# Patient Record
Sex: Female | Born: 1945 | Race: Black or African American | Hispanic: No | Smoking: Never smoker
Health system: Southern US, Community
[De-identification: ages and names within clinical notes are randomized; demographics above are authoritative.]

## PROBLEM LIST (undated history)

## (undated) DIAGNOSIS — S86811D Strain of other muscle(s) and tendon(s) at lower leg level, right leg, subsequent encounter: Secondary | ICD-10-CM

## (undated) DIAGNOSIS — F329 Major depressive disorder, single episode, unspecified: Secondary | ICD-10-CM

## (undated) DIAGNOSIS — I10 Essential (primary) hypertension: Secondary | ICD-10-CM

## (undated) DIAGNOSIS — M1990 Unspecified osteoarthritis, unspecified site: Secondary | ICD-10-CM

## (undated) DIAGNOSIS — G629 Polyneuropathy, unspecified: Secondary | ICD-10-CM

## (undated) DIAGNOSIS — K649 Unspecified hemorrhoids: Secondary | ICD-10-CM

## (undated) DIAGNOSIS — K219 Gastro-esophageal reflux disease without esophagitis: Secondary | ICD-10-CM

## (undated) DIAGNOSIS — E7800 Pure hypercholesterolemia, unspecified: Secondary | ICD-10-CM

## (undated) DIAGNOSIS — D638 Anemia in other chronic diseases classified elsewhere: Secondary | ICD-10-CM

## (undated) DIAGNOSIS — Z87442 Personal history of urinary calculi: Secondary | ICD-10-CM

## (undated) DIAGNOSIS — F32A Depression, unspecified: Secondary | ICD-10-CM

## (undated) DIAGNOSIS — N289 Disorder of kidney and ureter, unspecified: Secondary | ICD-10-CM

## (undated) HISTORY — PX: CATARACT EXTRACTION: SUR2

## (undated) HISTORY — PX: ABDOMINAL HYSTERECTOMY: SHX81

## (undated) HISTORY — PX: BREAST SURGERY: SHX581

## (undated) HISTORY — DX: Strain of other muscle(s) and tendon(s) at lower leg level, right leg, subsequent encounter: S86.811D

## (undated) HISTORY — PX: OTHER SURGICAL HISTORY: SHX169

## (undated) HISTORY — PX: EYE SURGERY: SHX253

## (undated) HISTORY — PX: COLONOSCOPY: SHX174

## (undated) HISTORY — DX: Polyneuropathy, unspecified: G62.9

---
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
## 1993-09-14 HISTORY — PX: KNEE ARTHROSCOPY: SUR90

## 1998-02-26 ENCOUNTER — Emergency Department (HOSPITAL_COMMUNITY): Admission: EM | Admit: 1998-02-26 | Discharge: 1998-02-26 | Payer: Self-pay | Admitting: Emergency Medicine

## 1998-02-27 ENCOUNTER — Encounter: Admission: RE | Admit: 1998-02-27 | Discharge: 1998-05-28 | Payer: Self-pay | Admitting: Internal Medicine

## 1998-02-27 ENCOUNTER — Encounter: Admission: RE | Admit: 1998-02-27 | Discharge: 1998-02-27 | Payer: Self-pay | Admitting: *Deleted

## 1998-08-27 ENCOUNTER — Other Ambulatory Visit: Admission: RE | Admit: 1998-08-27 | Discharge: 1998-08-27 | Payer: Self-pay | Admitting: Obstetrics & Gynecology

## 1998-10-14 ENCOUNTER — Emergency Department (HOSPITAL_COMMUNITY): Admission: EM | Admit: 1998-10-14 | Discharge: 1998-10-14 | Payer: Self-pay | Admitting: Emergency Medicine

## 1998-10-17 ENCOUNTER — Emergency Department (HOSPITAL_COMMUNITY): Admission: EM | Admit: 1998-10-17 | Discharge: 1998-10-17 | Payer: Self-pay | Admitting: Emergency Medicine

## 1999-10-02 ENCOUNTER — Ambulatory Visit (HOSPITAL_COMMUNITY): Admission: RE | Admit: 1999-10-02 | Discharge: 1999-10-02 | Payer: Self-pay | Admitting: *Deleted

## 1999-10-02 ENCOUNTER — Encounter (INDEPENDENT_AMBULATORY_CARE_PROVIDER_SITE_OTHER): Payer: Self-pay | Admitting: Specialist

## 2003-01-15 ENCOUNTER — Encounter: Payer: Self-pay | Admitting: Emergency Medicine

## 2003-01-15 ENCOUNTER — Emergency Department (HOSPITAL_COMMUNITY): Admission: EM | Admit: 2003-01-15 | Discharge: 2003-01-16 | Payer: Self-pay | Admitting: Emergency Medicine

## 2003-11-05 ENCOUNTER — Other Ambulatory Visit: Admission: RE | Admit: 2003-11-05 | Discharge: 2003-11-05 | Payer: Self-pay | Admitting: Obstetrics & Gynecology

## 2004-02-17 ENCOUNTER — Emergency Department (HOSPITAL_COMMUNITY): Admission: EM | Admit: 2004-02-17 | Discharge: 2004-02-17 | Payer: Self-pay | Admitting: Emergency Medicine

## 2004-12-23 ENCOUNTER — Encounter: Admission: RE | Admit: 2004-12-23 | Discharge: 2004-12-23 | Payer: Self-pay | Admitting: Family Medicine

## 2006-05-19 ENCOUNTER — Encounter: Admission: RE | Admit: 2006-05-19 | Discharge: 2006-05-19 | Payer: Self-pay | Admitting: Rheumatology

## 2006-05-21 ENCOUNTER — Encounter: Admission: RE | Admit: 2006-05-21 | Discharge: 2006-05-21 | Payer: Self-pay | Admitting: Rheumatology

## 2006-12-30 ENCOUNTER — Ambulatory Visit (HOSPITAL_COMMUNITY): Admission: RE | Admit: 2006-12-30 | Discharge: 2006-12-30 | Payer: Self-pay | Admitting: Family Medicine

## 2009-04-11 ENCOUNTER — Ambulatory Visit (HOSPITAL_COMMUNITY): Admission: RE | Admit: 2009-04-11 | Discharge: 2009-04-11 | Payer: Self-pay | Admitting: Family Medicine

## 2010-04-01 ENCOUNTER — Other Ambulatory Visit: Admission: RE | Admit: 2010-04-01 | Discharge: 2010-04-01 | Payer: Self-pay | Admitting: Family Medicine

## 2010-04-30 ENCOUNTER — Ambulatory Visit (HOSPITAL_COMMUNITY): Admission: RE | Admit: 2010-04-30 | Discharge: 2010-04-30 | Payer: Self-pay | Admitting: Family Medicine

## 2010-05-12 ENCOUNTER — Emergency Department (HOSPITAL_COMMUNITY): Admission: EM | Admit: 2010-05-12 | Discharge: 2010-05-12 | Payer: Self-pay | Admitting: Emergency Medicine

## 2010-11-28 LAB — RPR
RPR Ser Ql: NONREACTIVE
RPR Ser Ql: NONREACTIVE

## 2010-11-28 LAB — WET PREP, GENITAL: Trich, Wet Prep: NONE SEEN

## 2010-11-28 LAB — URINE MICROSCOPIC-ADD ON

## 2010-11-28 LAB — URINALYSIS, ROUTINE W REFLEX MICROSCOPIC
Hgb urine dipstick: NEGATIVE
Nitrite: NEGATIVE
Protein, ur: NEGATIVE mg/dL
Specific Gravity, Urine: 1.031 — ABNORMAL HIGH (ref 1.005–1.030)
Urobilinogen, UA: 0.2 mg/dL (ref 0.0–1.0)

## 2010-11-28 LAB — GC/CHLAMYDIA PROBE AMP, GENITAL

## 2011-01-13 ENCOUNTER — Emergency Department (HOSPITAL_COMMUNITY): Payer: Medicare Other

## 2011-01-13 ENCOUNTER — Emergency Department (HOSPITAL_COMMUNITY)
Admission: EM | Admit: 2011-01-13 | Discharge: 2011-01-14 | Disposition: A | Discharge status: Home / Self Care | Payer: Medicare Other | Attending: Emergency Medicine | Admitting: Emergency Medicine

## 2011-01-13 DIAGNOSIS — R109 Unspecified abdominal pain: Secondary | ICD-10-CM | POA: Insufficient documentation

## 2011-01-13 DIAGNOSIS — R112 Nausea with vomiting, unspecified: Secondary | ICD-10-CM | POA: Insufficient documentation

## 2011-01-13 DIAGNOSIS — M129 Arthropathy, unspecified: Secondary | ICD-10-CM | POA: Insufficient documentation

## 2011-01-13 LAB — OCCULT BLOOD, POC DEVICE: Fecal Occult Bld: NEGATIVE

## 2011-01-14 LAB — URINALYSIS, ROUTINE W REFLEX MICROSCOPIC
Bilirubin Urine: NEGATIVE
Ketones, ur: 15 mg/dL — AB
Nitrite: NEGATIVE
Protein, ur: NEGATIVE mg/dL
Urobilinogen, UA: 0.2 mg/dL (ref 0.0–1.0)

## 2011-01-14 LAB — CBC
HCT: 40.8 % (ref 36.0–46.0)
MCHC: 33.8 g/dL (ref 30.0–36.0)
MCV: 94.9 fL (ref 78.0–100.0)
Platelets: 175 10*3/uL (ref 150–400)
RDW: 12.7 % (ref 11.5–15.5)

## 2011-01-14 LAB — COMPREHENSIVE METABOLIC PANEL
ALT: 12 U/L (ref 0–35)
Alkaline Phosphatase: 97 U/L (ref 39–117)
CO2: 24 mEq/L (ref 19–32)
Chloride: 100 mEq/L (ref 96–112)
GFR calc non Af Amer: 59 mL/min — ABNORMAL LOW (ref >60)
Glucose, Bld: 97 mg/dL (ref 70–99)
Potassium: 3.7 mEq/L (ref 3.5–5.1)
Sodium: 138 mEq/L (ref 135–145)
Total Bilirubin: 0.5 mg/dL (ref 0.3–1.2)
Total Protein: 8 g/dL (ref 6.0–8.3)

## 2011-01-14 LAB — DIFFERENTIAL
Eosinophils Absolute: 0.1 10*3/uL (ref 0.0–0.7)
Eosinophils Relative: 1 % (ref 0–5)
Lymphocytes Relative: 25 % (ref 12–46)
Lymphs Abs: 1.8 10*3/uL (ref 0.7–4.0)
Monocytes Absolute: 0.7 10*3/uL (ref 0.1–1.0)

## 2011-01-15 LAB — URINE CULTURE: Culture  Setup Time: 201205020855

## 2011-04-15 ENCOUNTER — Other Ambulatory Visit (HOSPITAL_COMMUNITY): Payer: Self-pay | Admitting: Family Medicine

## 2011-04-15 DIAGNOSIS — Z1231 Encounter for screening mammogram for malignant neoplasm of breast: Secondary | ICD-10-CM

## 2011-05-04 ENCOUNTER — Ambulatory Visit (HOSPITAL_COMMUNITY)
Admission: RE | Admit: 2011-05-04 | Discharge: 2011-05-04 | Disposition: A | Discharge status: Home / Self Care | Payer: Medicare Other | Source: Ambulatory Visit | Attending: Family Medicine | Admitting: Family Medicine

## 2011-05-04 DIAGNOSIS — Z1231 Encounter for screening mammogram for malignant neoplasm of breast: Secondary | ICD-10-CM | POA: Insufficient documentation

## 2014-04-10 ENCOUNTER — Other Ambulatory Visit: Payer: Self-pay | Admitting: Internal Medicine

## 2014-04-10 DIAGNOSIS — R1084 Generalized abdominal pain: Secondary | ICD-10-CM

## 2014-04-13 ENCOUNTER — Ambulatory Visit
Admission: RE | Admit: 2014-04-13 | Discharge: 2014-04-13 | Disposition: A | Discharge status: Home / Self Care | Payer: Commercial Managed Care - HMO | Source: Ambulatory Visit | Attending: Internal Medicine | Admitting: Internal Medicine

## 2014-04-13 DIAGNOSIS — R1084 Generalized abdominal pain: Secondary | ICD-10-CM

## 2014-04-13 MED ORDER — IOHEXOL 300 MG/ML  SOLN
100.0000 mL | Freq: Once | INTRAMUSCULAR | Status: AC | PRN
  Administered 2014-04-13: 100 mL via INTRAVENOUS

## 2014-04-25 ENCOUNTER — Other Ambulatory Visit: Payer: Self-pay | Admitting: Urology

## 2014-04-27 ENCOUNTER — Encounter (HOSPITAL_COMMUNITY): Payer: Self-pay | Admitting: Pharmacy Technician

## 2014-04-27 NOTE — Patient Instructions (Addendum)
TEAGHAN MELROSE  04/27/2014   Your procedure is scheduled on:  05/01/2014    Report to [facility].  Follow the Signs to [place] at   1100     am  Call this number if you have problems the morning of surgery: [phone]   Remember:   Do not eat food after midnite.  May have clear liquids until 0630am then npo.     CLEAR LIQUID DIET   Foods Allowed                                                                     Foods Excluded  Coffee and tea, regular and decaf                             liquids that you cannot  Plain Jell-O in any flavor                                             see through such as: Fruit ices (not with fruit pulp)                                     milk, soups, orange juice  Iced Popsicles                                    All solid food Carbonated beverages, regular and diet                                    Cranberry, grape and apple juices Sports drinks like Gatorade Lightly seasoned clear broth or consume(fat free) Sugar, honey syrup  Sample Menu Breakfast                                Lunch                                     Supper Cranberry juice                    Beef broth                            Chicken broth Jell-O                                     Grape juice                           Apple juice Coffee or tea  Jell-O                                      Popsicle                                                Coffee or tea                        Coffee or tea  _____________________________________________________________________    Take these medicines the morning of surgery with A SIP OF WATER:    Do not wear jewelry, make-up or nail polish.  Do not wear lotions, powders, or perfumes. , deodorant   Do not shave 48 hours prior to surgery.   Do not bring valuables to the hospital.  Contacts, dentures or bridgework may not be worn into surgery.       Patients discharged the day of  surgery will not be allowed to drive  home.  Name and phone number of your driver:      Please read over the following fact sheets that you were given: [facility] - Preparing for Surgery Before surgery, you can play an important role.  Because skin is not sterile, your skin needs to be as free of germs as possible.  You can reduce the number of germs on your skin by washing with CHG (chlorahexidine gluconate) soap before surgery.  CHG is an antiseptic cleaner which kills germs and bonds with the skin to continue killing germs even after washing. Please DO NOT use if you have an allergy to CHG or antibacterial soaps.  If your skin becomes reddened/irritated stop using the CHG and inform your nurse when you arrive at Short Stay. Do not shave (including legs and underarms) for at least 48 hours prior to the first CHG shower.  You may shave your face/neck. Please follow these instructions carefully:  1.  Shower with CHG Soap the night before surgery and the  morning of Surgery.  2.  If you choose to wash your hair, wash your hair first as usual with your  normal  shampoo.  3.  After you shampoo, rinse your hair and body thoroughly to remove the  shampoo.                           4.  Use CHG as you would any other liquid soap.  You can apply chg directly  to the skin and wash                       Gently with a scrungie or clean washcloth.  5.  Apply the CHG Soap to your body ONLY FROM THE NECK DOWN.   Do not use on face/ open                           Wound or open sores. Avoid contact with eyes, ears mouth and genitals (private parts).                       Wash face,  Genitals (private parts) with your normal soap.  6.  Wash thoroughly, paying special attention to the area where your surgery  will be performed.  7.  Thoroughly rinse your body with warm water from the neck down.  8.  DO NOT shower/wash with your normal soap after using and rinsing off  the CHG Soap.                9.   Pat yourself dry with a clean towel.            10.  Wear clean pajamas.            11.  Place clean sheets on your bed the night of your first shower and do not  sleep with pets. Day of Surgery : Do not apply any lotions/deodorants the morning of surgery.  Please wear clean clothes to the hospital/surgery center.  FAILURE TO FOLLOW THESE INSTRUCTIONS MAY RESULT IN THE CANCELLATION OF YOUR SURGERY PATIENT SIGNATURE_________________________________  NURSE SIGNATURE__________________________________  ________________________________________________________________________  coughing and deep breathing exercises, leg exercises

## 2014-04-30 ENCOUNTER — Encounter (HOSPITAL_COMMUNITY): Payer: Self-pay

## 2014-04-30 ENCOUNTER — Encounter (HOSPITAL_COMMUNITY)
Admission: RE | Admit: 2014-04-30 | Discharge: 2014-04-30 | Disposition: A | Discharge status: Home / Self Care | Payer: Medicare HMO | Source: Ambulatory Visit | Attending: Urology | Admitting: Urology

## 2014-04-30 DIAGNOSIS — Z01818 Encounter for other preprocedural examination: Secondary | ICD-10-CM | POA: Diagnosis present

## 2014-04-30 DIAGNOSIS — N329 Bladder disorder, unspecified: Secondary | ICD-10-CM | POA: Insufficient documentation

## 2014-04-30 DIAGNOSIS — N1330 Unspecified hydronephrosis: Secondary | ICD-10-CM | POA: Diagnosis present

## 2014-04-30 HISTORY — DX: Gastro-esophageal reflux disease without esophagitis: K21.9

## 2014-04-30 HISTORY — DX: Unspecified hemorrhoids: K64.9

## 2014-04-30 HISTORY — DX: Major depressive disorder, single episode, unspecified: F32.9

## 2014-04-30 HISTORY — DX: Unspecified osteoarthritis, unspecified site: M19.90

## 2014-04-30 HISTORY — DX: Depression, unspecified: F32.A

## 2014-04-30 LAB — BASIC METABOLIC PANEL
Anion gap: 8 (ref 5–15)
BUN: 16 mg/dL (ref 6–23)
CHLORIDE: 103 meq/L (ref 96–112)
CO2: 29 meq/L (ref 19–32)
CREATININE: 0.76 mg/dL (ref 0.50–1.10)
Calcium: 9.2 mg/dL (ref 8.4–10.5)
GFR calc Af Amer: >90 mL/min (ref >90)
GFR calc non Af Amer: 85 mL/min — ABNORMAL LOW (ref >90)
Glucose, Bld: 87 mg/dL (ref 70–99)
Potassium: 4.3 mEq/L (ref 3.7–5.3)
Sodium: 140 mEq/L (ref 137–147)

## 2014-04-30 LAB — CBC
HCT: 39.4 % (ref 36.0–46.0)
Hemoglobin: 12.7 g/dL (ref 12.0–15.0)
MCH: 31.7 pg (ref 26.0–34.0)
MCHC: 32.2 g/dL (ref 30.0–36.0)
MCV: 98.3 fL (ref 78.0–100.0)
PLATELETS: 229 10*3/uL (ref 150–400)
RBC: 4.01 MIL/uL (ref 3.87–5.11)
RDW: 13.6 % (ref 11.5–15.5)
WBC: 6 10*3/uL (ref 4.0–10.5)

## 2014-04-30 NOTE — Progress Notes (Signed)
At time of preop appointment patient was unsure of allergies.  Called and received last office visit note from PCP- Dr Ashby Dawes- by note placed allergies in EPIC.

## 2014-04-30 NOTE — H&P (Signed)
Active Problems Problems  1. Abdominal pain, LLQ (left lower quadrant) (789.04) 2. Bladder mass (596.89) 3. Hydronephrosis, right (591) 4. Pyuria (791.9) 5. Urge incontinence of urine (788.31)  History of Present Illness Brittany Stark returns today after Dr. Ashby Dawes obtained a CT for recurrent abdominal pain that showed mild right hydronephrosis with a thickening of the right bladder wall with perivesical inflammatory changes. When I saw her about 2 weeks ago she had been on antibiotics and her symptoms and microhematuria had resolved.  She now has pain that moves from the left to the right suprapubic area.  She has TNTC WBC's on her UA today but no fecaluria or hematuria.  She reports a recent BM where she fell the stool was coming from the vagina, but none since.  She has had no gross hematuria.  She is on Humira and Prednisone for RA.   Past Medical History Problems  1. History of depression (V11.8) 2. History of esophageal reflux (V12.79) 3. History of hypercholesterolemia (V12.29) 4. History of hypertension (V12.59) 5. History of osteoporosis (V13.59) 6. History of rheumatoid arthritis (V13.4)  Surgical History Problems  1. History of Breast Surgery Reduction Procedure Bilateral 2. History of Cataract Surgery 3. History of Complete Colonoscopy 4. History of Complete Colonoscopy For Polyp Removal 5. History of Hysterectomy 6. History of Knee Arthroscopy 7. History of Tonsillectomy  Current Meds 1. Aspirin 81 MG Oral Tablet;  Therapy: (Recorded:12Aug2015) to Recorded 2. Ciprofloxacin HCl - 500 MG Oral Tablet; Take 1 tablet twice daily;  Therapy: 12Aug2015 to (Complete:19Aug2015)  Requested for: 12Aug2015; Last  Rx:12Aug2015 Ordered 3. Fluconazole 100 MG Oral Tablet;  Therapy: [id number] to Recorded 4. Folic Acid 544 MCG Oral Tablet;  Therapy: (Recorded:12Aug2015) to Recorded 5. Humira 40 MG/0.8ML KIT;  Therapy: (Recorded:15Jul2015) to Recorded 6. Humira Pen 40  MG/0.8ML Subcutaneous Pen-injector Kit;  Therapy: [id number] to Recorded 7. Methotrexate 2.5 MG Oral Tablet;  Therapy: (Recorded:15Jul2015) to Recorded 8. MetroNIDAZOLE 500 MG Oral Tablet; TAKE 1 TABLET TWICE DAILY UNTIL FINISHED;  Therapy: 12Aug2015 to (Evaluate:22Aug2015)  Requested for: 12Aug2015; Last  Rx:12Aug2015 Ordered 9. Multi-Vitamin TABS;  Therapy: (Recorded:15Jul2015) to Recorded 10. Muscle Rub CREA;   Therapy: (Recorded:12Aug2015) to Recorded 11. Omeprazole 20 MG Oral Tablet Delayed Release;   Therapy: (Recorded:15Jul2015) to Recorded 12. Oyster Calcium + D TABS;   Therapy: (Recorded:12Aug2015) to Recorded 13. PredniSONE (Pak) 5 MG Oral Tablet;   Therapy: [id number] to Recorded 14. PredniSONE 5 MG Oral Tablet;   Therapy: (Recorded:15Jul2015) to Recorded  Allergies Medication  1. Amoxicillin TABS 2. Arava 3. Doxycycline Hyclate CAPS  Family History Problems  1. Family history of hypertension (V17.49) : Father 2. Family history of malignant neoplasm (V16.9) : Mother 3. Family history of stroke (V17.1) : Father 4. Family history of systemic lupus erythematosus (V19.4) : Sister   2 sisters  Social History Problems  1. Denied: History of Alcohol use 2. Caffeine use (V49.89) 3. Divorced 4. Never a smoker 5. Number of children   1 son   1 daughter 1. Retired   retired [employer] manager  Review of Systems  Genitourinary: no hematuria.  Gastrointestinal: abdominal pain, but no constipation.  Constitutional: no fever.    Vitals Vital Signs [Data Includes: Last 1 Day]  Recorded: 12Aug2015 09:45AM  Height: 5 ft 9 in Weight: 170 lb  BMI Calculated: 25.1 BSA Calculated: 1.93 Blood Pressure: 147 / 89 Temperature: 97.4 F Heart Rate: 82  Physical Exam Constitutional: Well nourished and well developed . No acute distress.  Pulmonary: No respiratory distress  and normal respiratory rhythm and effort.  Cardiovascular: Heart rate and rhythm are normal . No peripheral edema.   Abdomen: The abdomen is soft and nontender (with the exception of at most mild tenderness in the right SP area. ). No masses are palpated. No CVA tenderness. No hernias are palpable. No hepatosplenomegaly noted.  Genitourinary:. Surprisingly no pelvic mass or induration is noted.  Chaperone Present: .  Examination of the external genitalia shows normal female external genitalia and no lesions. The urethra is normal in appearance and not tender. There is no urethral mass. Vaginal exam demonstrates atrophy and the vaginal epithelium to be poorly estrogenized, but no tenderness. No cystocele is identified. No rectocele is identified. The cervix is is absent. The uterus is absent. The adnexa are palpably normal. The bladder is normal on palpation, non tender, not distended and without masses. The anus is normal on inspection. The perineum is normal on inspection.    Results/Data Urine [Data Includes: Last 1 Day]   12Aug2015  COLOR YELLOW   APPEARANCE CLOUDY   SPECIFIC GRAVITY 1.010   pH 6.5   GLUCOSE NEG mg/dL  BILIRUBIN NEG   KETONE NEG mg/dL  BLOOD TRACE   PROTEIN NEG mg/dL  UROBILINOGEN 0.2 mg/dL  NITRITE POS   LEUKOCYTE ESTERASE SMALL   SQUAMOUS EPITHELIAL/HPF FEW   WBC TNTC WBC/hpf  RBC 0-2 RBC/hpf  BACTERIA MANY   CRYSTALS NONE SEEN   CASTS NONE SEEN    The following images/tracing/specimen were independently visualized:  I have reviewed her CT films and report.  The following clinical lab reports were reviewed:  UA reviewed.    Assessment Assessed  1. Bladder mass (596.89) 2. Hydronephrosis, right (591) 3. Pyuria (791.9) 4. Abdominal pain, LLQ (left lower quadrant) (789.04)  She has right hydro with a mass effect/thickening of the right bladder wall with perivesical inflammatory changes that abutt the small bowel.  Her urine appears infected today.  She has no pneumaturia or fecaluria to suggest a fistula but I am concerned about a perivesical inflammatory process that  maybe somewhat muted by the Humira and Prednisone.   Plan Urine culture today.  I am going to get her set up for an outpatient cystoscopy with right retrograde and possible stenting.   A GI or general surgery consult may be required depending on my findings.   Discussion/Summary CC :Dr. Merrilee Seashore.

## 2014-04-30 NOTE — Progress Notes (Signed)
Requested list of allergies  From Dr Ashby Dawes office.

## 2014-05-01 ENCOUNTER — Encounter (HOSPITAL_COMMUNITY)
Admission: RE | Disposition: A | Discharge status: Home / Self Care | Payer: Self-pay | Source: Ambulatory Visit | Attending: Urology

## 2014-05-01 ENCOUNTER — Encounter (HOSPITAL_COMMUNITY): Payer: Medicare HMO | Admitting: Anesthesiology

## 2014-05-01 ENCOUNTER — Ambulatory Visit (HOSPITAL_COMMUNITY)
Admission: RE | Admit: 2014-05-01 | Discharge: 2014-05-01 | Disposition: A | Discharge status: Home / Self Care | Payer: Medicare HMO | Source: Ambulatory Visit | Attending: Urology | Admitting: Urology

## 2014-05-01 ENCOUNTER — Encounter (HOSPITAL_COMMUNITY): Payer: Self-pay | Admitting: *Deleted

## 2014-05-01 ENCOUNTER — Ambulatory Visit (HOSPITAL_COMMUNITY): Payer: Medicare HMO | Admitting: Anesthesiology

## 2014-05-01 DIAGNOSIS — N3090 Cystitis, unspecified without hematuria: Secondary | ICD-10-CM | POA: Diagnosis present

## 2014-05-01 DIAGNOSIS — Z7982 Long term (current) use of aspirin: Secondary | ICD-10-CM | POA: Insufficient documentation

## 2014-05-01 DIAGNOSIS — IMO0002 Reserved for concepts with insufficient information to code with codable children: Secondary | ICD-10-CM | POA: Insufficient documentation

## 2014-05-01 DIAGNOSIS — Z79899 Other long term (current) drug therapy: Secondary | ICD-10-CM | POA: Insufficient documentation

## 2014-05-01 DIAGNOSIS — N329 Bladder disorder, unspecified: Secondary | ICD-10-CM | POA: Insufficient documentation

## 2014-05-01 DIAGNOSIS — M069 Rheumatoid arthritis, unspecified: Secondary | ICD-10-CM | POA: Insufficient documentation

## 2014-05-01 DIAGNOSIS — F329 Major depressive disorder, single episode, unspecified: Secondary | ICD-10-CM | POA: Diagnosis not present

## 2014-05-01 DIAGNOSIS — F3289 Other specified depressive episodes: Secondary | ICD-10-CM | POA: Diagnosis not present

## 2014-05-01 DIAGNOSIS — K219 Gastro-esophageal reflux disease without esophagitis: Secondary | ICD-10-CM | POA: Diagnosis not present

## 2014-05-01 DIAGNOSIS — N1330 Unspecified hydronephrosis: Secondary | ICD-10-CM | POA: Diagnosis present

## 2014-05-01 HISTORY — PX: CYSTOSCOPY WITH BIOPSY: SHX5122

## 2014-05-01 HISTORY — PX: CYSTOSCOPY W/ RETROGRADES: SHX1426

## 2014-05-01 SURGERY — CYSTOSCOPY, WITH RETROGRADE PYELOGRAM
Anesthesia: General | Site: Ureter | Laterality: Right

## 2014-05-01 MED ORDER — PROPOFOL 10 MG/ML IV BOLUS
INTRAVENOUS | Status: DC | PRN
  Administered 2014-05-01: 150 mg via INTRAVENOUS

## 2014-05-01 MED ORDER — OXYCODONE HCL 5 MG/5ML PO SOLN: 5.0000 mg | Freq: Once | ORAL | Status: DC | PRN

## 2014-05-01 MED ORDER — HYDROMORPHONE HCL PF 1 MG/ML IJ SOLN
0.2500 mg | INTRAMUSCULAR | Status: DC | PRN
Start: 2014-05-01 — End: 2014-05-01

## 2014-05-01 MED ORDER — FENTANYL CITRATE 0.05 MG/ML IJ SOLN
INTRAMUSCULAR | Status: AC
  Filled 2014-05-01: qty 2

## 2014-05-01 MED ORDER — PROPOFOL 10 MG/ML IV BOLUS
INTRAVENOUS | Status: AC
  Filled 2014-05-01: qty 20

## 2014-05-01 MED ORDER — ACETAMINOPHEN 650 MG RE SUPP
650.0000 mg | RECTAL | Status: DC | PRN
Start: 2014-05-01 — End: 2014-05-01
  Filled 2014-05-01: qty 1

## 2014-05-01 MED ORDER — FENTANYL CITRATE 0.05 MG/ML IJ SOLN
INTRAMUSCULAR | Status: DC | PRN
  Administered 2014-05-01: 50 ug via INTRAVENOUS

## 2014-05-01 MED ORDER — SODIUM CHLORIDE 0.9 % IJ SOLN: 3.0000 mL | Freq: Two times a day (BID) | INTRAMUSCULAR | Status: DC

## 2014-05-01 MED ORDER — PROMETHAZINE HCL 25 MG/ML IJ SOLN
6.2500 mg | INTRAMUSCULAR | Status: DC | PRN
Start: 2014-05-01 — End: 2014-05-01

## 2014-05-01 MED ORDER — SODIUM CHLORIDE 0.9 % IV SOLN: 250.0000 mL | INTRAVENOUS | Status: DC | PRN

## 2014-05-01 MED ORDER — CIPROFLOXACIN HCL 500 MG PO TABS: 500.0000 mg | ORAL_TABLET | Freq: Two times a day (BID) | ORAL | Status: DC

## 2014-05-01 MED ORDER — MIDAZOLAM HCL 2 MG/2ML IJ SOLN
INTRAMUSCULAR | Status: AC
  Filled 2014-05-01: qty 2

## 2014-05-01 MED ORDER — IOHEXOL 300 MG/ML  SOLN
INTRAMUSCULAR | Status: DC | PRN
  Administered 2014-05-01: 10 mL via INTRAVENOUS

## 2014-05-01 MED ORDER — OXYCODONE HCL 5 MG PO TABS: 5.0000 mg | ORAL_TABLET | ORAL | Status: DC | PRN

## 2014-05-01 MED ORDER — MEPERIDINE HCL 50 MG/ML IJ SOLN: 6.2500 mg | INTRAMUSCULAR | Status: DC | PRN

## 2014-05-01 MED ORDER — STERILE WATER FOR IRRIGATION IR SOLN
Status: DC | PRN
  Administered 2014-05-01: 3000 mL

## 2014-05-01 MED ORDER — CIPROFLOXACIN IN D5W 400 MG/200ML IV SOLN
INTRAVENOUS | Status: AC
  Filled 2014-05-01: qty 200

## 2014-05-01 MED ORDER — SODIUM CHLORIDE 0.9 % IJ SOLN: 3.0000 mL | INTRAMUSCULAR | Status: DC | PRN

## 2014-05-01 MED ORDER — LACTATED RINGERS IV SOLN
INTRAVENOUS | Status: DC
  Administered 2014-05-01: 1000 mL via INTRAVENOUS

## 2014-05-01 MED ORDER — FENTANYL CITRATE 0.05 MG/ML IJ SOLN: 25.0000 ug | INTRAMUSCULAR | Status: DC | PRN

## 2014-05-01 MED ORDER — OXYCODONE HCL 5 MG PO TABS: 5.0000 mg | ORAL_TABLET | Freq: Once | ORAL | Status: DC | PRN

## 2014-05-01 MED ORDER — CIPROFLOXACIN IN D5W 400 MG/200ML IV SOLN
400.0000 mg | INTRAVENOUS | Status: AC
  Administered 2014-05-01: 400 mg via INTRAVENOUS

## 2014-05-01 MED ORDER — MIDAZOLAM HCL 5 MG/5ML IJ SOLN
INTRAMUSCULAR | Status: DC | PRN
  Administered 2014-05-01: 2 mg via INTRAVENOUS

## 2014-05-01 MED ORDER — ACETAMINOPHEN 325 MG PO TABS
650.0000 mg | ORAL_TABLET | ORAL | Status: DC | PRN
Start: 2014-05-01 — End: 2014-05-01

## 2014-05-01 SURGICAL SUPPLY — 15 items
BAG URO CATCHER STRL LF (DRAPE) ×3 IMPLANT
CATH URET 5FR 28IN OPEN ENDED (CATHETERS) ×3 IMPLANT
CLOTH BEACON ORANGE TIMEOUT ST (SAFETY) ×3 IMPLANT
DRAPE CAMERA CLOSED 9X96 (DRAPES) ×3 IMPLANT
ELECT REM PT RETURN 9FT ADLT (ELECTROSURGICAL) ×3
ELECTRODE REM PT RTRN 9FT ADLT (ELECTROSURGICAL) ×2 IMPLANT
GLOVE SURG SS PI 6.0 STRL IVOR (GLOVE) ×3 IMPLANT
GLOVE SURG SS PI 8.0 STRL IVOR (GLOVE) ×3 IMPLANT
GOWN STRL REUS W/ TWL LRG LVL3 (GOWN DISPOSABLE) ×4 IMPLANT
GOWN STRL REUS W/TWL LRG LVL3 (GOWN DISPOSABLE) ×6
GOWN STRL REUS W/TWL XL LVL3 (GOWN DISPOSABLE) ×3 IMPLANT
MANIFOLD NEPTUNE II (INSTRUMENTS) ×3 IMPLANT
PACK CYSTO (CUSTOM PROCEDURE TRAY) ×3 IMPLANT
TUBING CONNECTING 10 (TUBING) ×3 IMPLANT
WATER STERILE IRR 3000ML UROMA (IV SOLUTION) ×3 IMPLANT

## 2014-05-01 NOTE — Interval H&P Note (Signed)
History and Physical Interval Note:  Her culture was positive but sensitive to the Cipro she was given.   05/01/2014 12:23 PM  [name]  has presented today for surgery, with the diagnosis of bladder lesions and right hydronephrosis  The various methods of treatment have been discussed with the patient and family. After consideration of risks, benefits and other options for treatment, the patient has consented to  Procedure(s): CYSTOSCOPY WITH RETROGRADE PYELOGRAM/POSSIBLE BIOPSY/POSSIBLE RIGHT STENT (Right) as a surgical intervention .  The patient's history has been reviewed, patient examined, no change in status, stable for surgery.  I have reviewed the patient's chart and labs.  Questions were answered to the patient's satisfaction.     Brittany Stark J

## 2014-05-01 NOTE — Anesthesia Preprocedure Evaluation (Signed)
Anesthesia Evaluation  Patient identified by MRN, date of birth, ID band Patient awake    Reviewed: Allergy & Precautions, H&P , NPO status , Patient's Chart, lab work & pertinent test results  Airway Mallampati: II TM Distance: >3 FB Neck ROM: Full    Dental no notable dental hx.    Pulmonary neg pulmonary ROS,  breath sounds clear to auscultation  Pulmonary exam normal       Cardiovascular negative cardio ROS  Rhythm:Regular Rate:Normal     Neuro/Psych PSYCHIATRIC DISORDERS Depression negative neurological ROS     GI/Hepatic Neg liver ROS, GERD-  Medicated,  Endo/Other  negative endocrine ROS  Renal/GU negative Renal ROS     Musculoskeletal negative musculoskeletal ROS (+)   Abdominal   Peds  Hematology negative hematology ROS (+)   Anesthesia Other Findings   Reproductive/Obstetrics negative OB ROS                           Anesthesia Physical Anesthesia Plan  ASA: II  Anesthesia Plan: General   Post-op Pain Management:    Induction: Intravenous  Airway Management Planned: LMA  Additional Equipment:   Intra-op Plan:   Post-operative Plan: Extubation in OR  Informed Consent: I have reviewed the patients History and Physical, chart, labs and discussed the procedure including the risks, benefits and alternatives for the proposed anesthesia with the patient or authorized representative who has indicated his/her understanding and acceptance.   Dental advisory given  Plan Discussed with: CRNA  Anesthesia Plan Comments:         Anesthesia Quick Evaluation

## 2014-05-01 NOTE — Discharge Instructions (Signed)
Cystoscopy, Care After Refer to this sheet in the next few weeks. These instructions provide you with information on caring for yourself after your procedure. Your caregiver may also give you more specific instructions. Your treatment has been planned according to current medical practices, but problems sometimes occur. Call your caregiver if you have any problems or questions after your procedure. HOME CARE INSTRUCTIONS  Things you can do to ease any discomfort after your procedure include:  Drinking enough water and fluids to keep your urine clear or pale yellow.  Taking a warm bath to relieve any burning feelings. SEEK IMMEDIATE MEDICAL CARE IF:   You have an increase in blood in your urine.  You notice blood clots in your urine.  You have difficulty passing urine.  You have the chills.  You have abdominal pain.  You have a fever or persistent symptoms for more than 2-3 days.  You have a fever and your symptoms suddenly get worse. MAKE SURE YOU:   Understand these instructions.  Will watch your condition.  Will get help right away if you are not doing well or get worse. Document Released: 03/20/2005 Document Revised: 05/03/2013 Document Reviewed: 02/22/2012 [facility] Patient Information 2015 [place], [place]. This information is not intended to replace advice given to you by your health care provider. Make sure you discuss any questions you have with your health care provider.  I have extended your script for Cipro for another 3 weeks.

## 2014-05-01 NOTE — Transfer of Care (Signed)
Immediate Anesthesia Transfer of Care Note  Patient: Brittany Stark  Procedure(s) Performed: Procedure(s): CYSTOSCOPY WITH RIGHT RETROGRADE PYELOGRAM (Right) CYSTOSCOPY WITH bladder BIOPSY  Patient Location: PACU  Anesthesia Type:General  Level of Consciousness: awake, sedated and patient cooperative  Airway & Oxygen Therapy: Patient Spontanous Breathing and Patient connected to face mask oxygen  Post-op Assessment: Report given to PACU RN and Post -op Vital signs reviewed and stable  Post vital signs: Reviewed and stable  Complications: No apparent anesthesia complications

## 2014-05-01 NOTE — Brief Op Note (Signed)
05/01/2014  1:20 PM  PATIENT:  Brittany Stark  68 y.o. female  PRE-OPERATIVE DIAGNOSIS:  bladder lesion and right hydronephrosis  POST-OPERATIVE DIAGNOSIS:  bladder lesion and right hydronephrosis  PROCEDURE:  Procedure(s): CYSTOSCOPY WITH RIGHT RETROGRADE PYELOGRAM (Right) CYSTOSCOPY WITH bladder BIOPSY  SURGEON:  Surgeon(s) and Role:    * Malka So, MD - Primary  PHYSICIAN ASSISTANT:   ASSISTANTS: none   ANESTHESIA:   general  EBL:     BLOOD ADMINISTERED:none  DRAINS: none   LOCAL MEDICATIONS USED:  NONE  SPECIMEN:  Source of Specimen:  right posterior bladder wall.   DISPOSITION OF SPECIMEN:  PATHOLOGY  COUNTS:  YES  TOURNIQUET:  * No tourniquets in log *  DICTATION: .Other Dictation: Dictation Number [id number]  PLAN OF CARE: Discharge to home after PACU  PATIENT DISPOSITION:  PACU - hemodynamically stable.   Delay start of Pharmacological VTE agent (>24hrs) due to surgical blood loss or risk of bleeding: not applicable

## 2014-05-01 NOTE — Anesthesia Postprocedure Evaluation (Signed)
Anesthesia Post Note  Patient: Brittany Stark  Procedure(s) Performed: Procedure(s) (LRB): CYSTOSCOPY WITH RIGHT RETROGRADE PYELOGRAM (Right) CYSTOSCOPY WITH bladder BIOPSY  Anesthesia type: General  Patient location: PACU  Post pain: Pain level controlled  Post assessment: Post-op Vital signs reviewed  Last Vitals: BP 131/88  Pulse 80  Temp(Src) 36.5 C (Oral)  Resp 23  SpO2 100%  Post vital signs: Reviewed  Level of consciousness: sedated  Complications: No apparent anesthesia complications

## 2014-05-02 ENCOUNTER — Encounter (HOSPITAL_COMMUNITY): Payer: Self-pay | Admitting: Urology

## 2014-05-02 NOTE — Op Note (Signed)
Brittany Stark, Brittany Stark                ACCOUNT NO.:  [id number]  MEDICAL RECORD NO.:  [id number]  LOCATION:                                 FACILITY:  PHYSICIAN:  Marshall Cork. Jeffie Pollock, M.D.    DATE OF BIRTH:  [birth year]  DATE OF PROCEDURE:  05/01/2014 DATE OF DISCHARGE:  05/01/2014                              OPERATIVE REPORT   PROCEDURE:  Cystoscopy with right retrograde pyelogram and interpretation and bladder biopsy with fulguration.  PREOPERATIVE DIAGNOSIS:  Right hydronephrosis with bladder wall lesion.  POSTOPERATIVE DIAGNOSIS:  No hydronephrosis identified.  Bladder wall lesion was minimal.  SURGEON:  Marshall Cork. Jeffie Pollock, M.D.  ANESTHESIA:  General.  SPECIMEN:  Bladder biopsy from the right posterior wall.  DRAINS:  None.  BLOOD LOSS:  None.  COMPLICATIONS:  None.  INDICATIONS:  This woman is a 68 year old African American female, who recently presented with left lower quadrant pain and microhematuria. She was initially treated with antibiotics with resolution of the hematuria and her symptoms, however, 2 weeks after completing antibiotics, her symptoms recurred along with some discomfort on the right side as well.  A CT scan revealed mild hydronephrosis with thickening of the right lateral, anterior, and posterior bladder wall with some perivesical inflammatory changes.  She was seen back in the office and had an urinary tract infection that was sensitive to Cipro. She had been placed on Cipro and Flagyl because of concern of about possible bowel fistula she was left on both once the culture returned. She presents today to undergo cystoscopy with retrograde pyelogram to further evaluate the findings.  FINDINGS OF PROCEDURE:  She was given Cipro.  She was taken to the operating room where general anesthetic was induced.  She was placed in lithotomy position and fitted with PAS hose.  Her perineum and genitalia were prepped with Betadine solution.  She was draped in usual  sterile fashion.  Cystoscopy was performed using a 22-French scope and 12-degree lens. Examination revealed a normal urethra.  The bladder wall was generally smooth and pale with only mild erythema on the right posterior wall but no papillary lesions or herald patches were noted.  The ureteral orifices were unremarkable.  The right ureteral orifice was cannulated with a 5-French open-end catheter.  There was at most minor narrowing of the intramural ureter but delicate proximal ureter and intrarenal collecting system were noted.  Upon removal of the ureteral catheter, there was brisk prompt efflux of the contrast and the kidney continued to decompress through the procedure.  Once retrograde pyelogram had been performed, a cup biopsy was used to perform a biopsy of the slight erythematous mucosa on the right posterior wall.  The biopsy site was then fulgurated with a Bugbee electrode.  It was not felt that catheter drainage was indicated after inspection of the biopsy bed.  At this point, her bladder was drained. She was taken down from lithotomy position.  Her anesthetic was reversed.  She was moved to recovery room in stable condition.  She will be kept on Cipro and will follow up as scheduled.     Marshall Cork. Jeffie Pollock, M.D.     JJW/MEDQ  D:  05/01/2014  T:  05/01/2014  Job:  [id number]

## 2014-10-03 DIAGNOSIS — E782 Mixed hyperlipidemia: Secondary | ICD-10-CM | POA: Diagnosis not present

## 2014-10-03 DIAGNOSIS — R350 Frequency of micturition: Secondary | ICD-10-CM | POA: Diagnosis not present

## 2014-10-03 DIAGNOSIS — I10 Essential (primary) hypertension: Secondary | ICD-10-CM | POA: Diagnosis not present

## 2014-10-03 DIAGNOSIS — R5381 Other malaise: Secondary | ICD-10-CM | POA: Diagnosis not present

## 2014-10-11 DIAGNOSIS — G5602 Carpal tunnel syndrome, left upper limb: Secondary | ICD-10-CM | POA: Diagnosis not present

## 2014-10-11 DIAGNOSIS — I10 Essential (primary) hypertension: Secondary | ICD-10-CM | POA: Diagnosis not present

## 2014-10-11 DIAGNOSIS — E782 Mixed hyperlipidemia: Secondary | ICD-10-CM | POA: Diagnosis not present

## 2014-10-11 DIAGNOSIS — M810 Age-related osteoporosis without current pathological fracture: Secondary | ICD-10-CM | POA: Diagnosis not present

## 2014-10-11 DIAGNOSIS — Z0000 Encounter for general adult medical examination without abnormal findings: Secondary | ICD-10-CM | POA: Diagnosis not present

## 2014-10-11 DIAGNOSIS — F5101 Primary insomnia: Secondary | ICD-10-CM | POA: Diagnosis not present

## 2014-12-05 DIAGNOSIS — M059 Rheumatoid arthritis with rheumatoid factor, unspecified: Secondary | ICD-10-CM | POA: Diagnosis not present

## 2014-12-05 DIAGNOSIS — Z79899 Other long term (current) drug therapy: Secondary | ICD-10-CM | POA: Diagnosis not present

## 2014-12-05 DIAGNOSIS — M2550 Pain in unspecified joint: Secondary | ICD-10-CM | POA: Diagnosis not present

## 2014-12-05 DIAGNOSIS — M810 Age-related osteoporosis without current pathological fracture: Secondary | ICD-10-CM | POA: Diagnosis not present

## 2014-12-05 DIAGNOSIS — R202 Paresthesia of skin: Secondary | ICD-10-CM | POA: Diagnosis not present

## 2014-12-28 DIAGNOSIS — E782 Mixed hyperlipidemia: Secondary | ICD-10-CM | POA: Diagnosis not present

## 2014-12-28 DIAGNOSIS — I10 Essential (primary) hypertension: Secondary | ICD-10-CM | POA: Diagnosis not present

## 2014-12-28 DIAGNOSIS — G5602 Carpal tunnel syndrome, left upper limb: Secondary | ICD-10-CM | POA: Diagnosis not present

## 2015-02-04 DIAGNOSIS — G5602 Carpal tunnel syndrome, left upper limb: Secondary | ICD-10-CM | POA: Diagnosis not present

## 2015-02-04 DIAGNOSIS — Z79899 Other long term (current) drug therapy: Secondary | ICD-10-CM | POA: Diagnosis not present

## 2015-02-04 DIAGNOSIS — M059 Rheumatoid arthritis with rheumatoid factor, unspecified: Secondary | ICD-10-CM | POA: Diagnosis not present

## 2015-02-04 DIAGNOSIS — M2550 Pain in unspecified joint: Secondary | ICD-10-CM | POA: Diagnosis not present

## 2015-04-30 DIAGNOSIS — M059 Rheumatoid arthritis with rheumatoid factor, unspecified: Secondary | ICD-10-CM | POA: Diagnosis not present

## 2015-04-30 DIAGNOSIS — R490 Dysphonia: Secondary | ICD-10-CM | POA: Diagnosis not present

## 2015-04-30 DIAGNOSIS — R1312 Dysphagia, oropharyngeal phase: Secondary | ICD-10-CM | POA: Diagnosis not present

## 2015-04-30 DIAGNOSIS — N3289 Other specified disorders of bladder: Secondary | ICD-10-CM | POA: Diagnosis not present

## 2015-05-06 DIAGNOSIS — M1990 Unspecified osteoarthritis, unspecified site: Secondary | ICD-10-CM | POA: Diagnosis not present

## 2015-05-06 DIAGNOSIS — M059 Rheumatoid arthritis with rheumatoid factor, unspecified: Secondary | ICD-10-CM | POA: Diagnosis not present

## 2015-05-06 DIAGNOSIS — R490 Dysphonia: Secondary | ICD-10-CM | POA: Diagnosis not present

## 2015-05-21 DIAGNOSIS — R490 Dysphonia: Secondary | ICD-10-CM | POA: Diagnosis not present

## 2015-05-21 DIAGNOSIS — K219 Gastro-esophageal reflux disease without esophagitis: Secondary | ICD-10-CM | POA: Diagnosis not present

## 2015-06-04 DIAGNOSIS — R490 Dysphonia: Secondary | ICD-10-CM | POA: Diagnosis not present

## 2015-06-04 DIAGNOSIS — M059 Rheumatoid arthritis with rheumatoid factor, unspecified: Secondary | ICD-10-CM | POA: Diagnosis not present

## 2015-06-04 DIAGNOSIS — Z23 Encounter for immunization: Secondary | ICD-10-CM | POA: Diagnosis not present

## 2015-06-04 DIAGNOSIS — M1990 Unspecified osteoarthritis, unspecified site: Secondary | ICD-10-CM | POA: Diagnosis not present

## 2015-06-10 DIAGNOSIS — N3941 Urge incontinence: Secondary | ICD-10-CM | POA: Diagnosis not present

## 2015-06-10 DIAGNOSIS — R351 Nocturia: Secondary | ICD-10-CM | POA: Diagnosis not present

## 2015-06-18 DIAGNOSIS — K219 Gastro-esophageal reflux disease without esophagitis: Secondary | ICD-10-CM | POA: Diagnosis not present

## 2015-06-18 DIAGNOSIS — R490 Dysphonia: Secondary | ICD-10-CM | POA: Diagnosis not present

## 2015-06-21 DIAGNOSIS — E782 Mixed hyperlipidemia: Secondary | ICD-10-CM | POA: Diagnosis not present

## 2015-06-21 DIAGNOSIS — I10 Essential (primary) hypertension: Secondary | ICD-10-CM | POA: Diagnosis not present

## 2015-06-28 DIAGNOSIS — I10 Essential (primary) hypertension: Secondary | ICD-10-CM | POA: Diagnosis not present

## 2015-06-28 DIAGNOSIS — M1990 Unspecified osteoarthritis, unspecified site: Secondary | ICD-10-CM | POA: Diagnosis not present

## 2015-06-28 DIAGNOSIS — R600 Localized edema: Secondary | ICD-10-CM | POA: Diagnosis not present

## 2015-06-28 DIAGNOSIS — E782 Mixed hyperlipidemia: Secondary | ICD-10-CM | POA: Diagnosis not present

## 2015-06-28 DIAGNOSIS — M810 Age-related osteoporosis without current pathological fracture: Secondary | ICD-10-CM | POA: Diagnosis not present

## 2015-07-05 ENCOUNTER — Other Ambulatory Visit: Payer: Self-pay

## 2015-07-05 DIAGNOSIS — Z1231 Encounter for screening mammogram for malignant neoplasm of breast: Secondary | ICD-10-CM

## 2015-07-16 DIAGNOSIS — R490 Dysphonia: Secondary | ICD-10-CM | POA: Diagnosis not present

## 2015-07-16 DIAGNOSIS — K219 Gastro-esophageal reflux disease without esophagitis: Secondary | ICD-10-CM | POA: Diagnosis not present

## 2015-07-23 ENCOUNTER — Ambulatory Visit
Admission: RE | Admit: 2015-07-23 | Discharge: 2015-07-23 | Disposition: A | Discharge status: Home / Self Care | Payer: Commercial Managed Care - HMO | Source: Ambulatory Visit

## 2015-07-23 DIAGNOSIS — Z1231 Encounter for screening mammogram for malignant neoplasm of breast: Secondary | ICD-10-CM

## 2015-08-27 DIAGNOSIS — K219 Gastro-esophageal reflux disease without esophagitis: Secondary | ICD-10-CM | POA: Diagnosis not present

## 2015-08-27 DIAGNOSIS — R490 Dysphonia: Secondary | ICD-10-CM | POA: Diagnosis not present

## 2015-09-04 ENCOUNTER — Encounter: Payer: Self-pay | Admitting: Internal Medicine

## 2015-10-11 ENCOUNTER — Ambulatory Visit (INDEPENDENT_AMBULATORY_CARE_PROVIDER_SITE_OTHER): Payer: Commercial Managed Care - HMO | Admitting: Internal Medicine

## 2015-10-11 ENCOUNTER — Encounter: Payer: Self-pay | Admitting: Internal Medicine

## 2015-10-11 VITALS — BP 144/86 | HR 66 | Ht 69.0 in | Wt 190.0 lb

## 2015-10-11 DIAGNOSIS — R1310 Dysphagia, unspecified: Secondary | ICD-10-CM | POA: Diagnosis not present

## 2015-10-11 DIAGNOSIS — Z1211 Encounter for screening for malignant neoplasm of colon: Secondary | ICD-10-CM | POA: Diagnosis not present

## 2015-10-11 DIAGNOSIS — K219 Gastro-esophageal reflux disease without esophagitis: Secondary | ICD-10-CM

## 2015-10-11 DIAGNOSIS — R112 Nausea with vomiting, unspecified: Secondary | ICD-10-CM

## 2015-10-11 MED ORDER — NA SULFATE-K SULFATE-MG SULF 17.5-3.13-1.6 GM/177ML PO SOLN: 1.0000 | Freq: Once | ORAL | Status: DC

## 2015-10-11 MED ORDER — OMEPRAZOLE MAGNESIUM 20 MG PO TBEC: 20.0000 mg | DELAYED_RELEASE_TABLET | Freq: Every day | ORAL | Status: DC

## 2015-10-11 NOTE — Patient Instructions (Signed)
You have been scheduled for an endoscopy and colonoscopy. Please follow the written instructions given to you at your visit today. Please pick up your prep supplies at the pharmacy within the next 1-3 days. If you use inhalers (even only as needed), please bring them with you on the day of your procedure. Your physician has requested that you go to www.startemmi.com and enter the access code given to you at your visit today. This web site gives a general overview about your procedure. However, you should still follow specific instructions given to you by our office regarding your preparation for the procedure.   We have sent the following medications to your pharmacy for you to pick up at your convenience:  Omeprazole

## 2015-10-11 NOTE — Progress Notes (Signed)
HISTORY OF PRESENT ILLNESS:  Brittany Stark is a 70 y.o. female with a history of rheumatoid arthritis who is sent by her ENT physician Dr. Benjamine Mola regarding chronic reflux disease. Patient denies prior GI evaluations. She does have a long history of indigestion and heartburn. She was having problems with nocturnal vomiting and pharyngeal discomfort. 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 She was placed on omeprazole. On omeprazole classic reflux symptoms resolved. Nocturnal vomiting has been helped by avoiding meals after 4 PM. She has not had upper endoscopy. She does report occasional intermittent solid food dysphagia. GI review of systems is otherwise remarkable for occasional constipation. She has not had screening colonoscopy. No family history of colon cancer.  REVIEW OF SYSTEMS:  All non-GI ROS negative except for arthritis  Past Medical History  Diagnosis Date  . Depression   . GERD (gastroesophageal reflux disease)     laryngopharyngeal reflux  . Arthritis     rheumatoid and osteoarthritis   . Hemorrhoids     Past Surgical History  Procedure Laterality Date  . Abdominal hysterectomy    . Breast surgery      breast reduction   . Right ankle surgery       has pins   . Right arm surgery       pins in right arm   . Cystoscopy w/ retrogrades Right 05/01/2014    Procedure: CYSTOSCOPY WITH RIGHT RETROGRADE PYELOGRAM;  Surgeon: Malka So, MD;  Location: [facility];  Service: Urology;  Laterality: Right;  . Cystoscopy with biopsy  05/01/2014    Procedure: CYSTOSCOPY WITH bladder BIOPSY;  Surgeon: Malka So, MD;  Location: [facility];  Service: Urology;;    Social History [name]  reports that she has never smoked. She has never used smokeless tobacco. She reports that she does not drink alcohol or use illicit drugs.  family history includes Breast cancer in her maternal aunt; Heart disease in her sister.  Allergies  Allergen Reactions  . Amoxicillin Other (See  Comments)    Yeast infection   . Arava [Leflunomide] Other (See Comments)    hairloss   . Doxycycline Other (See Comments)    Eyes read and tearing   . Methotrexate Derivatives Other (See Comments)    The injectable causes skin lesions        PHYSICAL EXAMINATION: Vital signs: BP 144/86 mmHg  Pulse 66  Ht 5\' 9"  (1.753 m)  Wt 190 lb (86.183 kg)  BMI 28.05 kg/m2  Constitutional: Doesn't, generally well-appearing, no acute distress Psychiatric: alert and oriented x3, cooperative Eyes: extraocular movements intact, anicteric, conjunctiva pink Mouth: oral pharynx moist, no lesions Neck: supple without thyromegaly Lymph: no lymphadenopathy Cardiovascular: heart regular rate and rhythm, no murmur Lungs: clear to auscultation bilaterally Abdomen: soft, mildly obese, nontender, nondistended, no obvious ascites, no peritoneal signs, normal bowel sounds, no organomegaly Rectal: Deferred until colonoscopy Extremities: no clubbing cyanosis or lower extremity edema bilaterally Skin: no lesions on visible extremities Neuro: No focal deficits. DTRs intact. No asterixis.   ASSESSMENT:  #1. Chronic GERD. Classic symptoms improved with PPI #2. Nocturnal vomiting. Related to GERD. Improved with dietary alteration. Question hiatal hernia #3. Intermittent solid food dysphagia. Rule out peptic stricture #4. Colon cancer screening. Baseline risk. Interested #5. Gen. medical problems including arthritis.   PLAN:  #1. Reflux precautions. Reviewed #2. Omeprazole 20 mg daily. Prescribed for the patient. Proper way to administer reviewed #3. Schedule upper endoscopy with possible esophageal dilation.The nature of the procedure,  as well as the risks, benefits, and alternatives were carefully and thoroughly reviewed with the patient. Ample time for discussion and questions allowed. The patient understood, was satisfied, and agreed to proceed. #4. Schedule screening colonoscopy.The nature of the  procedure, as well as the risks, benefits, and alternatives were carefully and thoroughly reviewed with the patient. Ample time for discussion and questions allowed. The patient understood, was satisfied, and agreed to proceed.  A copy of this consultation note has been sent to Dr. Benjamine Mola

## 2015-10-23 ENCOUNTER — Telehealth: Payer: Self-pay

## 2015-10-23 NOTE — Telephone Encounter (Signed)
Reprinted instructions reflecting new colonoscopy time.  Mailed to patient

## 2015-10-25 DIAGNOSIS — I10 Essential (primary) hypertension: Secondary | ICD-10-CM | POA: Diagnosis not present

## 2015-10-25 DIAGNOSIS — E782 Mixed hyperlipidemia: Secondary | ICD-10-CM | POA: Diagnosis not present

## 2015-10-25 DIAGNOSIS — Z0000 Encounter for general adult medical examination without abnormal findings: Secondary | ICD-10-CM | POA: Diagnosis not present

## 2015-10-25 DIAGNOSIS — R600 Localized edema: Secondary | ICD-10-CM | POA: Diagnosis not present

## 2015-10-31 ENCOUNTER — Encounter: Payer: Self-pay | Admitting: Internal Medicine

## 2015-10-31 ENCOUNTER — Ambulatory Visit (AMBULATORY_SURGERY_CENTER): Payer: Commercial Managed Care - HMO | Admitting: Internal Medicine

## 2015-10-31 VITALS — BP 129/84 | HR 68 | Temp 98.9°F | Resp 20 | Ht 69.0 in | Wt 190.0 lb

## 2015-10-31 DIAGNOSIS — D120 Benign neoplasm of cecum: Secondary | ICD-10-CM

## 2015-10-31 DIAGNOSIS — E669 Obesity, unspecified: Secondary | ICD-10-CM | POA: Diagnosis not present

## 2015-10-31 DIAGNOSIS — Z1211 Encounter for screening for malignant neoplasm of colon: Secondary | ICD-10-CM

## 2015-10-31 DIAGNOSIS — R112 Nausea with vomiting, unspecified: Secondary | ICD-10-CM | POA: Diagnosis not present

## 2015-10-31 DIAGNOSIS — R1310 Dysphagia, unspecified: Secondary | ICD-10-CM

## 2015-10-31 DIAGNOSIS — K2100 Gastro-esophageal reflux disease with esophagitis, without bleeding: Secondary | ICD-10-CM

## 2015-10-31 DIAGNOSIS — K219 Gastro-esophageal reflux disease without esophagitis: Secondary | ICD-10-CM | POA: Diagnosis not present

## 2015-10-31 DIAGNOSIS — I10 Essential (primary) hypertension: Secondary | ICD-10-CM | POA: Diagnosis not present

## 2015-10-31 MED ORDER — OMEPRAZOLE MAGNESIUM 20 MG PO TBEC: 40.0000 mg | DELAYED_RELEASE_TABLET | Freq: Every day | ORAL | Status: DC

## 2015-10-31 MED ORDER — OMEPRAZOLE 40 MG PO CPDR: 40.0000 mg | DELAYED_RELEASE_CAPSULE | Freq: Every day | ORAL | Status: DC

## 2015-10-31 MED ORDER — SODIUM CHLORIDE 0.9 % IV SOLN: 500.0000 mL | INTRAVENOUS | Status: DC

## 2015-10-31 NOTE — Op Note (Signed)
[place]  [address]. [place], [id number]   COLONOSCOPY PROCEDURE REPORT  PATIENT: Brittany Stark, Brittany Stark  MR#: [id number] BIRTHDATE: 23-Dec-1945 , 69  yrs. old GENDER: female ENDOSCOPIST: Eustace Quail, MD REFERRED [id number] Ashby Dawes, M.D. PROCEDURE DATE:  10/31/2015 PROCEDURE:   Colonoscopy, screening and Colonoscopy with cold biopsy polypectomy x 1 First Screening Colonoscopy - Avg.  risk and is 50 yrs.  old or older Yes.  Prior Negative Screening - Now for repeat screening. N/A  History of Adenoma - Now for follow-up colonoscopy & has been > or = to 3 yrs.  N/A  Polyps removed today? Yes ASA CLASS:   Class II INDICATIONS:Screening for colonic neoplasia and Colorectal Neoplasm Risk Assessment for this procedure is average risk. MEDICATIONS: Monitored anesthesia care and Propofol 150 mg IV  DESCRIPTION OF PROCEDURE:   After the risks benefits and alternatives of the procedure were thoroughly explained, informed consent was obtained.  The digital rectal exam revealed no abnormalities of the rectum.   The LB [id number]  endoscope was introduced through the anus and advanced to the cecum, which was identified by both the appendix and ileocecal valve. No adverse events experienced.   The quality of the prep was excellent. (Suprep was used)  The instrument was then slowly withdrawn as the colon was fully examined. Estimated blood loss is zero unless otherwise noted in this procedure report.  COLON FINDINGS: A single polyp measuring 1 mm in size was found at the cecum.  A polypectomy was performed with jumbo cold forceps. The resection was complete, the polyp tissue was completely retrieved and sent to histology.   There was moderate diverticulosis noted in the left colon.   The examination was otherwise normal.  Retroflexed views revealed internal hemorrhoids. The time to cecum = 2.7 Withdrawal time = 8.9   The scope was withdrawn and the procedure  completed. COMPLICATIONS: There were no immediate complications.  ENDOSCOPIC IMPRESSION: 1.   Single polyp was found at the cecum; polypectomy was performed with jumbo cold forceps 2.   Moderate diverticulosis was noted in the left colon 3.   The examination was otherwise normal  RECOMMENDATIONS: 1.  Repeat colonoscopy in 5 years if polyp adenomatous; otherwise 10 years 2.  Upper endoscopy today (please see report)  eSigned:  Eustace Quail, MD 10/31/2015 1:31 PM   cc: The Patient and Merrilee Seashore, MD

## 2015-10-31 NOTE — Progress Notes (Signed)
Eggs alone cause upset stomach

## 2015-10-31 NOTE — Progress Notes (Signed)
Called to room to assist during endoscopic procedure.  Patient ID and intended procedure confirmed with present staff. Received instructions for my participation in the procedure from the performing physician.  

## 2015-10-31 NOTE — Progress Notes (Signed)
Report to PACU, RN, vss, BBS= Clear.  

## 2015-10-31 NOTE — Patient Instructions (Signed)

## 2015-10-31 NOTE — Op Note (Signed)
[place]  [address]. [place], [id number]   ENDOSCOPY PROCEDURE REPORT  PATIENT: Brittany, Stark  MR#: [id number] BIRTHDATE: June 14, 1946 , 69  yrs. old GENDER: female ENDOSCOPIST: Eustace Quail, MD REFERRED BY:  Merrilee Seashore, M.D. PROCEDURE DATE:  10/31/2015 PROCEDURE:  EGD, diagnostic ASA CLASS:     Class II INDICATIONS:  vomiting, dysphagia, and history of esophageal reflux.  MEDICATIONS: Monitored anesthesia care and Propofol 50 mg IV TOPICAL ANESTHETIC: none  DESCRIPTION OF PROCEDURE: After the risks benefits and alternatives of the procedure were thoroughly explained, informed consent was obtained.  The LB [id number] endoscope was introduced through the mouth and advanced to the second portion of the duodenum , Without limitations.  The instrument was slowly withdrawn as the mucosa was fully examined.  EXAM:Esophagus revealed mild esophagitis at the gastroesophageal junction manifested by edema and mild friability.  Stomach revealed a large (7 cm) hiatal hernia without complicating features. Several small benign fundic gland type polyps.  Otherwise normal stomach.  The duodenum was normal to the third portion. Retroflexed views revealed a hiatal hernia.     The scope was then withdrawn from the patient and the procedure completed.  COMPLICATIONS: There were no immediate complications.  ENDOSCOPIC IMPRESSION: 1. GERD with esophagitis and large hiatal hernia  RECOMMENDATIONS: 1.  Anti-reflux regimen to be followed, including avoiding meals near bedtime as you're doing 2.  Increase omeprazole to 40 mg daily; #30; 11 refills 3. Please make an office follow-up appointment with Dr. Henrene Pastor in about 3 months 4. Return to the care of your primary provider regarding your general medical needs  REPEAT EXAM:  eSigned:  Eustace Quail, MD 10/31/2015 1:35 PM    CC:The Patient and Merrilee Seashore, MD

## 2015-11-01 ENCOUNTER — Telehealth: Payer: Self-pay

## 2015-11-01 DIAGNOSIS — M059 Rheumatoid arthritis with rheumatoid factor, unspecified: Secondary | ICD-10-CM | POA: Diagnosis not present

## 2015-11-01 DIAGNOSIS — M1990 Unspecified osteoarthritis, unspecified site: Secondary | ICD-10-CM | POA: Diagnosis not present

## 2015-11-01 DIAGNOSIS — E782 Mixed hyperlipidemia: Secondary | ICD-10-CM | POA: Diagnosis not present

## 2015-11-01 DIAGNOSIS — I10 Essential (primary) hypertension: Secondary | ICD-10-CM | POA: Diagnosis not present

## 2015-11-01 NOTE — Telephone Encounter (Signed)
  Follow up Call-  Call back number 10/31/2015  Post procedure Call Back phone  # [phone]  Permission to leave phone message Yes     Patient was called for follow up after her procedure on 10/31/2015. No answer at the number given for follow up phone call. A message was left on the answering machine.

## 2015-11-04 ENCOUNTER — Telehealth: Payer: Self-pay | Admitting: Internal Medicine

## 2015-11-04 MED ORDER — OMEPRAZOLE 20 MG PO CPDR: 20.0000 mg | DELAYED_RELEASE_CAPSULE | Freq: Every day | ORAL | Status: DC

## 2015-11-04 NOTE — Telephone Encounter (Signed)
Resent Omeprazole 20mg  to pharmacy (originally sent in 40mg  but review of recent office note says 20mg )

## 2015-11-05 ENCOUNTER — Encounter: Payer: Self-pay | Admitting: Internal Medicine

## 2015-11-08 DIAGNOSIS — M06841 Other specified rheumatoid arthritis, right hand: Secondary | ICD-10-CM | POA: Diagnosis not present

## 2015-11-08 DIAGNOSIS — M1990 Unspecified osteoarthritis, unspecified site: Secondary | ICD-10-CM | POA: Diagnosis not present

## 2015-11-08 DIAGNOSIS — M06842 Other specified rheumatoid arthritis, left hand: Secondary | ICD-10-CM | POA: Diagnosis not present

## 2015-11-08 DIAGNOSIS — M059 Rheumatoid arthritis with rheumatoid factor, unspecified: Secondary | ICD-10-CM | POA: Diagnosis not present

## 2015-11-08 DIAGNOSIS — M79642 Pain in left hand: Secondary | ICD-10-CM | POA: Diagnosis not present

## 2015-11-08 DIAGNOSIS — Z79899 Other long term (current) drug therapy: Secondary | ICD-10-CM | POA: Diagnosis not present

## 2015-11-19 DIAGNOSIS — M059 Rheumatoid arthritis with rheumatoid factor, unspecified: Secondary | ICD-10-CM | POA: Diagnosis not present

## 2015-11-19 DIAGNOSIS — M79642 Pain in left hand: Secondary | ICD-10-CM | POA: Diagnosis not present

## 2015-11-19 DIAGNOSIS — Z79899 Other long term (current) drug therapy: Secondary | ICD-10-CM | POA: Diagnosis not present

## 2015-11-19 DIAGNOSIS — M1990 Unspecified osteoarthritis, unspecified site: Secondary | ICD-10-CM | POA: Diagnosis not present

## 2015-11-19 DIAGNOSIS — M170 Bilateral primary osteoarthritis of knee: Secondary | ICD-10-CM | POA: Diagnosis not present

## 2015-11-22 ENCOUNTER — Encounter: Payer: Self-pay | Admitting: Internal Medicine

## 2015-11-29 DIAGNOSIS — Z79899 Other long term (current) drug therapy: Secondary | ICD-10-CM | POA: Diagnosis not present

## 2015-11-29 DIAGNOSIS — M059 Rheumatoid arthritis with rheumatoid factor, unspecified: Secondary | ICD-10-CM | POA: Diagnosis not present

## 2015-11-29 DIAGNOSIS — M25569 Pain in unspecified knee: Secondary | ICD-10-CM | POA: Diagnosis not present

## 2015-11-29 DIAGNOSIS — M1990 Unspecified osteoarthritis, unspecified site: Secondary | ICD-10-CM | POA: Diagnosis not present

## 2015-12-24 DIAGNOSIS — M059 Rheumatoid arthritis with rheumatoid factor, unspecified: Secondary | ICD-10-CM | POA: Diagnosis not present

## 2015-12-24 DIAGNOSIS — Z79899 Other long term (current) drug therapy: Secondary | ICD-10-CM | POA: Diagnosis not present

## 2015-12-24 DIAGNOSIS — M1990 Unspecified osteoarthritis, unspecified site: Secondary | ICD-10-CM | POA: Diagnosis not present

## 2015-12-24 DIAGNOSIS — M25569 Pain in unspecified knee: Secondary | ICD-10-CM | POA: Diagnosis not present

## 2016-01-08 DIAGNOSIS — M170 Bilateral primary osteoarthritis of knee: Secondary | ICD-10-CM | POA: Diagnosis not present

## 2016-01-08 DIAGNOSIS — M25561 Pain in right knee: Secondary | ICD-10-CM | POA: Diagnosis not present

## 2016-03-25 DIAGNOSIS — M25569 Pain in unspecified knee: Secondary | ICD-10-CM | POA: Diagnosis not present

## 2016-03-25 DIAGNOSIS — M1990 Unspecified osteoarthritis, unspecified site: Secondary | ICD-10-CM | POA: Diagnosis not present

## 2016-03-25 DIAGNOSIS — M059 Rheumatoid arthritis with rheumatoid factor, unspecified: Secondary | ICD-10-CM | POA: Diagnosis not present

## 2016-03-25 DIAGNOSIS — Z79899 Other long term (current) drug therapy: Secondary | ICD-10-CM | POA: Diagnosis not present

## 2016-03-26 DIAGNOSIS — M170 Bilateral primary osteoarthritis of knee: Secondary | ICD-10-CM | POA: Diagnosis not present

## 2016-03-26 DIAGNOSIS — M25561 Pain in right knee: Secondary | ICD-10-CM | POA: Diagnosis not present

## 2016-03-30 DIAGNOSIS — N3941 Urge incontinence: Secondary | ICD-10-CM | POA: Diagnosis not present

## 2016-03-30 DIAGNOSIS — R351 Nocturia: Secondary | ICD-10-CM | POA: Diagnosis not present

## 2016-04-03 DIAGNOSIS — M170 Bilateral primary osteoarthritis of knee: Secondary | ICD-10-CM | POA: Diagnosis not present

## 2016-04-07 DIAGNOSIS — M170 Bilateral primary osteoarthritis of knee: Secondary | ICD-10-CM | POA: Diagnosis not present

## 2016-04-10 DIAGNOSIS — M170 Bilateral primary osteoarthritis of knee: Secondary | ICD-10-CM | POA: Diagnosis not present

## 2016-04-14 DIAGNOSIS — M170 Bilateral primary osteoarthritis of knee: Secondary | ICD-10-CM | POA: Diagnosis not present

## 2016-04-17 DIAGNOSIS — M170 Bilateral primary osteoarthritis of knee: Secondary | ICD-10-CM | POA: Diagnosis not present

## 2016-04-21 DIAGNOSIS — M170 Bilateral primary osteoarthritis of knee: Secondary | ICD-10-CM | POA: Diagnosis not present

## 2016-04-24 DIAGNOSIS — M170 Bilateral primary osteoarthritis of knee: Secondary | ICD-10-CM | POA: Diagnosis not present

## 2016-04-28 DIAGNOSIS — M170 Bilateral primary osteoarthritis of knee: Secondary | ICD-10-CM | POA: Diagnosis not present

## 2016-05-01 DIAGNOSIS — M170 Bilateral primary osteoarthritis of knee: Secondary | ICD-10-CM | POA: Diagnosis not present

## 2016-05-07 DIAGNOSIS — M1711 Unilateral primary osteoarthritis, right knee: Secondary | ICD-10-CM | POA: Diagnosis not present

## 2016-05-07 DIAGNOSIS — M1712 Unilateral primary osteoarthritis, left knee: Secondary | ICD-10-CM | POA: Diagnosis not present

## 2016-05-22 DIAGNOSIS — M1990 Unspecified osteoarthritis, unspecified site: Secondary | ICD-10-CM | POA: Diagnosis not present

## 2016-05-22 DIAGNOSIS — I10 Essential (primary) hypertension: Secondary | ICD-10-CM | POA: Diagnosis not present

## 2016-05-22 DIAGNOSIS — E782 Mixed hyperlipidemia: Secondary | ICD-10-CM | POA: Diagnosis not present

## 2016-05-25 DIAGNOSIS — B029 Zoster without complications: Secondary | ICD-10-CM | POA: Diagnosis not present

## 2016-05-27 DIAGNOSIS — M25561 Pain in right knee: Secondary | ICD-10-CM | POA: Diagnosis not present

## 2016-05-27 DIAGNOSIS — M170 Bilateral primary osteoarthritis of knee: Secondary | ICD-10-CM | POA: Diagnosis not present

## 2016-05-27 DIAGNOSIS — R262 Difficulty in walking, not elsewhere classified: Secondary | ICD-10-CM | POA: Diagnosis not present

## 2016-05-27 DIAGNOSIS — M25562 Pain in left knee: Secondary | ICD-10-CM | POA: Diagnosis not present

## 2016-05-29 DIAGNOSIS — B029 Zoster without complications: Secondary | ICD-10-CM | POA: Diagnosis not present

## 2016-06-01 DIAGNOSIS — I10 Essential (primary) hypertension: Secondary | ICD-10-CM | POA: Diagnosis not present

## 2016-06-01 DIAGNOSIS — E782 Mixed hyperlipidemia: Secondary | ICD-10-CM | POA: Diagnosis not present

## 2016-06-01 DIAGNOSIS — Z23 Encounter for immunization: Secondary | ICD-10-CM | POA: Diagnosis not present

## 2016-06-01 DIAGNOSIS — R600 Localized edema: Secondary | ICD-10-CM | POA: Diagnosis not present

## 2016-06-03 DIAGNOSIS — M25561 Pain in right knee: Secondary | ICD-10-CM | POA: Diagnosis not present

## 2016-06-03 DIAGNOSIS — M25562 Pain in left knee: Secondary | ICD-10-CM | POA: Diagnosis not present

## 2016-06-03 DIAGNOSIS — M170 Bilateral primary osteoarthritis of knee: Secondary | ICD-10-CM | POA: Diagnosis not present

## 2016-06-03 DIAGNOSIS — R262 Difficulty in walking, not elsewhere classified: Secondary | ICD-10-CM | POA: Diagnosis not present

## 2016-06-08 DIAGNOSIS — M25561 Pain in right knee: Secondary | ICD-10-CM | POA: Diagnosis not present

## 2016-06-08 DIAGNOSIS — M1711 Unilateral primary osteoarthritis, right knee: Secondary | ICD-10-CM | POA: Diagnosis not present

## 2016-06-09 DIAGNOSIS — M1712 Unilateral primary osteoarthritis, left knee: Secondary | ICD-10-CM | POA: Diagnosis not present

## 2016-06-09 DIAGNOSIS — M25562 Pain in left knee: Secondary | ICD-10-CM | POA: Diagnosis not present

## 2016-06-15 DIAGNOSIS — M1711 Unilateral primary osteoarthritis, right knee: Secondary | ICD-10-CM | POA: Diagnosis not present

## 2016-06-15 DIAGNOSIS — M25561 Pain in right knee: Secondary | ICD-10-CM | POA: Diagnosis not present

## 2016-06-16 DIAGNOSIS — M1712 Unilateral primary osteoarthritis, left knee: Secondary | ICD-10-CM | POA: Diagnosis not present

## 2016-06-16 DIAGNOSIS — M25562 Pain in left knee: Secondary | ICD-10-CM | POA: Diagnosis not present

## 2016-06-26 ENCOUNTER — Other Ambulatory Visit: Payer: Self-pay | Admitting: Internal Medicine

## 2016-06-26 DIAGNOSIS — Z1231 Encounter for screening mammogram for malignant neoplasm of breast: Secondary | ICD-10-CM

## 2016-07-09 DIAGNOSIS — N390 Urinary tract infection, site not specified: Secondary | ICD-10-CM | POA: Diagnosis not present

## 2016-07-23 DIAGNOSIS — M1712 Unilateral primary osteoarthritis, left knee: Secondary | ICD-10-CM | POA: Diagnosis not present

## 2016-07-23 DIAGNOSIS — M1711 Unilateral primary osteoarthritis, right knee: Secondary | ICD-10-CM | POA: Diagnosis not present

## 2016-07-23 DIAGNOSIS — M25562 Pain in left knee: Secondary | ICD-10-CM | POA: Diagnosis not present

## 2016-07-23 DIAGNOSIS — M25561 Pain in right knee: Secondary | ICD-10-CM | POA: Diagnosis not present

## 2016-07-24 ENCOUNTER — Ambulatory Visit
Admission: RE | Admit: 2016-07-24 | Discharge: 2016-07-24 | Disposition: A | Discharge status: Home / Self Care | Payer: Commercial Managed Care - HMO | Source: Ambulatory Visit | Attending: Internal Medicine | Admitting: Internal Medicine

## 2016-07-24 DIAGNOSIS — Z1231 Encounter for screening mammogram for malignant neoplasm of breast: Secondary | ICD-10-CM

## 2016-07-27 DIAGNOSIS — M059 Rheumatoid arthritis with rheumatoid factor, unspecified: Secondary | ICD-10-CM | POA: Diagnosis not present

## 2016-07-27 DIAGNOSIS — N390 Urinary tract infection, site not specified: Secondary | ICD-10-CM | POA: Diagnosis not present

## 2016-09-28 DIAGNOSIS — M1990 Unspecified osteoarthritis, unspecified site: Secondary | ICD-10-CM | POA: Diagnosis not present

## 2016-09-28 DIAGNOSIS — E782 Mixed hyperlipidemia: Secondary | ICD-10-CM | POA: Diagnosis not present

## 2016-09-28 DIAGNOSIS — I10 Essential (primary) hypertension: Secondary | ICD-10-CM | POA: Diagnosis not present

## 2016-10-05 DIAGNOSIS — I10 Essential (primary) hypertension: Secondary | ICD-10-CM | POA: Diagnosis not present

## 2016-10-05 DIAGNOSIS — E782 Mixed hyperlipidemia: Secondary | ICD-10-CM | POA: Diagnosis not present

## 2016-10-05 DIAGNOSIS — Z01818 Encounter for other preprocedural examination: Secondary | ICD-10-CM | POA: Diagnosis not present

## 2016-10-05 DIAGNOSIS — R600 Localized edema: Secondary | ICD-10-CM | POA: Diagnosis not present

## 2016-10-08 ENCOUNTER — Ambulatory Visit: Payer: Self-pay | Admitting: Orthopedic Surgery

## 2016-10-08 NOTE — H&P (Signed)
Brittany Stark DOB: 12/15/1945 Divorced / Language: English / Race: Black or African American Female Date of Admission:  11/02/2016 CC:  Right knee pain History of Present Illness  The patient is a 71 year old female who comes in for a preoperative History and Physical. The patient is scheduled for a right total knee arthroplasty to be performed by Dr. Dione Plover. Aluisio, MD at [facility] on 11-02-2016. The patient is a 71 year old female who is being followed for their bilateral (right > left) knee pain and osteoarthritis. Symptoms reported include: pain, giving way, instability and pain with weightbearing. The patient feels that they are doing poorly and report their pain level to moderate. Current treatment includes: activity modification and NSAIDs. The following medication has been used for pain control: antiinflammatory medication (Aleve, OTC cream). Note for "Follow-up Knee": The patient states she had Flexogenic injections which did not help. Unfortunately, her knees are getting worse. Right knee is far worse than the left. They hurts her all the time. She has now had cortisone injections and went to [facility] and had there viscosupplement injections, none of which have benefitted her. She is hurting all the time at a high level. It is limiting what she can and cannot do. She even has pain at night. She wants to be more active, but the knees preventing her from doing so. She is ready to get the knees fixed. She will start out with the right knee. They have been treated conservatively in the past for the above stated problem and despite conservative measures, they continue to have progressive pain and severe functional limitations and dysfunction. They have failed non-operative management including home exercise, medications, and injections. It is felt that they would benefit from undergoing total joint replacement. Risks and benefits of the procedure have been discussed with the patient and  they elect to proceed with surgery. There are no active contraindications to surgery such as ongoing infection or rapidly progressive neurological disease.  Problem List/Past Medical Chronic pain of both knees (M25.561, M25.562)  Chronic Pain  Depression  Gastroesophageal Reflux Disease  Osteoarthritis  Rheumatoid Arthritis  Primary osteoarthritis of both knees (M17.0)   Allergies Amoxicillin *PENICILLINS*  yeast infections Doxycycline Hyclate *TETRACYCLINES*  eyes red, tearing Arava *ANALGESICS - ANTI-INFLAMMATORY*  hairloss Methotrexate *ANTINEOPLASTICS AND ADJUNCTIVE THERAPIES*  skin lesions  Family History Depression  Sister. Drug / Alcohol Addiction  Brother. Hypertension  Sister. Osteoarthritis  Paternal Grandmother. Rheumatoid Arthritis  Sister. Father  Cancer, Smoking Mother  Ovarian Cancer  Social History Children  2 Current work status  retired Furniture conservator/restorer daily Living situation  live alone Marital status  divorced Never consumed alcohol  01/08/2016: Never consumed alcohol No history of drug/alcohol rehab  Number of flights of stairs before winded  1 Tobacco / smoke exposure  01/08/2016: no Tobacco use  Never smoker. 01/08/2016 Advance Directives  Healthcare POA  Medication History  BC Powders Active. PredniSONE (5MG  Tablet, Oral as needed) Active. HydroCHLOROthiazide (25MG  Tablet, Oral) Active. Omeprazole (20MG  Capsule DR, Oral) Active. VESIcare (5MG  Tablet, Oral) Active. Orencia (125MG /ML Soln Pref Syr, Subcutaneous) Active. Aspirin (81MG  Tablet, Oral) Active. Vitamin E (Oral) Specific strength unknown - Active. Multiple Vitamin (1 (one) Oral) Active. Calcium Citrate (1 (one) Oral) Specific strength unknown - Active.   Past Surgical History  Ankle Surgery  right Arthroscopy of Knee  right Breast Reconstruction  bilateral Cataract Surgery  bilateral Colon Polyp Removal - Colonoscopy  Hysterectomy   partial (non-cancerous)  Review  of Systems General Not Present- Chills, Fatigue, Fever, Memory Loss, Night Sweats, Weight Gain and Weight Loss. Skin Not Present- Eczema, Hives, Itching, Lesions and Rash. HEENT Not Present- Dentures, Double Vision, Headache, Hearing Loss, Tinnitus and Visual Loss. Respiratory Not Present- Allergies, Chronic Cough, Coughing up blood, Shortness of breath at rest and Shortness of breath with exertion. Cardiovascular Not Present- Chest Pain, Difficulty Breathing Lying Down, Murmur, Palpitations, Racing/skipping heartbeats and Swelling. Gastrointestinal Present- Constipation. Not Present- Abdominal Pain, Bloody Stool, Diarrhea, Difficulty Swallowing, Heartburn, Jaundice, Loss of appetitie, Nausea and Vomiting. Female Genitourinary Present- Urinating at Night. Not Present- Blood in Urine, Discharge, Flank Pain, Incontinence, Painful Urination, Urgency, Urinary frequency, Urinary Retention and Weak urinary stream. Musculoskeletal Present- Morning Stiffness. Not Present- Back Pain, Joint Pain, Joint Swelling, Muscle Pain, Muscle Weakness and Spasms. Neurological Not Present- Blackout spells, Difficulty with balance, Dizziness, Paralysis, Tremor and Weakness. Psychiatric Not Present- Insomnia.  Vitals  Weight: 180 lb Height: 68in Body Surface Area: 1.95 m Body Mass Index: 27.37 kg/m  Pulse: 64 (Regular)  BP: 118/82 (Sitting, Left Arm, Standard)   Physical Exam General Mental Status -Alert, cooperative and good historian. General Appearance-pleasant, Not in acute distress. Orientation-Oriented X3. Build & Nutrition-Well nourished and Well developed.  Head and Neck Head-normocephalic, atraumatic . Neck Global Assessment - supple, no bruit auscultated on the right, no bruit auscultated on the left.  Eye Vision-Wears corrective lenses(readers only). Pupil - Bilateral-Regular and Round. Motion - Bilateral-EOMI.  Chest and Lung  Exam Auscultation Breath sounds - clear at anterior chest wall and clear at posterior chest wall. Adventitious sounds - No Adventitious sounds.  Cardiovascular Auscultation Rhythm - Regular rate and rhythm. Heart Sounds - S1 WNL and S2 WNL. Murmurs & Other Heart Sounds - Auscultation of the heart reveals - No Murmurs.  Abdomen Palpation/Percussion Tenderness - Abdomen is non-tender to palpation. Rigidity (guarding) - Abdomen is soft. Auscultation Auscultation of the abdomen reveals - Bowel sounds normal.  Female Genitourinary Note: Not done, not pertinent to present illness   Musculoskeletal Note: Well-developed female, in no distress. Both knees show no effusion. There is marked crepitus on range of motion of each knee. On her right knee, she is very tender medially with no lateral tenderness or instability. Range of motion about 5 to 115 or 120. Left knee 0 to 125 with no instability. Pulse, sensation, motor intact. She has significant antalgic gait pattern.  RADIOGRAPHS AP and lateral of the knees show that she has got severe bone-on-bone arthritis, medial and patellofemoral compartments of both worse on the right than the left.  Assessment & Plan  Primary osteoarthritis of left knee (M17.12) Primary osteoarthritis of right knee (M17.11)  Note:Surgical Plans: Right Total Knee Replacement  Disposition: Home with family. Straight to outpatient therapy at [facility] to start on Friday 11/06/2016. Prescription for Physical Therapy given.  PCP: Dr. Ashby Dawes  IV TXA  Anesthesia Issues: None  Patient was instructed on what medications to stop prior to surgery.  Signed electronically by Ok Edwards, III PA-C

## 2016-10-15 ENCOUNTER — Encounter (HOSPITAL_COMMUNITY): Payer: Self-pay | Admitting: *Deleted

## 2016-10-16 ENCOUNTER — Ambulatory Visit: Payer: Self-pay | Admitting: Orthopedic Surgery

## 2016-10-22 ENCOUNTER — Other Ambulatory Visit (HOSPITAL_COMMUNITY): Payer: Self-pay | Admitting: Emergency Medicine

## 2016-10-22 ENCOUNTER — Encounter (HOSPITAL_COMMUNITY): Payer: Self-pay

## 2016-10-22 NOTE — Patient Instructions (Addendum)
Brittany Stark  10/22/2016   Your procedure is scheduled on: 11-02-16  Report to [facility] Main  Entrance take [facility]  elevators to 3rd floor to  [name] at 730AM.  Call this number if you have problems the morning of surgery [phone]   Remember: ONLY 1 PERSON MAY GO WITH YOU TO SHORT STAY TO GET  READY MORNING OF [name].  Do not eat food or drink liquids :After Midnight.     Take these medicines the morning of surgery with A SIP OF WATER: AMLODIPINE(NORVASC), OMEPRAZOLE(PRILOSEC), tylenol as needed                                You may not have any metal on your body including hair pins and              piercings  Do not wear jewelry, make-up, lotions, powders or perfumes, deodorant             Do not wear nail polish.  Do not shave  48 hours prior to surgery.              Men may shave face and neck.   Do not bring valuables to the hospital. [place].  Contacts, dentures or bridgework may not be worn into surgery.  Leave suitcase in the car. After surgery it may be brought to your room.              Please read over the following fact sheets you were given: _____________________________________________________________________             [facility] - Preparing for Surgery Before surgery, you can play an important role.  Because skin is not sterile, your skin needs to be as free of germs as possible.  You can reduce the number of germs on your skin by washing with CHG (chlorahexidine gluconate) soap before surgery.  CHG is an antiseptic cleaner which kills germs and bonds with the skin to continue killing germs even after washing. Please DO NOT use if you have an allergy to CHG or antibacterial soaps.  If your skin becomes reddened/irritated stop using the CHG and inform your nurse when you arrive at Short Stay. Do not shave (including legs and underarms) for at least 48 hours prior to the  first CHG shower.  You may shave your face/neck. Please follow these instructions carefully:  1.  Shower with CHG Soap the night before surgery and the  morning of Surgery.  2.  If you choose to wash your hair, wash your hair first as usual with your  normal  shampoo.  3.  After you shampoo, rinse your hair and body thoroughly to remove the  shampoo.                           4.  Use CHG as you would any other liquid soap.  You can apply chg directly  to the skin and wash                       Gently with a scrungie or clean washcloth.  5.  Apply the CHG Soap to  your body ONLY FROM THE NECK DOWN.   Do not use on face/ open                           Wound or open sores. Avoid contact with eyes, ears mouth and genitals (private parts).                       Wash face,  Genitals (private parts) with your normal soap.             6.  Wash thoroughly, paying special attention to the area where your surgery  will be performed.  7.  Thoroughly rinse your body with warm water from the neck down.  8.  DO NOT shower/wash with your normal soap after using and rinsing off  the CHG Soap.                9.  Pat yourself dry with a clean towel.            10.  Wear clean pajamas.            11.  Place clean sheets on your bed the night of your first shower and do not  sleep with pets. Day of Surgery : Do not apply any lotions/deodorants the morning of surgery.  Please wear clean clothes to the hospital/surgery center.  FAILURE TO FOLLOW THESE INSTRUCTIONS MAY RESULT IN THE CANCELLATION OF YOUR SURGERY PATIENT SIGNATURE_________________________________  NURSE SIGNATURE__________________________________  ________________________________________________________________________   [name]  An incentive spirometer is a tool that can help keep your lungs clear and active. This tool measures how well you are filling your lungs with each breath. Taking long deep breaths may help reverse or decrease  the chance of developing breathing (pulmonary) problems (especially infection) following:  A long period of time when you are unable to move or be active. BEFORE THE PROCEDURE   If the spirometer includes an indicator to show your best effort, your nurse or respiratory therapist will set it to a desired goal.  If possible, sit up straight or lean slightly forward. Try not to slouch.  Hold the incentive spirometer in an upright position. INSTRUCTIONS FOR USE  1. Sit on the edge of your bed if possible, or sit up as far as you can in bed or on a chair. 2. Hold the incentive spirometer in an upright position. 3. Breathe out normally. 4. Place the mouthpiece in your mouth and seal your lips tightly around it. 5. Breathe in slowly and as deeply as possible, raising the piston or the ball toward the top of the column. 6. Hold your breath for 3-5 seconds or for as long as possible. Allow the piston or ball to fall to the bottom of the column. 7. Remove the mouthpiece from your mouth and breathe out normally. 8. Rest for a few seconds and repeat Steps 1 through 7 at least 10 times every 1-2 hours when you are awake. Take your time and take a few normal breaths between deep breaths. 9. The spirometer may include an indicator to show your best effort. Use the indicator as a goal to work toward during each repetition. 10. After each set of 10 deep breaths, practice coughing to be sure your lungs are clear. If you have an incision (the cut made at the time of surgery), support your incision when coughing by placing a pillow or rolled up towels firmly against  it. Once you are able to get out of bed, walk around indoors and cough well. You may stop using the incentive spirometer when instructed by your caregiver.  RISKS AND COMPLICATIONS  Take your time so you do not get dizzy or light-headed.  If you are in pain, you may need to take or ask for pain medication before doing incentive spirometry. It is  harder to take a deep breath if you are having pain. AFTER USE  Rest and breathe slowly and easily.  It can be helpful to keep track of a log of your progress. Your caregiver can provide you with a simple table to help with this. If you are using the spirometer at home, follow these instructions: [place] IF:   You are having difficultly using the spirometer.  You have trouble using the spirometer as often as instructed.  Your pain medication is not giving enough relief while using the spirometer.  You develop fever of 100.5 F (38.1 C) or higher. SEEK IMMEDIATE MEDICAL CARE IF:   You cough up bloody sputum that had not been present before.  You develop fever of 102 F (38.9 C) or greater.  You develop worsening pain at or near the incision site. MAKE SURE YOU:   Understand these instructions.  Will watch your condition.  Will get help right away if you are not doing well or get worse. Document Released: 01/11/2007 Document Revised: 11/23/2011 Document Reviewed: 03/14/2007 ExitCare Patient Information 2014 ExitCare, [place].   ________________________________________________________________________  WHAT IS A BLOOD TRANSFUSION? Blood Transfusion Information  A transfusion is the replacement of blood or some of its parts. Blood is made up of multiple cells which provide different functions.  Red blood cells carry oxygen and are used for blood loss replacement.  White blood cells fight against infection.  Platelets control bleeding.  Plasma helps clot blood.  Other blood products are available for specialized needs, such as hemophilia or other clotting disorders. BEFORE THE TRANSFUSION  Who gives blood for transfusions?   Healthy volunteers who are fully evaluated to make sure their blood is safe. This is blood bank blood. Transfusion therapy is the safest it has ever been in the practice of medicine. Before blood is taken from a donor, a complete history  is taken to make sure that person has no history of diseases nor engages in risky social behavior (examples are intravenous drug use or sexual activity with multiple partners). The donor's travel history is screened to minimize risk of transmitting infections, such as malaria. The donated blood is tested for signs of infectious diseases, such as HIV and hepatitis. The blood is then tested to be sure it is compatible with you in order to minimize the chance of a transfusion reaction. If you or a relative donates blood, this is often done in anticipation of surgery and is not appropriate for emergency situations. It takes many days to process the donated blood. RISKS AND COMPLICATIONS Although transfusion therapy is very safe and saves many lives, the main dangers of transfusion include:   Getting an infectious disease.  Developing a transfusion reaction. This is an allergic reaction to something in the blood you were given. Every precaution is taken to prevent this. The decision to have a blood transfusion has been considered carefully by your caregiver before blood is given. Blood is not given unless the benefits outweigh the risks. AFTER THE TRANSFUSION  Right after receiving a blood transfusion, you will usually feel much better and more  energetic. This is especially true if your red blood cells have gotten low (anemic). The transfusion raises the level of the red blood cells which carry oxygen, and this usually causes an energy increase.  The nurse administering the transfusion will monitor you carefully for complications. HOME CARE INSTRUCTIONS  No special instructions are needed after a transfusion. You may find your energy is better. Speak with your caregiver about any limitations on activity for underlying diseases you may have. SEEK MEDICAL CARE IF:   Your condition is not improving after your transfusion.  You develop redness or irritation at the intravenous (IV) site. SEEK IMMEDIATE  MEDICAL CARE IF:  Any of the following symptoms occur over the next 12 hours:  Shaking chills.  You have a temperature by mouth above 102 F (38.9 C), not controlled by medicine.  Chest, back, or muscle pain.  People around you feel you are not acting correctly or are confused.  Shortness of breath or difficulty breathing.  Dizziness and fainting.  You get a rash or develop hives.  You have a decrease in urine output.  Your urine turns a dark color or changes to pink, red, or brown. Any of the following symptoms occur over the next 10 days:  You have a temperature by mouth above 102 F (38.9 C), not controlled by medicine.  Shortness of breath.  Weakness after normal activity.  The white part of the eye turns yellow (jaundice).  You have a decrease in the amount of urine or are urinating less often.  Your urine turns a dark color or changes to pink, red, or brown. Document Released: 08/28/2000 Document Revised: 11/23/2011 Document Reviewed: 04/16/2008 [facility] Patient Information 2014 [place], [place].  _______________________________________________________________________

## 2016-10-22 NOTE — Progress Notes (Signed)
LOV/MEDICAL CLEARANCE DR Ashby Dawes 10-05-16 ON CHART EKG 09-28-16 ON CHART

## 2016-10-26 ENCOUNTER — Encounter (INDEPENDENT_AMBULATORY_CARE_PROVIDER_SITE_OTHER): Payer: Self-pay

## 2016-10-26 ENCOUNTER — Encounter (HOSPITAL_COMMUNITY)
Admission: RE | Admit: 2016-10-26 | Discharge: 2016-10-26 | Disposition: A | Discharge status: Home / Self Care | Payer: Medicare HMO | Source: Ambulatory Visit | Attending: Orthopedic Surgery | Admitting: Orthopedic Surgery

## 2016-10-26 ENCOUNTER — Encounter (HOSPITAL_COMMUNITY): Payer: Self-pay

## 2016-10-26 DIAGNOSIS — M1711 Unilateral primary osteoarthritis, right knee: Secondary | ICD-10-CM | POA: Diagnosis not present

## 2016-10-26 DIAGNOSIS — Z01812 Encounter for preprocedural laboratory examination: Secondary | ICD-10-CM | POA: Insufficient documentation

## 2016-10-26 HISTORY — DX: Essential (primary) hypertension: I10

## 2016-10-26 HISTORY — DX: Pure hypercholesterolemia, unspecified: E78.00

## 2016-10-26 LAB — SURGICAL PCR SCREEN
MRSA, PCR: NEGATIVE
STAPHYLOCOCCUS AUREUS: NEGATIVE

## 2016-10-26 LAB — CBC
HEMATOCRIT: 34.7 % — AB (ref 36.0–46.0)
HEMOGLOBIN: 11.2 g/dL — AB (ref 12.0–15.0)
MCH: 29.5 pg (ref 26.0–34.0)
MCHC: 32.3 g/dL (ref 30.0–36.0)
MCV: 91.3 fL (ref 78.0–100.0)
Platelets: 212 10*3/uL (ref 150–400)
RBC: 3.8 MIL/uL — AB (ref 3.87–5.11)
RDW: 14.9 % (ref 11.5–15.5)
WBC: 4.7 10*3/uL (ref 4.0–10.5)

## 2016-10-26 LAB — COMPREHENSIVE METABOLIC PANEL
ALBUMIN: 3.2 g/dL — AB (ref 3.5–5.0)
ALT: 13 U/L — ABNORMAL LOW (ref 14–54)
AST: 20 U/L (ref 15–41)
Alkaline Phosphatase: 84 U/L (ref 38–126)
Anion gap: 7 (ref 5–15)
BILIRUBIN TOTAL: 0.3 mg/dL (ref 0.3–1.2)
BUN: 11 mg/dL (ref 6–20)
CO2: 26 mmol/L (ref 22–32)
Calcium: 8.9 mg/dL (ref 8.9–10.3)
Chloride: 109 mmol/L (ref 101–111)
Creatinine, Ser: 0.78 mg/dL (ref 0.44–1.00)
GFR calc Af Amer: >60 mL/min (ref >60)
GFR calc non Af Amer: >60 mL/min (ref >60)
GLUCOSE: 84 mg/dL (ref 65–99)
POTASSIUM: 3 mmol/L — AB (ref 3.5–5.1)
Sodium: 142 mmol/L (ref 135–145)
TOTAL PROTEIN: 8 g/dL (ref 6.5–8.1)

## 2016-10-26 LAB — APTT: APTT: 29 s (ref 24–36)

## 2016-10-26 LAB — PROTIME-INR
INR: 1.06
Prothrombin Time: 13.8 seconds (ref 11.4–15.2)

## 2016-10-26 LAB — ABO/RH: ABO/RH(D): A POS

## 2016-11-01 ENCOUNTER — Ambulatory Visit: Payer: Self-pay | Admitting: Orthopedic Surgery

## 2016-11-01 NOTE — H&P (Signed)
Brittany Stark DOB: [birth year] Divorced / Language: English / Race: Black or African American Female Date of Admission:  11/02/2016 CC:  Right knee pain History of Present Illness  The patient is a 71 year old female who comes in for a preoperative History and Physical. The patient is scheduled for a right total knee arthroplasty to be performed by Dr. Dione Plover. Aluisio, MD at [facility] on 11-02-2016. The patient is a 71 year old female who is being followed for their bilateral (right > left) knee pain and osteoarthritis. Symptoms reported include: pain, giving way, instability and pain with weightbearing. The patient feels that they are doing poorly and report their pain level to moderate. Current treatment includes: activity modification and NSAIDs. The following medication has been used for pain control: antiinflammatory medication (Aleve, OTC cream). Note for "Follow-up Knee": The patient states she had Flexogenic injections which did not help. Unfortunately, her knees are getting worse. Right knee is far worse than the left. They hurts her all the time. She has now had cortisone injections and went to [facility] and had there viscosupplement injections, none of which have benefitted her. She is hurting all the time at a high level. It is limiting what she can and cannot do. She even has pain at night. She wants to be more active, but the knees preventing her from doing so. She is ready to get the knees fixed. She will start out with the right knee. They have been treated conservatively in the past for the above stated problem and despite conservative measures, they continue to have progressive pain and severe functional limitations and dysfunction. They have failed non-operative management including home exercise, medications, and injections. It is felt that they would benefit from undergoing total joint replacement. Risks and benefits of the procedure have been discussed with the patient  and they elect to proceed with surgery. There are no active contraindications to surgery such as ongoing infection or rapidly progressive neurological disease.  Problem List/Past Medical Chronic pain of both knees (M25.561, M25.562)  Chronic Pain  Depression  Gastroesophageal Reflux Disease  Osteoarthritis  Rheumatoid Arthritis  Primary osteoarthritis of both knees (M17.0)   Allergies Amoxicillin *PENICILLINS*  yeast infections Doxycycline Hyclate *TETRACYCLINES*  eyes red, tearing Arava *ANALGESICS - ANTI-INFLAMMATORY*  hairloss Methotrexate *ANTINEOPLASTICS AND ADJUNCTIVE THERAPIES*  skin lesions  Family History Depression  Sister. Drug / Alcohol Addiction  Brother. Hypertension  Sister. Osteoarthritis  Paternal Grandmother. Rheumatoid Arthritis  Sister. Father  Cancer, Smoking Mother  Ovarian Cancer  Social History Children  2 Current work status  retired Furniture conservator/restorer daily Living situation  live alone Marital status  divorced Never consumed alcohol  01/08/2016: Never consumed alcohol No history of drug/alcohol rehab  Number of flights of stairs before winded  1 Tobacco / smoke exposure  01/08/2016: no Tobacco use  Never smoker. 01/08/2016 Advance Directives  Healthcare POA  Medication History  BC Powders Active. PredniSONE (5MG  Tablet, Oral as needed) Active. HydroCHLOROthiazide (25MG  Tablet, Oral) Active. Omeprazole (20MG  Capsule DR, Oral) Active. VESIcare (5MG  Tablet, Oral) Active. Orencia (125MG /ML Soln Pref Syr, Subcutaneous) Active. Aspirin (81MG  Tablet, Oral) Active. Vitamin E (Oral) Specific strength unknown - Active. Multiple Vitamin (1 (one) Oral) Active. Calcium Citrate (1 (one) Oral) Specific strength unknown - Active.   Past Surgical History  Ankle Surgery  right Arthroscopy of Knee  right Breast Reconstruction  bilateral Cataract Surgery  bilateral Colon Polyp Removal - Colonoscopy   Hysterectomy  partial (non-cancerous)  Review  of Systems General Not Present- Chills, Fatigue, Fever, Memory Loss, Night Sweats, Weight Gain and Weight Loss. Skin Not Present- Eczema, Hives, Itching, Lesions and Rash. HEENT Not Present- Dentures, Double Vision, Headache, Hearing Loss, Tinnitus and Visual Loss. Respiratory Not Present- Allergies, Chronic Cough, Coughing up blood, Shortness of breath at rest and Shortness of breath with exertion. Cardiovascular Not Present- Chest Pain, Difficulty Breathing Lying Down, Murmur, Palpitations, Racing/skipping heartbeats and Swelling. Gastrointestinal Present- Constipation. Not Present- Abdominal Pain, Bloody Stool, Diarrhea, Difficulty Swallowing, Heartburn, Jaundice, Loss of appetitie, Nausea and Vomiting. Female Genitourinary Present- Urinating at Night. Not Present- Blood in Urine, Discharge, Flank Pain, Incontinence, Painful Urination, Urgency, Urinary frequency, Urinary Retention and Weak urinary stream. Musculoskeletal Present- Morning Stiffness. Not Present- Back Pain, Joint Pain, Joint Swelling, Muscle Pain, Muscle Weakness and Spasms. Neurological Not Present- Blackout spells, Difficulty with balance, Dizziness, Paralysis, Tremor and Weakness. Psychiatric Not Present- Insomnia.  Vitals  Weight: 180 lb Height: 68in Body Surface Area: 1.95 m Body Mass Index: 27.37 kg/m  Pulse: 64 (Regular)  BP: 118/82 (Sitting, Left Arm, Standard)   Physical Exam General Mental Status -Alert, cooperative and good historian. General Appearance-pleasant, Not in acute distress. Orientation-Oriented X3. Build & Nutrition-Well nourished and Well developed.  Head and Neck Head-normocephalic, atraumatic . Neck Global Assessment - supple, no bruit auscultated on the right, no bruit auscultated on the left.  Eye Vision-Wears corrective lenses(readers only). Pupil - Bilateral-Regular and Round. Motion -  Bilateral-EOMI.  Chest and Lung Exam Auscultation Breath sounds - clear at anterior chest wall and clear at posterior chest wall. Adventitious sounds - No Adventitious sounds.  Cardiovascular Auscultation Rhythm - Regular rate and rhythm. Heart Sounds - S1 WNL and S2 WNL. Murmurs & Other Heart Sounds - Auscultation of the heart reveals - No Murmurs.  Abdomen Palpation/Percussion Tenderness - Abdomen is non-tender to palpation. Rigidity (guarding) - Abdomen is soft. Auscultation Auscultation of the abdomen reveals - Bowel sounds normal.  Female Genitourinary Note: Not done, not pertinent to present illness   Musculoskeletal Note: Well-developed female, in no distress. Both knees show no effusion. There is marked crepitus on range of motion of each knee. On her right knee, she is very tender medially with no lateral tenderness or instability. Range of motion about 5 to 115 or 120. Left knee 0 to 125 with no instability. Pulse, sensation, motor intact. She has significant antalgic gait pattern.  RADIOGRAPHS AP and lateral of the knees show that she has got severe bone-on-bone arthritis, medial and patellofemoral compartments of both worse on the right than the left.  Assessment & Plan  Primary osteoarthritis of left knee (M17.12) Primary osteoarthritis of right knee (M17.11)  Note:Surgical Plans: Right Total Knee Replacement  Disposition: Home with family. Straight to outpatient therapy at [facility] to start on Friday 11/06/2016. Prescription for Physical Therapy given.  PCP: Dr. Ashby Dawes  IV TXA  Anesthesia Issues: None  Patient was instructed on what medications to stop prior to surgery.  Signed electronically by Ok Edwards, III PA-C

## 2016-11-02 ENCOUNTER — Inpatient Hospital Stay (HOSPITAL_COMMUNITY): Payer: Medicare HMO | Admitting: Certified Registered"

## 2016-11-02 ENCOUNTER — Inpatient Hospital Stay (HOSPITAL_COMMUNITY)
Admission: RE | Admit: 2016-11-02 | Discharge: 2016-11-04 | DRG: 470 | Disposition: A | Discharge status: Home / Self Care | Payer: Medicare HMO | Source: Ambulatory Visit | Attending: Orthopedic Surgery | Admitting: Orthopedic Surgery

## 2016-11-02 ENCOUNTER — Encounter (HOSPITAL_COMMUNITY)
Admission: RE | Disposition: A | Discharge status: Home / Self Care | Payer: Self-pay | Source: Ambulatory Visit | Attending: Orthopedic Surgery

## 2016-11-02 ENCOUNTER — Encounter (HOSPITAL_COMMUNITY): Payer: Self-pay | Admitting: *Deleted

## 2016-11-02 DIAGNOSIS — I10 Essential (primary) hypertension: Secondary | ICD-10-CM | POA: Diagnosis present

## 2016-11-02 DIAGNOSIS — N3090 Cystitis, unspecified without hematuria: Secondary | ICD-10-CM | POA: Diagnosis not present

## 2016-11-02 DIAGNOSIS — F329 Major depressive disorder, single episode, unspecified: Secondary | ICD-10-CM | POA: Diagnosis not present

## 2016-11-02 DIAGNOSIS — M069 Rheumatoid arthritis, unspecified: Secondary | ICD-10-CM | POA: Diagnosis not present

## 2016-11-02 DIAGNOSIS — G8929 Other chronic pain: Secondary | ICD-10-CM | POA: Diagnosis not present

## 2016-11-02 DIAGNOSIS — Z7982 Long term (current) use of aspirin: Secondary | ICD-10-CM | POA: Diagnosis not present

## 2016-11-02 DIAGNOSIS — Z79899 Other long term (current) drug therapy: Secondary | ICD-10-CM | POA: Diagnosis not present

## 2016-11-02 DIAGNOSIS — M170 Bilateral primary osteoarthritis of knee: Secondary | ICD-10-CM | POA: Diagnosis not present

## 2016-11-02 DIAGNOSIS — G8918 Other acute postprocedural pain: Secondary | ICD-10-CM | POA: Diagnosis not present

## 2016-11-02 DIAGNOSIS — M1711 Unilateral primary osteoarthritis, right knee: Secondary | ICD-10-CM | POA: Diagnosis not present

## 2016-11-02 DIAGNOSIS — Z881 Allergy status to other antibiotic agents status: Secondary | ICD-10-CM

## 2016-11-02 DIAGNOSIS — E7800 Pure hypercholesterolemia, unspecified: Secondary | ICD-10-CM | POA: Diagnosis present

## 2016-11-02 DIAGNOSIS — Z888 Allergy status to other drugs, medicaments and biological substances status: Secondary | ICD-10-CM | POA: Diagnosis not present

## 2016-11-02 DIAGNOSIS — M1710 Unilateral primary osteoarthritis, unspecified knee: Secondary | ICD-10-CM

## 2016-11-02 DIAGNOSIS — K219 Gastro-esophageal reflux disease without esophagitis: Secondary | ICD-10-CM | POA: Diagnosis not present

## 2016-11-02 DIAGNOSIS — M179 Osteoarthritis of knee, unspecified: Secondary | ICD-10-CM

## 2016-11-02 DIAGNOSIS — Z880 Allergy status to penicillin: Secondary | ICD-10-CM | POA: Diagnosis not present

## 2016-11-02 DIAGNOSIS — Z886 Allergy status to analgesic agent status: Secondary | ICD-10-CM

## 2016-11-02 DIAGNOSIS — M1712 Unilateral primary osteoarthritis, left knee: Secondary | ICD-10-CM | POA: Diagnosis present

## 2016-11-02 DIAGNOSIS — N1330 Unspecified hydronephrosis: Secondary | ICD-10-CM | POA: Diagnosis not present

## 2016-11-02 DIAGNOSIS — M25561 Pain in right knee: Secondary | ICD-10-CM | POA: Diagnosis present

## 2016-11-02 HISTORY — PX: TOTAL KNEE ARTHROPLASTY: SHX125

## 2016-11-02 LAB — TYPE AND SCREEN
ABO/RH(D): A POS
ANTIBODY SCREEN: NEGATIVE

## 2016-11-02 SURGERY — ARTHROPLASTY, KNEE, TOTAL
Anesthesia: Spinal | Site: Knee | Laterality: Right

## 2016-11-02 MED ORDER — ACETAMINOPHEN 10 MG/ML IV SOLN
INTRAVENOUS | Status: AC
  Filled 2016-11-02: qty 100

## 2016-11-02 MED ORDER — FLEET ENEMA 7-19 GM/118ML RE ENEM: 1.0000 | ENEMA | Freq: Once | RECTAL | Status: DC | PRN

## 2016-11-02 MED ORDER — MIDAZOLAM HCL 2 MG/2ML IJ SOLN
1.0000 mg | Freq: Once | INTRAMUSCULAR | Status: AC
  Administered 2016-11-02: 1 mg via INTRAVENOUS

## 2016-11-02 MED ORDER — AMLODIPINE BESYLATE 5 MG PO TABS
2.5000 mg | ORAL_TABLET | Freq: Every day | ORAL | Status: DC
  Administered 2016-11-03 – 2016-11-04 (×2): 2.5 mg via ORAL
  Filled 2016-11-02 (×2): qty 1

## 2016-11-02 MED ORDER — BUPIVACAINE LIPOSOME 1.3 % IJ SUSP
INTRAMUSCULAR | Status: DC | PRN
  Administered 2016-11-02: 20 mL

## 2016-11-02 MED ORDER — MEPERIDINE HCL 50 MG/ML IJ SOLN: 6.2500 mg | INTRAMUSCULAR | Status: DC | PRN

## 2016-11-02 MED ORDER — OMEPRAZOLE MAGNESIUM 20 MG PO TBEC: 20.0000 mg | DELAYED_RELEASE_TABLET | Freq: Every day | ORAL | Status: DC

## 2016-11-02 MED ORDER — BISACODYL 10 MG RE SUPP: 10.0000 mg | Freq: Every day | RECTAL | Status: DC | PRN

## 2016-11-02 MED ORDER — METHOCARBAMOL 500 MG PO TABS
500.0000 mg | ORAL_TABLET | Freq: Four times a day (QID) | ORAL | Status: DC | PRN
  Administered 2016-11-03 – 2016-11-04 (×3): 500 mg via ORAL
  Filled 2016-11-02 (×4): qty 1

## 2016-11-02 MED ORDER — METOCLOPRAMIDE HCL 5 MG PO TABS: 5.0000 mg | ORAL_TABLET | Freq: Three times a day (TID) | ORAL | Status: DC | PRN

## 2016-11-02 MED ORDER — DEXAMETHASONE SODIUM PHOSPHATE 10 MG/ML IJ SOLN
10.0000 mg | Freq: Once | INTRAMUSCULAR | Status: AC
  Administered 2016-11-03: 10 mg via INTRAVENOUS
  Filled 2016-11-02: qty 1

## 2016-11-02 MED ORDER — LACTATED RINGERS IV SOLN
INTRAVENOUS | Status: DC
  Administered 2016-11-02 (×2): via INTRAVENOUS

## 2016-11-02 MED ORDER — FENTANYL CITRATE (PF) 100 MCG/2ML IJ SOLN
50.0000 ug | Freq: Once | INTRAMUSCULAR | Status: AC
Start: 2016-11-02 — End: 2016-11-02
  Administered 2016-11-02: 50 ug via INTRAVENOUS

## 2016-11-02 MED ORDER — HYDROMORPHONE HCL 1 MG/ML IJ SOLN: 0.2500 mg | INTRAMUSCULAR | Status: DC | PRN

## 2016-11-02 MED ORDER — PROPOFOL 10 MG/ML IV BOLUS
INTRAVENOUS | Status: AC
  Filled 2016-11-02: qty 80

## 2016-11-02 MED ORDER — MIDAZOLAM HCL 2 MG/2ML IJ SOLN
INTRAMUSCULAR | Status: AC
  Filled 2016-11-02: qty 2

## 2016-11-02 MED ORDER — MENTHOL 3 MG MT LOZG: 1.0000 | LOZENGE | OROMUCOSAL | Status: DC | PRN

## 2016-11-02 MED ORDER — SODIUM CHLORIDE 0.9 % IV SOLN
INTRAVENOUS | Status: DC
  Administered 2016-11-02: 15:00:00 via INTRAVENOUS

## 2016-11-02 MED ORDER — CHLORHEXIDINE GLUCONATE 4 % EX LIQD: 60.0000 mL | Freq: Once | CUTANEOUS | Status: DC

## 2016-11-02 MED ORDER — PROMETHAZINE HCL 25 MG/ML IJ SOLN: 6.2500 mg | INTRAMUSCULAR | Status: DC | PRN

## 2016-11-02 MED ORDER — RIVAROXABAN 10 MG PO TABS
10.0000 mg | ORAL_TABLET | Freq: Every day | ORAL | Status: DC
  Administered 2016-11-03 – 2016-11-04 (×2): 10 mg via ORAL
  Filled 2016-11-02 (×2): qty 1

## 2016-11-02 MED ORDER — TRANEXAMIC ACID 1000 MG/10ML IV SOLN
1000.0000 mg | Freq: Once | INTRAVENOUS | Status: AC
  Administered 2016-11-02: 15:00:00 1000 mg via INTRAVENOUS
  Filled 2016-11-02: qty 1100

## 2016-11-02 MED ORDER — BUPIVACAINE IN DEXTROSE 0.75-8.25 % IT SOLN
INTRATHECAL | Status: DC | PRN
Start: 2016-11-02 — End: 2016-11-02
  Administered 2016-11-02: 1.6 mL via INTRATHECAL

## 2016-11-02 MED ORDER — OXYCODONE HCL 5 MG PO TABS
5.0000 mg | ORAL_TABLET | ORAL | Status: DC | PRN
  Administered 2016-11-02 (×3): 5 mg via ORAL
  Administered 2016-11-02: 22:00:00 10 mg via ORAL
  Administered 2016-11-02: 14:00:00 5 mg via ORAL
  Administered 2016-11-03: 10 mg via ORAL
  Administered 2016-11-03: 5 mg via ORAL
  Administered 2016-11-03 (×2): 10 mg via ORAL
  Administered 2016-11-03 (×3): 5 mg via ORAL
  Administered 2016-11-04 (×2): 10 mg via ORAL
  Filled 2016-11-02 (×2): qty 1
  Filled 2016-11-02: qty 2
  Filled 2016-11-02 (×2): qty 1
  Filled 2016-11-02 (×2): qty 2
  Filled 2016-11-02: qty 1
  Filled 2016-11-02: qty 2
  Filled 2016-11-02: qty 1
  Filled 2016-11-02: qty 2
  Filled 2016-11-02 (×2): qty 1
  Filled 2016-11-02: qty 2

## 2016-11-02 MED ORDER — VANCOMYCIN HCL IN DEXTROSE 1-5 GM/200ML-% IV SOLN
1000.0000 mg | INTRAVENOUS | Status: AC
  Administered 2016-11-02: 1000 mg via INTRAVENOUS
  Filled 2016-11-02: qty 200

## 2016-11-02 MED ORDER — CEFAZOLIN SODIUM-DEXTROSE 2-4 GM/100ML-% IV SOLN
2.0000 g | Freq: Four times a day (QID) | INTRAVENOUS | Status: AC
  Administered 2016-11-02 (×2): 2 g via INTRAVENOUS
  Filled 2016-11-02 (×2): qty 100

## 2016-11-02 MED ORDER — BUPIVACAINE LIPOSOME 1.3 % IJ SUSP
20.0000 mL | Freq: Once | INTRAMUSCULAR | Status: DC
  Filled 2016-11-02: qty 20

## 2016-11-02 MED ORDER — DARIFENACIN HYDROBROMIDE ER 15 MG PO TB24
15.0000 mg | ORAL_TABLET | Freq: Every day | ORAL | Status: DC
  Administered 2016-11-03 – 2016-11-04 (×2): 15 mg via ORAL
  Filled 2016-11-02 (×2): qty 1

## 2016-11-02 MED ORDER — DEXAMETHASONE SODIUM PHOSPHATE 10 MG/ML IJ SOLN
INTRAMUSCULAR | Status: AC
  Filled 2016-11-02: qty 1

## 2016-11-02 MED ORDER — ROPIVACAINE HCL 7.5 MG/ML IJ SOLN
INTRAMUSCULAR | Status: DC | PRN
  Administered 2016-11-02: 20 mL via PERINEURAL

## 2016-11-02 MED ORDER — METHOCARBAMOL 1000 MG/10ML IJ SOLN
500.0000 mg | Freq: Four times a day (QID) | INTRAVENOUS | Status: DC | PRN
  Administered 2016-11-02: 500 mg via INTRAVENOUS
  Filled 2016-11-02 (×2): qty 5
  Filled 2016-11-02: qty 550

## 2016-11-02 MED ORDER — PHENYLEPHRINE 40 MCG/ML (10ML) SYRINGE FOR IV PUSH (FOR BLOOD PRESSURE SUPPORT)
PREFILLED_SYRINGE | INTRAVENOUS | Status: AC
  Filled 2016-11-02: qty 10

## 2016-11-02 MED ORDER — SODIUM CHLORIDE 0.9 % IJ SOLN
INTRAMUSCULAR | Status: AC
  Filled 2016-11-02: qty 50

## 2016-11-02 MED ORDER — FENTANYL CITRATE (PF) 100 MCG/2ML IJ SOLN
INTRAMUSCULAR | Status: AC
Start: 2016-11-02 — End: 2016-11-02
  Filled 2016-11-02: qty 2

## 2016-11-02 MED ORDER — DOCUSATE SODIUM 100 MG PO CAPS
100.0000 mg | ORAL_CAPSULE | Freq: Two times a day (BID) | ORAL | Status: DC
  Administered 2016-11-02 – 2016-11-04 (×4): 100 mg via ORAL
  Filled 2016-11-02 (×4): qty 1

## 2016-11-02 MED ORDER — DIPHENHYDRAMINE HCL 12.5 MG/5ML PO ELIX: 12.5000 mg | ORAL_SOLUTION | ORAL | Status: DC | PRN

## 2016-11-02 MED ORDER — ONDANSETRON HCL 4 MG/2ML IJ SOLN: 4.0000 mg | Freq: Four times a day (QID) | INTRAMUSCULAR | Status: DC | PRN

## 2016-11-02 MED ORDER — ONDANSETRON HCL 4 MG/2ML IJ SOLN
INTRAMUSCULAR | Status: AC
  Filled 2016-11-02: qty 2

## 2016-11-02 MED ORDER — PHENYLEPHRINE HCL 10 MG/ML IJ SOLN
INTRAMUSCULAR | Status: DC | PRN
  Administered 2016-11-02: 80 ug via INTRAVENOUS
  Administered 2016-11-02: 40 ug via INTRAVENOUS
  Administered 2016-11-02 (×6): 80 ug via INTRAVENOUS

## 2016-11-02 MED ORDER — PROPOFOL 10 MG/ML IV BOLUS
INTRAVENOUS | Status: AC
  Filled 2016-11-02: qty 20

## 2016-11-02 MED ORDER — KETOROLAC TROMETHAMINE 30 MG/ML IJ SOLN: 30.0000 mg | Freq: Once | INTRAMUSCULAR | Status: DC

## 2016-11-02 MED ORDER — ACETAMINOPHEN 325 MG PO TABS: 650.0000 mg | ORAL_TABLET | Freq: Four times a day (QID) | ORAL | Status: DC | PRN

## 2016-11-02 MED ORDER — 0.9 % SODIUM CHLORIDE (POUR BTL) OPTIME
TOPICAL | Status: DC | PRN
Start: 2016-11-02 — End: 2016-11-02
  Administered 2016-11-02: 1000 mL

## 2016-11-02 MED ORDER — MORPHINE SULFATE (PF) 4 MG/ML IV SOLN: 1.0000 mg | INTRAVENOUS | Status: DC | PRN

## 2016-11-02 MED ORDER — SODIUM CHLORIDE 0.9 % IV SOLN
1000.0000 mg | INTRAVENOUS | Status: AC
  Administered 2016-11-02: 1000 mg via INTRAVENOUS
  Filled 2016-11-02: qty 1100

## 2016-11-02 MED ORDER — ONDANSETRON HCL 4 MG PO TABS
4.0000 mg | ORAL_TABLET | Freq: Four times a day (QID) | ORAL | Status: DC | PRN
  Administered 2016-11-04: 10:00:00 4 mg via ORAL
  Filled 2016-11-02: qty 1

## 2016-11-02 MED ORDER — METOCLOPRAMIDE HCL 5 MG/ML IJ SOLN: 5.0000 mg | Freq: Three times a day (TID) | INTRAMUSCULAR | Status: DC | PRN

## 2016-11-02 MED ORDER — ACETAMINOPHEN 500 MG PO TABS
1000.0000 mg | ORAL_TABLET | Freq: Four times a day (QID) | ORAL | Status: AC
  Administered 2016-11-02 – 2016-11-03 (×4): 1000 mg via ORAL
  Filled 2016-11-02 (×4): qty 2

## 2016-11-02 MED ORDER — SODIUM CHLORIDE 0.9 % IR SOLN
Status: DC | PRN
  Administered 2016-11-02: 1000 mL

## 2016-11-02 MED ORDER — SODIUM CHLORIDE 0.9 % IJ SOLN
INTRAMUSCULAR | Status: DC | PRN
Start: 2016-11-02 — End: 2016-11-02
  Administered 2016-11-02: 30 mL

## 2016-11-02 MED ORDER — TRAMADOL HCL 50 MG PO TABS: 50.0000 mg | ORAL_TABLET | Freq: Four times a day (QID) | ORAL | Status: DC | PRN

## 2016-11-02 MED ORDER — DEXAMETHASONE SODIUM PHOSPHATE 10 MG/ML IJ SOLN
10.0000 mg | Freq: Once | INTRAMUSCULAR | Status: AC
  Administered 2016-11-02: 10 mg via INTRAVENOUS

## 2016-11-02 MED ORDER — ACETAMINOPHEN 650 MG RE SUPP: 650.0000 mg | Freq: Four times a day (QID) | RECTAL | Status: DC | PRN

## 2016-11-02 MED ORDER — PHENOL 1.4 % MT LIQD: 1.0000 | OROMUCOSAL | Status: DC | PRN

## 2016-11-02 MED ORDER — ACETAMINOPHEN 10 MG/ML IV SOLN
1000.0000 mg | Freq: Once | INTRAVENOUS | Status: AC
  Administered 2016-11-02: 1000 mg via INTRAVENOUS

## 2016-11-02 MED ORDER — PROPOFOL 500 MG/50ML IV EMUL
INTRAVENOUS | Status: DC | PRN
  Administered 2016-11-02: 100 ug/kg/min via INTRAVENOUS

## 2016-11-02 MED ORDER — POLYETHYLENE GLYCOL 3350 17 G PO PACK: 17.0000 g | PACK | Freq: Every day | ORAL | Status: DC | PRN

## 2016-11-02 SURGICAL SUPPLY — 52 items
BAG DECANTER FOR FLEXI CONT (MISCELLANEOUS) IMPLANT
BAG SPEC THK2 15X12 ZIP CLS (MISCELLANEOUS) ×1
BAG ZIPLOCK 12X15 (MISCELLANEOUS) ×2 IMPLANT
BANDAGE ACE 6X5 VEL STRL LF (GAUZE/BANDAGES/DRESSINGS) ×2 IMPLANT
BLADE SAG 18X100X1.27 (BLADE) ×2 IMPLANT
BLADE SAW SGTL 11.0X1.19X90.0M (BLADE) ×2 IMPLANT
BOWL SMART MIX CTS (DISPOSABLE) ×2 IMPLANT
CAPT KNEE TOTAL 3 ATTUNE ×2 IMPLANT
CEMENT HV SMART SET (Cement) ×4 IMPLANT
CLOTH BEACON ORANGE TIMEOUT ST (SAFETY) ×2 IMPLANT
CUFF TOURN SGL QUICK 34 (TOURNIQUET CUFF) ×2
CUFF TRNQT CYL 34X4X40X1 (TOURNIQUET CUFF) ×1 IMPLANT
DECANTER SPIKE VIAL GLASS SM (MISCELLANEOUS) ×2 IMPLANT
DRAPE U-SHAPE 47X51 STRL (DRAPES) ×2 IMPLANT
DRSG ADAPTIC 3X8 NADH LF (GAUZE/BANDAGES/DRESSINGS) ×2 IMPLANT
DRSG PAD ABDOMINAL 8X10 ST (GAUZE/BANDAGES/DRESSINGS) ×2 IMPLANT
DURAPREP 26ML APPLICATOR (WOUND CARE) ×2 IMPLANT
ELECT REM PT RETURN 9FT ADLT (ELECTROSURGICAL) ×2
ELECTRODE REM PT RTRN 9FT ADLT (ELECTROSURGICAL) ×1 IMPLANT
EVACUATOR 1/8 PVC DRAIN (DRAIN) ×2 IMPLANT
GAUZE SPONGE 4X4 12PLY STRL (GAUZE/BANDAGES/DRESSINGS) ×2 IMPLANT
GLOVE BIO SURGEON STRL SZ7.5 (GLOVE) ×2 IMPLANT
GLOVE BIO SURGEON STRL SZ8 (GLOVE) ×2 IMPLANT
GLOVE BIOGEL PI IND STRL 6.5 (GLOVE) IMPLANT
GLOVE BIOGEL PI IND STRL 8 (GLOVE) ×1 IMPLANT
GLOVE BIOGEL PI INDICATOR 6.5 (GLOVE)
GLOVE BIOGEL PI INDICATOR 8 (GLOVE) ×1
GLOVE SURG SS PI 6.5 STRL IVOR (GLOVE) IMPLANT
GOWN STRL REUS W/TWL LRG LVL3 (GOWN DISPOSABLE) ×2 IMPLANT
GOWN STRL REUS W/TWL XL LVL3 (GOWN DISPOSABLE) ×1 IMPLANT
HANDPIECE INTERPULSE COAX TIP (DISPOSABLE) ×2
IMMOBILIZER KNEE 20 (SOFTGOODS) ×2
IMMOBILIZER KNEE 20 THIGH 36 (SOFTGOODS) ×1 IMPLANT
MANIFOLD NEPTUNE II (INSTRUMENTS) ×2 IMPLANT
NS IRRIG 1000ML POUR BTL (IV SOLUTION) ×2 IMPLANT
PACK TOTAL KNEE CUSTOM (KITS) ×2 IMPLANT
PADDING CAST COTTON 6X4 STRL (CAST SUPPLIES) ×4 IMPLANT
POSITIONER SURGICAL ARM (MISCELLANEOUS) ×2 IMPLANT
SET HNDPC FAN SPRY TIP SCT (DISPOSABLE) ×1 IMPLANT
STRIP CLOSURE SKIN 1/2X4 (GAUZE/BANDAGES/DRESSINGS) ×4 IMPLANT
SUT MNCRL AB 4-0 PS2 18 (SUTURE) ×2 IMPLANT
SUT STRATAFIX 0 PDS 27 VIOLET (SUTURE) ×2
SUT VIC AB 2-0 CT1 27 (SUTURE) ×6
SUT VIC AB 2-0 CT1 TAPERPNT 27 (SUTURE) ×3 IMPLANT
SUT VLOC 180 0 24IN GS25 (SUTURE) IMPLANT
SUTURE STRATFX 0 PDS 27 VIOLET (SUTURE) ×1 IMPLANT
SYR 50ML LL SCALE MARK (SYRINGE) IMPLANT
TRAY FOLEY CATH 14FRSI W/METER (CATHETERS) ×2 IMPLANT
TRAY FOLEY W/METER SILVER 16FR (SET/KITS/TRAYS/PACK) IMPLANT
WATER STERILE IRR 1000ML POUR (IV SOLUTION) ×4 IMPLANT
WRAP KNEE MAXI GEL POST OP (GAUZE/BANDAGES/DRESSINGS) ×2 IMPLANT
YANKAUER SUCT BULB TIP 10FT TU (MISCELLANEOUS) ×2 IMPLANT

## 2016-11-02 NOTE — Anesthesia Procedure Notes (Signed)
Spinal  Patient location during procedure: OR Start time: 11/02/2016 10:25 AM End time: 11/02/2016 10:29 AM Staffing Anesthesiologist: Lyn Hollingshead Performed: anesthesiologist  Preanesthetic Checklist Completed: patient identified, surgical consent, pre-op evaluation, timeout performed, IV checked, risks and benefits discussed and monitors and equipment checked Spinal Block Patient position: sitting Prep: site prepped and draped and DuraPrep Patient monitoring: heart rate, cardiac monitor, continuous pulse ox and blood pressure Approach: midline Location: L3-4 Injection technique: single-shot Needle Needle type: Pencan  Needle gauge: 24 G Needle length: 9 cm Needle insertion depth: 6 cm Assessment Sensory level: T8

## 2016-11-02 NOTE — H&P (View-Only) (Signed)
Brittany Stark DOB: 06/04/1946 Divorced / Language: English / Race: Black or African American Female Date of Admission:  11/02/2016 CC:  Right knee pain History of Present Illness  The patient is a 71 year old female who comes in for a preoperative History and Physical. The patient is scheduled for a right total knee arthroplasty to be performed by Dr. Dione Plover. Aluisio, MD at [facility] on 11-02-2016. The patient is a 71 year old female who is being followed for their bilateral (right > left) knee pain and osteoarthritis. Symptoms reported include: pain, giving way, instability and pain with weightbearing. The patient feels that they are doing poorly and report their pain level to moderate. Current treatment includes: activity modification and NSAIDs. The following medication has been used for pain control: antiinflammatory medication (Aleve, OTC cream). Note for "Follow-up Knee": The patient states she had Flexogenic injections which did not help. Unfortunately, her knees are getting worse. Right knee is far worse than the left. They hurts her all the time. She has now had cortisone injections and went to [facility] and had there viscosupplement injections, none of which have benefitted her. She is hurting all the time at a high level. It is limiting what she can and cannot do. She even has pain at night. She wants to be more active, but the knees preventing her from doing so. She is ready to get the knees fixed. She will start out with the right knee. They have been treated conservatively in the past for the above stated problem and despite conservative measures, they continue to have progressive pain and severe functional limitations and dysfunction. They have failed non-operative management including home exercise, medications, and injections. It is felt that they would benefit from undergoing total joint replacement. Risks and benefits of the procedure have been discussed with the patient  and they elect to proceed with surgery. There are no active contraindications to surgery such as ongoing infection or rapidly progressive neurological disease.  Problem List/Past Medical Chronic pain of both knees (M25.561, M25.562)  Chronic Pain  Depression  Gastroesophageal Reflux Disease  Osteoarthritis  Rheumatoid Arthritis  Primary osteoarthritis of both knees (M17.0)   Allergies Amoxicillin *PENICILLINS*  yeast infections Doxycycline Hyclate *TETRACYCLINES*  eyes red, tearing Arava *ANALGESICS - ANTI-INFLAMMATORY*  hairloss Methotrexate *ANTINEOPLASTICS AND ADJUNCTIVE THERAPIES*  skin lesions  Family History Depression  Sister. Drug / Alcohol Addiction  Brother. Hypertension  Sister. Osteoarthritis  Paternal Grandmother. Rheumatoid Arthritis  Sister. Father  Cancer, Smoking Mother  Ovarian Cancer  Social History Children  2 Current work status  retired Furniture conservator/restorer daily Living situation  live alone Marital status  divorced Never consumed alcohol  01/08/2016: Never consumed alcohol No history of drug/alcohol rehab  Number of flights of stairs before winded  1 Tobacco / smoke exposure  01/08/2016: no Tobacco use  Never smoker. 01/08/2016 Advance Directives  Healthcare POA  Medication History  BC Powders Active. PredniSONE (5MG  Tablet, Oral as needed) Active. HydroCHLOROthiazide (25MG  Tablet, Oral) Active. Omeprazole (20MG  Capsule DR, Oral) Active. VESIcare (5MG  Tablet, Oral) Active. Orencia (125MG /ML Soln Pref Syr, Subcutaneous) Active. Aspirin (81MG  Tablet, Oral) Active. Vitamin E (Oral) Specific strength unknown - Active. Multiple Vitamin (1 (one) Oral) Active. Calcium Citrate (1 (one) Oral) Specific strength unknown - Active.   Past Surgical History  Ankle Surgery  right Arthroscopy of Knee  right Breast Reconstruction  bilateral Cataract Surgery  bilateral Colon Polyp Removal - Colonoscopy   Hysterectomy  partial (non-cancerous)  Review  of Systems General Not Present- Chills, Fatigue, Fever, Memory Loss, Night Sweats, Weight Gain and Weight Loss. Skin Not Present- Eczema, Hives, Itching, Lesions and Rash. HEENT Not Present- Dentures, Double Vision, Headache, Hearing Loss, Tinnitus and Visual Loss. Respiratory Not Present- Allergies, Chronic Cough, Coughing up blood, Shortness of breath at rest and Shortness of breath with exertion. Cardiovascular Not Present- Chest Pain, Difficulty Breathing Lying Down, Murmur, Palpitations, Racing/skipping heartbeats and Swelling. Gastrointestinal Present- Constipation. Not Present- Abdominal Pain, Bloody Stool, Diarrhea, Difficulty Swallowing, Heartburn, Jaundice, Loss of appetitie, Nausea and Vomiting. Female Genitourinary Present- Urinating at Night. Not Present- Blood in Urine, Discharge, Flank Pain, Incontinence, Painful Urination, Urgency, Urinary frequency, Urinary Retention and Weak urinary stream. Musculoskeletal Present- Morning Stiffness. Not Present- Back Pain, Joint Pain, Joint Swelling, Muscle Pain, Muscle Weakness and Spasms. Neurological Not Present- Blackout spells, Difficulty with balance, Dizziness, Paralysis, Tremor and Weakness. Psychiatric Not Present- Insomnia.  Vitals  Weight: 180 lb Height: 68in Body Surface Area: 1.95 m Body Mass Index: 27.37 kg/m  Pulse: 64 (Regular)  BP: 118/82 (Sitting, Left Arm, Standard)   Physical Exam General Mental Status -Alert, cooperative and good historian. General Appearance-pleasant, Not in acute distress. Orientation-Oriented X3. Build & Nutrition-Well nourished and Well developed.  Head and Neck Head-normocephalic, atraumatic . Neck Global Assessment - supple, no bruit auscultated on the right, no bruit auscultated on the left.  Eye Vision-Wears corrective lenses(readers only). Pupil - Bilateral-Regular and Round. Motion -  Bilateral-EOMI.  Chest and Lung Exam Auscultation Breath sounds - clear at anterior chest wall and clear at posterior chest wall. Adventitious sounds - No Adventitious sounds.  Cardiovascular Auscultation Rhythm - Regular rate and rhythm. Heart Sounds - S1 WNL and S2 WNL. Murmurs & Other Heart Sounds - Auscultation of the heart reveals - No Murmurs.  Abdomen Palpation/Percussion Tenderness - Abdomen is non-tender to palpation. Rigidity (guarding) - Abdomen is soft. Auscultation Auscultation of the abdomen reveals - Bowel sounds normal.  Female Genitourinary Note: Not done, not pertinent to present illness   Musculoskeletal Note: Well-developed female, in no distress. Both knees show no effusion. There is marked crepitus on range of motion of each knee. On her right knee, she is very tender medially with no lateral tenderness or instability. Range of motion about 5 to 115 or 120. Left knee 0 to 125 with no instability. Pulse, sensation, motor intact. She has significant antalgic gait pattern.  RADIOGRAPHS AP and lateral of the knees show that she has got severe bone-on-bone arthritis, medial and patellofemoral compartments of both worse on the right than the left.  Assessment & Plan  Primary osteoarthritis of left knee (M17.12) Primary osteoarthritis of right knee (M17.11)  Note:Surgical Plans: Right Total Knee Replacement  Disposition: Home with family. Straight to outpatient therapy at [facility] to start on Friday 11/06/2016. Prescription for Physical Therapy given.  PCP: Dr. Ashby Dawes  IV TXA  Anesthesia Issues: None  Patient was instructed on what medications to stop prior to surgery.  Signed electronically by Ok Edwards, III PA-C

## 2016-11-02 NOTE — Addendum Note (Signed)
Addendum  created 11/02/16 1715 by Lollie Sails, CRNA   Anesthesia Intra Meds edited

## 2016-11-02 NOTE — Anesthesia Postprocedure Evaluation (Addendum)
Anesthesia Post Note  Patient: Brittany Stark  Procedure(s) Performed: Procedure(s) (LRB): RIGHT TOTAL KNEE ARTHROPLASTY (Right)  Patient location during evaluation: PACU Anesthesia Type: Spinal Level of consciousness: awake Pain management: pain level controlled Vital Signs Assessment: post-procedure vital signs reviewed and stable Respiratory status: patient connected to nasal cannula oxygen Cardiovascular status: stable Postop Assessment: no headache, spinal receding, no backache, no signs of nausea or vomiting and patient able to bend at knees Anesthetic complications: no        Last Vitals:  Vitals:   11/02/16 1200 11/02/16 1215  BP: 106/68 116/66  Pulse: (!) 57 60  Resp: 14 14  Temp:      Last Pain:  Vitals:   11/02/16 1215  TempSrc:   PainSc: 0-No pain   Pain Goal: Patients Stated Pain Goal: 3 (11/02/16 0741)               Abygayle Deltoro JR,JOHN Mateo Flow

## 2016-11-02 NOTE — Interval H&P Note (Signed)
History and Physical Interval Note:  11/02/2016 9:26 AM  Brittany Stark  has presented today for surgery, with the diagnosis of right knee osteoarthritis  The various methods of treatment have been discussed with the patient and family. After consideration of risks, benefits and other options for treatment, the patient has consented to  Procedure(s): RIGHT TOTAL KNEE ARTHROPLASTY (Right) as a surgical intervention .  The patient's history has been reviewed, patient examined, no change in status, stable for surgery.  I have reviewed the patient's chart and labs.  Questions were answered to the patient's satisfaction.     Gearlean Alf

## 2016-11-02 NOTE — Addendum Note (Signed)
Addendum  created 11/02/16 1258 by Lind Covert, CRNA   Anesthesia Intra Flowsheets edited

## 2016-11-02 NOTE — Anesthesia Preprocedure Evaluation (Signed)
Anesthesia Evaluation  Patient identified by MRN, date of birth, ID band Patient awake    Reviewed: Allergy & Precautions, NPO status , Patient's Chart, lab work & pertinent test results  Airway Mallampati: I       Dental no notable dental hx.    Pulmonary neg pulmonary ROS,    Pulmonary exam normal        Cardiovascular hypertension, Pt. on medications negative cardio ROS Normal cardiovascular exam     Neuro/Psych negative neurological ROS     GI/Hepatic Neg liver ROS, GERD  Medicated and Controlled,  Endo/Other  negative endocrine ROS  Renal/GU negative Renal ROS  negative genitourinary   Musculoskeletal negative musculoskeletal ROS (+)   Abdominal Normal abdominal exam  (+)   Peds negative pediatric ROS (+)  Hematology negative hematology ROS (+)   Anesthesia Other Findings   Reproductive/Obstetrics negative OB ROS                             Anesthesia Physical Anesthesia Plan  ASA: II  Anesthesia Plan: Spinal   Post-op Pain Management:  Regional for Post-op pain   Induction:   Airway Management Planned:   Additional Equipment:   Intra-op Plan:   Post-operative Plan:   Informed Consent:   Plan Discussed with: CRNA and Surgeon  Anesthesia Plan Comments:         Anesthesia Quick Evaluation

## 2016-11-02 NOTE — Transfer of Care (Signed)
Immediate Anesthesia Transfer of Care Note  Patient: Brittany Stark  Procedure(s) Performed: Procedure(s) with comments: RIGHT TOTAL KNEE ARTHROPLASTY (Right) - with abductor block  Patient Location: PACU  Anesthesia Type:Regional and Spinal  Level of Consciousness: sedated  Airway & Oxygen Therapy: Patient Spontanous Breathing and Patient connected to face mask oxygen  Post-op Assessment: Report given to RN and Post -op Vital signs reviewed and stable  Post vital signs: Reviewed and stable  Last Vitals:  Vitals:   11/02/16 1008 11/02/16 1010  BP:  128/70  Pulse: 66 65  Resp: 12 14  Temp:      Last Pain:  Vitals:   11/02/16 0715  TempSrc: Oral      Patients Stated Pain Goal: 3 ([id number])  Complications: No apparent anesthesia complications

## 2016-11-02 NOTE — Progress Notes (Signed)
Assisted Dr. Hatchett with right, ultrasound guided, adductor canal block. Side rails up, monitors on throughout procedure. See vital signs in flow sheet. Tolerated Procedure well.  

## 2016-11-02 NOTE — Anesthesia Procedure Notes (Addendum)
Anesthesia Regional Block: Adductor canal block   Pre-Anesthetic Checklist: ,, timeout performed, Correct Patient, Correct Site, Correct Laterality, Correct Procedure, Correct Position, site marked, Risks and benefits discussed,  Surgical consent,  Pre-op evaluation,  At surgeon's request and post-op pain management  Laterality: Right and Lower  Prep: chloraprep       Needles:  Injection technique: Single-shot  Needle Type: Echogenic Stimulator Needle     Needle Length: 9cm  Needle Gauge: 21     Additional Needles:   Procedures: ultrasound guided,,,,,,,,  Narrative:  Start time: 11/02/2016 9:45 AM End time: 11/02/2016 9:50 AM Injection made incrementally with aspirations every 5 mL. Anesthesiologist: Lyn Hollingshead

## 2016-11-02 NOTE — Op Note (Signed)
OPERATIVE REPORT-TOTAL KNEE ARTHROPLASTY   Pre-operative diagnosis- Osteoarthritis  Right knee(s)  Post-operative diagnosis- Osteoarthritis Right knee(s)  Procedure-  Right  Total Knee Arthroplasty (Depuy Attune)  Surgeon- Dione Plover. Revia Nghiem, MD  Assistant- Arlee Muslim, PA-C   Anesthesia-  Adductor canal block and spinal  EBL-* No blood loss amount entered *   Drains Hemovac  Tourniquet time-  Total Tourniquet Time Documented: Thigh (Right) - 29 minutes Total: Thigh (Right) - 29 minutes     Complications- None  Condition-PACU - hemodynamically stable.   Brief Clinical Note  LATIERRA Stark is a 71 y.o. year old female with end stage OA of her right knee with progressively worsening pain and dysfunction. She has constant pain, with activity and at rest and significant functional deficits with difficulties even with ADLs. She has had extensive non-op management including analgesics, injections of cortisone and viscosupplements, and home exercise program, but remains in significant pain with significant dysfunction.Radiographs show bone on bone arthritis lateral and patellofemoral. She presents now for right Total Knee Arthroplasty.    Procedure in detail---   The patient is brought into the operating room and positioned supine on the operating table. After successful administration of Adductor canal block and spinal ,   a tourniquet is placed high on the  Right thigh(s) and the lower extremity is prepped and draped in the usual sterile fashion. Time out is performed by the operating team and then the  Right lower extremity is wrapped in Esmarch, knee flexed and the tourniquet inflated to 300 mmHg.       A midline incision is made with a ten blade through the subcutaneous tissue to the level of the extensor mechanism. A fresh blade is used to make a medial parapatellar arthrotomy. Soft tissue over the proximal medial tibia is subperiosteally elevated to the joint line with a knife  and into the semimembranosus bursa with a Cobb elevator. Soft tissue over the proximal lateral tibia is elevated with attention being paid to avoiding the patellar tendon on the tibial tubercle. The patella is everted, knee flexed 90 degrees and the ACL and PCL are removed. Findings are bone on bone lateral and patellofemoral with large lateral and patellar osteophytes.        The drill is used to create a starting hole in the distal femur and the canal is thoroughly irrigated with sterile saline to remove the fatty contents. The 5 degree Right  valgus alignment guide is placed into the femoral canal and the distal femoral cutting block is pinned to remove 10 mm off the distal femur. Resection is made with an oscillating saw.      The tibia is subluxed forward and the menisci are removed. The extramedullary alignment guide is placed referencing proximally at the medial aspect of the tibial tubercle and distally along the second metatarsal axis and tibial crest. The block is pinned to remove 81mm off the more deficient lateral  side. Resection is made with an oscillating saw. Size 5is the most appropriate size for the tibia and the proximal tibia is prepared with the modular drill and keel punch for that size.      The femoral sizing guide is placed and size 5 is most appropriate. Rotation is marked off the epicondylar axis and confirmed by creating a rectangular flexion gap at 90 degrees. The size 5 cutting block is pinned in this rotation and the anterior, posterior and chamfer cuts are made with the oscillating saw. The intercondylar block is  then placed and that cut is made.      Trial size 5 tibial component, trial size 5 posterior stabilized femur and a 10  mm posterior stabilized rotating platform insert trial is placed. Full extension is achieved with excellent varus/valgus and anterior/posterior balance throughout full range of motion. The patella is everted and thickness measured to be 21  mm. Free hand  resection is taken to 12 mm, a 35 template is placed, lug holes are drilled, trial patella is placed, and it tracks normally. Osteophytes are removed off the posterior femur with the trial in place. All trials are removed and the cut bone surfaces prepared with pulsatile lavage. Cement is mixed and once ready for implantation, the size 5 tibial implant, size  5 posterior stabilized femoral component, and the size 35 patella are cemented in place and the patella is held with the clamp. The trial insert is placed and the knee held in full extension. The Exparel (20 ml mixed with 30 ml saline) and .25% Bupivicaine, are injected into the extensor mechanism, posterior capsule, medial and lateral gutters and subcutaneous tissues.  All extruded cement is removed and once the cement is hard the permanent 10 mm posterior stabilized rotating platform insert is placed into the tibial tray.      The wound is copiously irrigated with saline solution and the extensor mechanism closed over a hemovac drain with #1 V-loc suture. The tourniquet is released for a total tourniquet time of 28  minutes. Flexion against gravity is 140 degrees and the patella tracks normally. Subcutaneous tissue is closed with 2.0 vicryl and subcuticular with running 4.0 Monocryl. The incision is cleaned and dried and steri-strips and a bulky sterile dressing are applied. The limb is placed into a knee immobilizer and the patient is awakened and transported to recovery in stable condition.      Please note that a surgical assistant was a medical necessity for this procedure in order to perform it in a safe and expeditious manner. Surgical assistant was necessary to retract the ligaments and vital neurovascular structures to prevent injury to them and also necessary for proper positioning of the limb to allow for anatomic placement of the prosthesis.   Dione Plover Maddie Brazier, MD    11/02/2016, 11:19 AM

## 2016-11-03 LAB — BASIC METABOLIC PANEL
ANION GAP: 6 (ref 5–15)
BUN: 9 mg/dL (ref 6–20)
CO2: 27 mmol/L (ref 22–32)
Calcium: 8.7 mg/dL — ABNORMAL LOW (ref 8.9–10.3)
Chloride: 105 mmol/L (ref 101–111)
Creatinine, Ser: 0.68 mg/dL (ref 0.44–1.00)
GFR calc Af Amer: >60 mL/min (ref >60)
GLUCOSE: 125 mg/dL — AB (ref 65–99)
Potassium: 3.6 mmol/L (ref 3.5–5.1)
Sodium: 138 mmol/L (ref 135–145)

## 2016-11-03 LAB — CBC
HEMATOCRIT: 30.7 % — AB (ref 36.0–46.0)
Hemoglobin: 9.8 g/dL — ABNORMAL LOW (ref 12.0–15.0)
MCH: 29.3 pg (ref 26.0–34.0)
MCHC: 31.9 g/dL (ref 30.0–36.0)
MCV: 91.9 fL (ref 78.0–100.0)
Platelets: 235 10*3/uL (ref 150–400)
RBC: 3.34 MIL/uL — AB (ref 3.87–5.11)
RDW: 14.8 % (ref 11.5–15.5)
WBC: 8 10*3/uL (ref 4.0–10.5)

## 2016-11-03 MED ORDER — RIVAROXABAN 10 MG PO TABS: 10.0000 mg | ORAL_TABLET | Freq: Every day | ORAL | 0 refills | Status: DC

## 2016-11-03 MED ORDER — NON FORMULARY: 40.0000 mg | Freq: Every day | Status: DC

## 2016-11-03 MED ORDER — OMEPRAZOLE 20 MG PO CPDR
40.0000 mg | DELAYED_RELEASE_CAPSULE | Freq: Every day | ORAL | Status: DC
  Administered 2016-11-03 – 2016-11-04 (×2): 40 mg via ORAL
  Filled 2016-11-03 (×2): qty 2

## 2016-11-03 MED ORDER — OXYCODONE HCL 5 MG PO TABS: 5.0000 mg | ORAL_TABLET | ORAL | 0 refills | Status: DC | PRN

## 2016-11-03 MED ORDER — TRAMADOL HCL 50 MG PO TABS: 50.0000 mg | ORAL_TABLET | Freq: Four times a day (QID) | ORAL | 0 refills | Status: DC | PRN

## 2016-11-03 MED ORDER — METHOCARBAMOL 500 MG PO TABS: 500.0000 mg | ORAL_TABLET | Freq: Four times a day (QID) | ORAL | 0 refills | Status: DC | PRN

## 2016-11-03 NOTE — Progress Notes (Signed)
Physical Therapy Treatment Patient Details Name: Brittany Stark MRN: [id number] DOB: 02/04/1946 Today's Date: 11/03/2016    History of Present Illness 71 yo female s/p R TKA 11/02/16.     PT Comments    Progressing with mobility.    Follow Up Recommendations  Outpatient PT     Equipment Recommendations  None recommended by PT    Recommendations for Other Services       Precautions / Restrictions Precautions Precautions: Fall;Knee Precaution Comments: pt able to SLR-KI not used Required Braces or Orthoses: Knee Immobilizer - Right Knee Immobilizer - Right: Discontinue once straight leg raise with < 10 degree lag Restrictions Weight Bearing Restrictions: No RLE Weight Bearing: Weight bearing as tolerated    Mobility  Bed Mobility Overal bed mobility: Needs Assistance Bed Mobility: Supine to Sit;Sit to Supine     Supine to sit: Supervision Sit to supine: Supervision      Transfers Overall transfer level: Needs assistance Equipment used: Rolling walker (2 wheeled) Transfers: Sit to/from Stand Sit to Stand: Supervision         General transfer comment: Multiple attempts and increased time from low surface. VCs safety, technique, hand/LE placement.   Ambulation/Gait Ambulation/Gait assistance: Min guard Ambulation Distance (Feet): 75 Feet Assistive device: Rolling walker (2 wheeled) Gait Pattern/deviations: Step-to pattern     General Gait Details: Cues for safety, posture, distance from RW. slow but steady gait speed.    Stairs            Wheelchair Mobility    Modified Rankin (Stroke Patients Only)       Balance                                    Cognition Arousal/Alertness: Awake/alert Behavior During Therapy: WFL for tasks assessed/performed Overall Cognitive Status: Within Functional Limits for tasks assessed                      Exercises Total Joint Exercises Ankle Circles/Pumps: AROM;Both;10  reps;Supine Quad Sets: AROM;Both;10 reps;Supine Hip ABduction/ADduction: AROM;Right;10 reps;Supine Straight Leg Raises: AROM;Right;10 reps;Supine Knee Flexion: AROM;Right;10 reps;Seated Goniometric ROM: ~5-85 degrees    General Comments        Pertinent Vitals/Pain Pain Assessment: 0-10 Pain Score: 5  Pain Location: R knee with activity Pain Descriptors / Indicators: Sore Pain Intervention(s): Monitored during session;Repositioned    Home Living Family/patient expects to be discharged to:: Private residence Living Arrangements: Spouse/significant other Available Help at Discharge: Family Type of Home: House Home Access: Level entry   Home Layout: One level Home Equipment: Environmental consultant - 2 wheels;Bedside commode      Prior Function Level of Independence: Independent      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   PT Goals (current goals can now be found in the care plan section) Acute Rehab PT Goals Patient Stated Goal: regain independence and PLOF PT Goal Formulation: With patient Time For Goal Achievement: 11/17/16 Potential to Achieve Goals: Good Progress towards PT goals: Progressing toward goals    Frequency    7X/week      PT Plan Current plan remains appropriate    Co-evaluation             End of Session   Activity Tolerance: Patient tolerated treatment well Patient left: in bed;with call bell/phone within reach;with family/visitor present   PT Visit Diagnosis: Difficulty in walking, not elsewhere classified (R26.2)  Time: [id number] PT Time Calculation (min) (ACUTE ONLY): 12 min  Charges:  $Gait Training: 8-22 mins $Therapeutic Exercise: 8-22 mins                    G Codes:       Weston Anna, MPT Pager: [phone]

## 2016-11-03 NOTE — Discharge Summary (Signed)
Physician Discharge Summary   Patient ID: Brittany Stark MRN: [id number] DOB/AGE: 16-Sep-1945 71 y.o.  Admit date: 11/02/2016 Discharge date: 11/04/2016  Primary Diagnosis:  Osteoarthritis  Right knee(s)  Admission Diagnoses:  Past Medical History:  Diagnosis Date  . Arthritis    rheumatoid and osteoarthritis   . Depression   . Elevated cholesterol    currently under control  . GERD (gastroesophageal reflux disease)    laryngopharyngeal reflux  . Hemorrhoids   . Hypertension    Discharge Diagnoses:   Principal Problem:   OA (osteoarthritis) of knee  Estimated body mass index is 27.89 kg/m as calculated from the following:   Height as of this encounter: _0  (1.702 m).   Weight as of this encounter: 80.8 kg (178 lb 1.6 oz).  Procedure:  Procedure(s) (LRB): RIGHT TOTAL KNEE ARTHROPLASTY (Right)   Consults: None  HPI: Brittany Stark is a 71 y.o. year old female with end stage OA of her right knee with progressively worsening pain and dysfunction. She has constant pain, with activity and at rest and significant functional deficits with difficulties even with ADLs. She has had extensive non-op management including analgesics, injections of cortisone and viscosupplements, and home exercise program, but remains in significant pain with significant dysfunction.Radiographs show bone on bone arthritis lateral and patellofemoral. She presents now for right Total Knee Arthroplasty.  Laboratory Data: Admission on 11/02/2016  Component Date Value Ref Range Status  . WBC 11/03/2016 8.0  4.0 - 10.5 K/uL Final  . RBC 11/03/2016 3.34* 3.87 - 5.11 MIL/uL Final  . Hemoglobin 11/03/2016 9.8* 12.0 - 15.0 g/dL Final  . HCT 11/03/2016 30.7* 36.0 - 46.0 % Final  . MCV 11/03/2016 91.9  78.0 - 100.0 fL Final  . MCH 11/03/2016 29.3  26.0 - 34.0 pg Final  . MCHC 11/03/2016 31.9  30.0 - 36.0 g/dL Final  . RDW 11/03/2016 14.8  11.5 - 15.5 % Final  . Platelets 11/03/2016 235  150 - 400 K/uL Final    . Sodium 11/03/2016 138  135 - 145 mmol/L Final  . Potassium 11/03/2016 3.6  3.5 - 5.1 mmol/L Final  . Chloride 11/03/2016 105  101 - 111 mmol/L Final  . CO2 11/03/2016 27  22 - 32 mmol/L Final  . Glucose, Bld 11/03/2016 125* 65 - 99 mg/dL Final  . BUN 11/03/2016 9  6 - 20 mg/dL Final  . Creatinine, Ser 11/03/2016 0.68  0.44 - 1.00 mg/dL Final  . Calcium 11/03/2016 8.7* 8.9 - 10.3 mg/dL Final  . GFR calc non Af Amer 11/03/2016 >60  >60 mL/min Final  . GFR calc Af Amer 11/03/2016 >60  >60 mL/min Final   Comment: (NOTE) The eGFR has been calculated using the CKD EPI equation. This calculation has not been validated in all clinical situations. eGFR's persistently <60 mL/min signify possible Chronic Kidney Disease.   . Anion gap 11/03/2016 6  5 - 15 Final  . WBC 11/04/2016 9.3  4.0 - 10.5 K/uL Final  . RBC 11/04/2016 3.04* 3.87 - 5.11 MIL/uL Final  . Hemoglobin 11/04/2016 8.8* 12.0 - 15.0 g/dL Final  . HCT 11/04/2016 27.5* 36.0 - 46.0 % Final  . MCV 11/04/2016 90.5  78.0 - 100.0 fL Final  . MCH 11/04/2016 28.9  26.0 - 34.0 pg Final  . MCHC 11/04/2016 32.0  30.0 - 36.0 g/dL Final  . RDW 11/04/2016 15.1  11.5 - 15.5 % Final  . Platelets 11/04/2016 237  150 - 400 K/uL Final  .  Sodium 11/04/2016 142  135 - 145 mmol/L Final  . Potassium 11/04/2016 3.6  3.5 - 5.1 mmol/L Final  . Chloride 11/04/2016 108  101 - 111 mmol/L Final  . CO2 11/04/2016 29  22 - 32 mmol/L Final  . Glucose, Bld 11/04/2016 125* 65 - 99 mg/dL Final  . BUN 11/04/2016 11  6 - 20 mg/dL Final  . Creatinine, Ser 11/04/2016 0.70  0.44 - 1.00 mg/dL Final  . Calcium 11/04/2016 8.7* 8.9 - 10.3 mg/dL Final  . GFR calc non Af Amer 11/04/2016 >60  >60 mL/min Final  . GFR calc Af Amer 11/04/2016 >60  >60 mL/min Final   Comment: (NOTE) The eGFR has been calculated using the CKD EPI equation. This calculation has not been validated in all clinical situations. eGFR's persistently <60 mL/min signify possible Chronic  Kidney Disease.   Georgiann Hahn gap 11/04/2016 5  5 - 15 Final  Hospital Outpatient Visit on 10/26/2016  Component Date Value Ref Range Status  . aPTT 10/26/2016 29  24 - 36 seconds Final  . WBC 10/26/2016 4.7  4.0 - 10.5 K/uL Final  . RBC 10/26/2016 3.80* 3.87 - 5.11 MIL/uL Final  . Hemoglobin 10/26/2016 11.2* 12.0 - 15.0 g/dL Final  . HCT 10/26/2016 34.7* 36.0 - 46.0 % Final  . MCV 10/26/2016 91.3  78.0 - 100.0 fL Final  . MCH 10/26/2016 29.5  26.0 - 34.0 pg Final  . MCHC 10/26/2016 32.3  30.0 - 36.0 g/dL Final  . RDW 10/26/2016 14.9  11.5 - 15.5 % Final  . Platelets 10/26/2016 212  150 - 400 K/uL Final  . Sodium 10/26/2016 142  135 - 145 mmol/L Final  . Potassium 10/26/2016 3.0* 3.5 - 5.1 mmol/L Final  . Chloride 10/26/2016 109  101 - 111 mmol/L Final  . CO2 10/26/2016 26  22 - 32 mmol/L Final  . Glucose, Bld 10/26/2016 84  65 - 99 mg/dL Final  . BUN 10/26/2016 11  6 - 20 mg/dL Final  . Creatinine, Ser 10/26/2016 0.78  0.44 - 1.00 mg/dL Final  . Calcium 10/26/2016 8.9  8.9 - 10.3 mg/dL Final  . Total Protein 10/26/2016 8.0  6.5 - 8.1 g/dL Final  . Albumin 10/26/2016 3.2* 3.5 - 5.0 g/dL Final  . AST 10/26/2016 20  15 - 41 U/L Final  . ALT 10/26/2016 13* 14 - 54 U/L Final  . Alkaline Phosphatase 10/26/2016 84  38 - 126 U/L Final  . Total Bilirubin 10/26/2016 0.3  0.3 - 1.2 mg/dL Final  . GFR calc non Af Amer 10/26/2016 >60  >60 mL/min Final  . GFR calc Af Amer 10/26/2016 >60  >60 mL/min Final   Comment: (NOTE) The eGFR has been calculated using the CKD EPI equation. This calculation has not been validated in all clinical situations. eGFR's persistently <60 mL/min signify possible Chronic Kidney Disease.   . Anion gap 10/26/2016 7  5 - 15 Final  . Prothrombin Time 10/26/2016 13.8  11.4 - 15.2 seconds Final  . INR 10/26/2016 1.06   Final  . ABO/RH(D) 10/26/2016 A POS   Final  . Antibody Screen 10/26/2016 NEG   Final  . Sample Expiration 10/26/2016 11/05/2016   Final  . Extend  sample reason 10/26/2016 NO TRANSFUSIONS OR PREGNANCY IN THE PAST 3 MONTHS   Final  . MRSA, PCR 10/26/2016 NEGATIVE  NEGATIVE Final  . Staphylococcus aureus 10/26/2016 NEGATIVE  NEGATIVE Final   Comment:        The Xpert SA  Assay (FDA approved for NASAL specimens in patients over 41 years of age), is one component of a comprehensive surveillance program.  Test performance has been validated by [facility] for patients greater than or equal to 14 year old. It is not intended to diagnose infection nor to guide or monitor treatment.   . ABO/RH(D) 10/26/2016 A POS   Final     X-Rays:No results found.  EKG:No orders found for this or any previous visit.   Hospital Course: Brittany Stark is a 71 y.o. who was admitted to [facility]. They were brought to the operating room on 11/02/2016 and underwent Procedure(s): RIGHT TOTAL KNEE ARTHROPLASTY.  Patient tolerated the procedure well and was later transferred to the recovery room and then to the orthopaedic floor for postoperative care.  They were given PO and IV analgesics for pain control following their surgery.  They were given 24 hours of postoperative antibiotics of  Anti-infectives    Start     Dose/Rate Route Frequency Ordered Stop   11/02/16 1630  ceFAZolin (ANCEF) IVPB 2g/100 mL premix     2 g 200 mL/hr over 30 Minutes Intravenous Every 6 hours 11/02/16 1335 11/02/16 2200   11/02/16 0712  vancomycin (VANCOCIN) IVPB 1000 mg/200 mL premix     1,000 mg 200 mL/hr over 60 Minutes Intravenous On call to O.R. 11/02/16 7673 11/02/16 1042     and started on DVT prophylaxis in the form of Xarelto.   PT and OT were ordered for total joint protocol.  Discharge planning consulted to help with postop disposition and equipment needs.  Patient had a decent night on the evening of surgery.  They started to get up OOB with therapy on day one. Hemovac drain was pulled without difficulty.  Continued to work with therapy into day two.  Dressing  was changed on day two and the incision was healing well. Patient was seen in rounds on POD 2 and was ready to go home.  Discharge home - no home health - straight to outpatient on 2/23 Diet - Cardiac diet Follow up - in 2 weeks Activity - WBAT Disposition - Home Condition Upon Discharge - Good D/C Meds - See DC Summary DVT Prophylaxis - Xarelto  Discharge Instructions    Call MD / Call 911    Complete by:  As directed    If you experience chest pain or shortness of breath, CALL 911 and be transported to the hospital emergency room.  If you develope a fever above 101 F, pus (white drainage) or increased drainage or redness at the wound, or calf pain, call your surgeon's office.   Change dressing    Complete by:  As directed    Change dressing daily with sterile 4 x 4 inch gauze dressing and apply TED hose. Do not submerge the incision under water.   Constipation Prevention    Complete by:  As directed    Drink plenty of fluids.  Prune juice may be helpful.  You may use a stool softener, such as Colace (over the counter) 100 mg twice a day.  Use MiraLax (over the counter) for constipation as needed.   Diet - low sodium heart healthy    Complete by:  As directed    Discharge instructions    Complete by:  As directed    Pick up stool softner and laxative for home use following surgery while on pain medications. Do not submerge incision under water. Please use good hand  washing techniques while changing dressing each day. May shower starting three days after surgery. Please use a clean towel to pat the incision dry following showers. Continue to use ice for pain and swelling after surgery. Do not use any lotions or creams on the incision until instructed by your surgeon.  Wear both TED hose on both legs during the day every day for three weeks, but may have off at night at home.  Postoperative Constipation Protocol  Constipation - defined medically as fewer than three stools per week  and severe constipation as less than one stool per week.  One of the most common issues patients have following surgery is constipation.  Even if you have a regular bowel pattern at home, your normal regimen is likely to be disrupted due to multiple reasons following surgery.  Combination of anesthesia, postoperative narcotics, change in appetite and fluid intake all can affect your bowels.  In order to avoid complications following surgery, here are some recommendations in order to help you during your recovery period.  Colace (docusate) - Pick up an over-the-counter form of Colace or another stool softener and take twice a day as long as you are requiring postoperative pain medications.  Take with a full glass of water daily.  If you experience loose stools or diarrhea, hold the colace until you stool forms back up.  If your symptoms do not get better within 1 week or if they get worse, check with your doctor.  Dulcolax (bisacodyl) - Pick up over-the-counter and take as directed by the product packaging as needed to assist with the movement of your bowels.  Take with a full glass of water.  Use this product as needed if not relieved by Colace only.   MiraLax (polyethylene glycol) - Pick up over-the-counter to have on hand.  MiraLax is a solution that will increase the amount of water in your bowels to assist with bowel movements.  Take as directed and can mix with a glass of water, juice, soda, coffee, or tea.  Take if you go more than two days without a movement. Do not use MiraLax more than once per day. Call your doctor if you are still constipated or irregular after using this medication for 7 days in a row.  If you continue to have problems with postoperative constipation, please contact the office for further assistance and recommendations.  If you experience "the worst abdominal pain ever" or develop nausea or vomiting, please contact the office immediatly for further recommendations for  treatment.   Take Xarelto for two and a half more weeks, then discontinue Xarelto. Once the patient has completed the blood thinner regimen, then take a Baby 81 mg Aspirin daily for three more weeks.   Do not put a pillow under the knee. Place it under the heel.    Complete by:  As directed    Do not sit on low chairs, stoools or toilet seats, as it may be difficult to get up from low surfaces    Complete by:  As directed    Driving restrictions    Complete by:  As directed    No driving until released by the physician.   Increase activity slowly as tolerated    Complete by:  As directed    Lifting restrictions    Complete by:  As directed    No lifting until released by the physician.   Patient may shower    Complete by:  As directed  You may shower without a dressing once there is no drainage.  Do not wash over the wound.  If drainage remains, do not shower until drainage stops.   TED hose    Complete by:  As directed    Use stockings (TED hose) for 3 weeks on both leg(s).  You may remove them at night for sleeping.   Weight bearing as tolerated    Complete by:  As directed    Laterality:  right   Extremity:  Lower     Allergies as of 11/04/2016      Reactions   Amoxicillin Other (See Comments)   Yeast infection  Has patient had a PCN reaction causing immediate rash, facial/tongue/throat swelling, SOB or lightheadedness with hypotension: No Has patient had a PCN reaction causing severe rash involving mucus membranes or skin necrosis: No Has patient had a PCN reaction that required hospitalization No Has patient had a PCN reaction occurring within the last 10 years: No If all of the above answers are "NO", then may proceed with Cephalosporin use.   Arava [leflunomide] Other (See Comments)   hairloss    Doxycycline Itching, Other (See Comments)   Eyes red and tearing    Methotrexate Derivatives Other (See Comments)   The injectable causes skin lesions       Medication  List    STOP taking these medications   BIOTIN PO   CALCIUM 600+D PO   multivitamin with minerals Tabs tablet   VITAMIN C PO   VITAMIN D PO   VITAMIN E PO     TAKE these medications   amLODipine 2.5 MG tablet Commonly known as:  NORVASC Take 2.5 mg by mouth daily.   iron polysaccharides 150 MG capsule Commonly known as:  NIFEREX Take 1 capsule (150 mg total) by mouth 2 (two) times daily.   methocarbamol 500 MG tablet Commonly known as:  ROBAXIN Take 1 tablet (500 mg total) by mouth every 6 (six) hours as needed for muscle spasms.   omeprazole 20 MG tablet Commonly known as:  PRILOSEC OTC Take 2 tablets (40 mg total) by mouth daily. What changed:  how much to take   oxyCODONE 5 MG immediate release tablet Commonly known as:  Oxy IR/ROXICODONE Take 1-2 tablets (5-10 mg total) by mouth every 4 (four) hours as needed for moderate pain or severe pain.   predniSONE 5 MG tablet Commonly known as:  DELTASONE Take 5 mg by mouth daily as needed (arthritis).   rivaroxaban 10 MG Tabs tablet Commonly known as:  XARELTO Take 1 tablet (10 mg total) by mouth daily with breakfast. Take Xarelto for two and a half more weeks following discharge from the hospital, then discontinue Xarelto. Once the patient has completed the blood thinner regimen, then take a Baby 81 mg Aspirin daily for three more weeks.   traMADol 50 MG tablet Commonly known as:  ULTRAM Take 1-2 tablets (50-100 mg total) by mouth every 6 (six) hours as needed for moderate pain.   VESICARE 10 MG tablet Generic drug:  solifenacin Take 10 mg by mouth daily.      Follow-up Information    Gearlean Alf, MD. Schedule an appointment as soon as possible for a visit on 11/17/2016.   Specialty:  Orthopedic Surgery Contact information: [address] [place] [address] [phone]           Signed: Arlee Muslim, PA-C Orthopaedic Surgery 11/04/2016, 8:05 AM

## 2016-11-03 NOTE — Care Management Note (Signed)
Case Management Note  Patient Details  Name: Brittany Stark MRN: [id number] Date of Birth: October 29, 1945  Subjective/Objective:                  RIGHT TOTAL KNEE ARTHROPLASTY (Right) Action/Plan: Discharge planning Expected Discharge Date:                  Expected Discharge Plan:  Home/Self Care  In-House Referral:  NA  Discharge planning Services  CM Consult  Post Acute Care Choice:  NA Choice offered to:  Patient  DME Arranged:  N/A DME Agency:  NA  HH Arranged:  NA HH Agency:  NA  Status of Service:  Completed, signed off  If discussed at [place] of Stay Meetings, dates discussed:    Additional Comments: CM met with pt in room to confirm plan is for outpt PT; pt confirms. Pt states she has all DME needed at home. NO other CM needs were communicated. Dellie Catholic, RN 11/03/2016, 12:03 PM

## 2016-11-03 NOTE — Discharge Instructions (Addendum)
° °Dr. Frank Aluisio °Total Joint Specialist °[facility] °[address] °[place], [place] [address] °[phone] ° °TOTAL KNEE REPLACEMENT POSTOPERATIVE DIRECTIONS ° °Knee Rehabilitation, Guidelines Following Surgery  °Results after knee surgery are often greatly improved when you follow the exercise, range of motion and muscle strengthening exercises prescribed by your doctor. Safety measures are also important to protect the knee from further injury. Any time any of these exercises cause you to have increased pain or swelling in your knee joint, decrease the amount until you are comfortable again and slowly increase them. If you have problems or questions, call your caregiver or physical therapist for advice.  ° °HOME CARE INSTRUCTIONS  °Remove items at home which could result in a fall. This includes throw rugs or furniture in walking pathways.  °· ICE to the affected knee every three hours for 30 minutes at a time and then as needed for pain and swelling.  Continue to use ice on the knee for pain and swelling from surgery. You may notice swelling that will progress down to the foot and ankle.  This is normal after surgery.  Elevate the leg when you are not up walking on it.   °· Continue to use the breathing machine which will help keep your temperature down.  It is common for your temperature to cycle up and down following surgery, especially at night when you are not up moving around and exerting yourself.  The breathing machine keeps your lungs expanded and your temperature down. °· Do not place pillow under knee, focus on keeping the knee straight while resting ° °DIET °You may resume your previous home diet once your are discharged from the hospital. ° °DRESSING / WOUND CARE / SHOWERING °You may shower 3 days after surgery, but keep the wounds dry during showering.  You may use an occlusive plastic wrap (Press'n Seal for example), NO SOAKING/SUBMERGING IN THE BATHTUB.  If the  bandage gets wet, change with a clean dry gauze.  If the incision gets wet, pat the wound dry with a clean towel. °You may start showering once you are discharged home but do not submerge the incision under water. Just pat the incision dry and apply a dry gauze dressing on daily. °Change the surgical dressing daily and reapply a dry dressing each time. ° °ACTIVITY °Walk with your walker as instructed. °Use walker as long as suggested by your caregivers. °Avoid periods of inactivity such as sitting longer than an hour when not asleep. This helps prevent blood clots.  °You may resume a sexual relationship in one month or when given the OK by your doctor.  °You may return to work once you are cleared by your doctor.  °Do not drive a car for 6 weeks or until released by you surgeon.  °Do not drive while taking narcotics. ° °WEIGHT BEARING °Weight bearing as tolerated with assist device (walker, cane, etc) as directed, use it as long as suggested by your surgeon or therapist, typically at least 4-6 weeks. ° °POSTOPERATIVE CONSTIPATION PROTOCOL °Constipation - defined medically as fewer than three stools per week and severe constipation as less than one stool per week. ° °One of the most common issues patients have following surgery is constipation.  Even if you have a regular bowel pattern at home, your normal regimen is likely to be disrupted due to multiple reasons following surgery.  Combination of anesthesia, postoperative narcotics, change in appetite and fluid intake all can affect your bowels.    In order to avoid complications following surgery, here are some recommendations in order to help you during your recovery period. ° °Colace (docusate) - Pick up an over-the-counter form of Colace or another stool softener and take twice a day as long as you are requiring postoperative pain medications.  Take with a full glass of water daily.  If you experience loose stools or diarrhea, hold the colace until you stool forms  back up.  If your symptoms do not get better within 1 week or if they get worse, check with your doctor. ° °Dulcolax (bisacodyl) - Pick up over-the-counter and take as directed by the product packaging as needed to assist with the movement of your bowels.  Take with a full glass of water.  Use this product as needed if not relieved by Colace only.  ° °MiraLax (polyethylene glycol) - Pick up over-the-counter to have on hand.  MiraLax is a solution that will increase the amount of water in your bowels to assist with bowel movements.  Take as directed and can mix with a glass of water, juice, soda, coffee, or tea.  Take if you go more than two days without a movement. °Do not use MiraLax more than once per day. Call your doctor if you are still constipated or irregular after using this medication for 7 days in a row. ° °If you continue to have problems with postoperative constipation, please contact the office for further assistance and recommendations.  If you experience "the worst abdominal pain ever" or develop nausea or vomiting, please contact the office immediatly for further recommendations for treatment. ° °ITCHING ° If you experience itching with your medications, try taking only a single pain pill, or even half a pain pill at a time.  You can also use Benadryl over the counter for itching or also to help with sleep.  ° °TED HOSE STOCKINGS °Wear the elastic stockings on both legs for three weeks following surgery during the day but you may remove then at night for sleeping. ° °MEDICATIONS °See your medication summary on the “After Visit Summary” that the nursing staff will review with you prior to discharge.  You may have some home medications which will be placed on hold until you complete the course of blood thinner medication.  It is important for you to complete the blood thinner medication as prescribed by your surgeon.  Continue your approved medications as instructed at time of  discharge. ° °PRECAUTIONS °If you experience chest pain or shortness of breath - call 911 immediately for transfer to the hospital emergency department.  °If you develop a fever greater that 101 F, purulent drainage from wound, increased redness or drainage from wound, foul odor from the wound/dressing, or calf pain - CONTACT YOUR SURGEON.   °                                                °FOLLOW-UP APPOINTMENTS °Make sure you keep all of your appointments after your operation with your surgeon and caregivers. You should call the office at the above phone number and make an appointment for approximately two weeks after the date of your surgery or on the date instructed by your surgeon outlined in the "After Visit Summary". ° ° °RANGE OF MOTION AND STRENGTHENING EXERCISES  °Rehabilitation of the knee is important following a knee injury or   an operation. After just a few days of immobilization, the muscles of the thigh which control the knee become weakened and shrink (atrophy). Knee exercises are designed to build up the tone and strength of the thigh muscles and to improve knee motion. Often times heat used for twenty to thirty minutes before working out will loosen up your tissues and help with improving the range of motion but do not use heat for the first two weeks following surgery. These exercises can be done on a training (exercise) mat, on the floor, on a table or on a bed. Use what ever works the best and is most comfortable for you Knee exercises include:  °Leg Lifts - While your knee is still immobilized in a splint or cast, you can do straight leg raises. Lift the leg to 60 degrees, hold for 3 sec, and slowly lower the leg. Repeat 10-20 times 2-3 times daily. Perform this exercise against resistance later as your knee gets better.  °Quad and Hamstring Sets - Tighten up the muscle on the front of the thigh (Quad) and hold for 5-10 sec. Repeat this 10-20 times hourly. Hamstring sets are done by pushing the  foot backward against an object and holding for 5-10 sec. Repeat as with quad sets.  °· Leg Slides: Lying on your back, slowly slide your foot toward your buttocks, bending your knee up off the floor (only go as far as is comfortable). Then slowly slide your foot back down until your leg is flat on the floor again. °· Angel Wings: Lying on your back spread your legs to the side as far apart as you can without causing discomfort.  °A rehabilitation program following serious knee injuries can speed recovery and prevent re-injury in the future due to weakened muscles. Contact your doctor or a physical therapist for more information on knee rehabilitation.  ° °IF YOU ARE TRANSFERRED TO A SKILLED REHAB FACILITY °If the patient is transferred to a skilled rehab facility following release from the hospital, a list of the current medications will be sent to the facility for the patient to continue.  When discharged from the skilled rehab facility, please have the facility set up the patient's Home Health Physical Therapy prior to being released. Also, the skilled facility will be responsible for providing the patient with their medications at time of release from the facility to include their pain medication, the muscle relaxants, and their blood thinner medication. If the patient is still at the rehab facility at time of the two week follow up appointment, the skilled rehab facility will also need to assist the patient in arranging follow up appointment in our office and any transportation needs. ° °MAKE SURE YOU:  °Understand these instructions.  °Get help right away if you are not doing well or get worse.  ° ° °Pick up stool softner and laxative for home use following surgery while on pain medications. °Do not submerge incision under water. °Please use good hand washing techniques while changing dressing each day. °May shower starting three days after surgery. °Please use a clean towel to pat the incision dry following  showers. °Continue to use ice for pain and swelling after surgery. °Do not use any lotions or creams on the incision until instructed by your surgeon. ° °Take Xarelto for two and a half more weeks following discharge from the hospital, then discontinue Xarelto. °Once the patient has completed the blood thinner regimen, then take a Baby 81 mg Aspirin daily for three   more weeks. ° ° ° °Information on my medicine - XARELTO® (Rivaroxaban) ° °This medication education was reviewed with me or my healthcare representative as part of my discharge preparation.  The pharmacist that spoke with me during my hospital stay was:  Legge, Justin Marshall, RPH ° °Why was Xarelto® prescribed for you? °Xarelto® was prescribed for you to reduce the risk of blood clots forming after orthopedic surgery. The medical term for these abnormal blood clots is venous thromboembolism (VTE). ° °What do you need to know about xarelto® ? °Take your Xarelto® ONCE DAILY at the same time every day. °You may take it either with or without food. ° °If you have difficulty swallowing the tablet whole, you may crush it and mix in applesauce just prior to taking your dose. ° °Take Xarelto® exactly as prescribed by your doctor and DO NOT stop taking Xarelto® without talking to the doctor who prescribed the medication.  Stopping without other VTE prevention medication to take the place of Xarelto® may increase your risk of developing a clot. ° °After discharge, you should have regular check-up appointments with your healthcare provider that is prescribing your Xarelto®.   ° °What do you do if you miss a dose? °If you miss a dose, take it as soon as you remember on the same day then continue your regularly scheduled once daily regimen the next day. Do not take two doses of Xarelto® on the same day.  ° °Important Safety Information °A possible side effect of Xarelto® is bleeding. You should call your healthcare provider right away if you experience any of the  following: °? Bleeding from an injury or your nose that does not stop. °? Unusual colored urine (red or dark brown) or unusual colored stools (red or black). °? Unusual bruising for unknown reasons. °? A serious fall or if you hit your head (even if there is no bleeding). ° °Some medicines may interact with Xarelto® and might increase your risk of bleeding while on Xarelto®. To help avoid this, consult your healthcare provider or pharmacist prior to using any new prescription or non-prescription medications, including herbals, vitamins, non-steroidal anti-inflammatory drugs (NSAIDs) and supplements. ° °This website has more information on Xarelto®: www.xarelto.com. ° ° ° °

## 2016-11-03 NOTE — Evaluation (Addendum)
Occupational Therapy Evaluation Patient Details Name: BRUCE MARTINDALE MRN: [id number] DOB: 04-27-1946 Today's Date: 11/03/2016    History of Present Illness s/p R TKA   Clinical Impression   This 71 year old female was admitted for the above sx.  Will see pt for one more session to further educate on tub bench.       Follow Up Recommendations  No OT follow up;Supervision/Assistance - 24 hour    Equipment Recommendations  Tub/shower bench    Recommendations for Other Services       Precautions / Restrictions Precautions Precautions: Knee;Fall Precaution Comments: did SLR; KI used first session Restrictions Weight Bearing Restrictions: No      Mobility Bed Mobility Overal bed mobility: Needs Assistance             General bed mobility comments: supervision, cues for supine to sit sequence  Transfers Overall transfer level: Needs assistance Equipment used: Rolling walker (2 wheeled) Transfers: Sit to/from Stand Sit to Stand: Min guard         General transfer comment: for safety. Cues for UE and LE placement    Balance                                            ADL Overall ADL's : Needs assistance/impaired     Grooming: Set up;Wash/dry hands;Sitting   Upper Body Bathing: Set up;Sitting   Lower Body Bathing: Minimal assistance;Sit to/from stand   Upper Body Dressing : Set up;Sitting   Lower Body Dressing: Moderate assistance;Sit to/from stand   Toilet Transfer: Minimal assistance;Ambulation;BSC;RW   Toileting- Water quality scientist and Hygiene: Min guard;Sit to/from stand         General ADL Comments: ambulated to bathroom.  Pt will have 24/7 assistance from boyfriend at home.  She is interested in tub bench to go over her tub; discussed this option vs sponge bathing and tub readiness.  Reviewed precautions     Vision         Perception     Praxis      Pertinent Vitals/Pain Pain Assessment: 0-10 Pain Score: 4  Pain  Location: R knee Pain Descriptors / Indicators: Aching Pain Intervention(s): Limited activity within patient's tolerance;Monitored during session;Premedicated before session;Repositioned;Ice applied     Hand Dominance     Extremity/Trunk Assessment Upper Extremity Assessment Upper Extremity Assessment: Overall WFL for tasks assessed           Communication Communication Communication: Prefers language other than English   Cognition Arousal/Alertness: Awake/alert Behavior During Therapy: WFL for tasks assessed/performed Overall Cognitive Status: Within Functional Limits for tasks assessed                     General Comments       Exercises       Shoulder Instructions      Home Living Family/patient expects to be discharged to:: Private residence Living Arrangements: Spouse/significant other Available Help at Discharge: Family               Bathroom Shower/Tub: Tub/shower unit Shower/tub characteristics: Curtain Biochemist, clinical: Standard     Home Equipment: Environmental consultant - 2 wheels;Bedside commode   Additional Comments: pt bought DME prior to coming to hospital.  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        OT Problem List:        OT Treatment/Interventions: Self-care/ADL training;DME and/or AE instruction;Patient/family education    OT Goals(Current goals can be found in the care plan section) Acute Rehab OT Goals Patient Stated Goal: home OT Goal Formulation: With patient Time For Goal Achievement: 11/10/16 Potential to Achieve Goals: Good ADL Goals Pt Will Transfer to Toilet: with supervision;ambulating;bedside commode Pt Will Perform Tub/Shower Transfer: Tub transfer;with min assist;tub bench  OT Frequency: Min 2X/week   Barriers to D/C:            Co-evaluation              End of Session    Activity Tolerance: Patient tolerated  treatment well Patient left: in chair;with call bell/phone within reach;with chair alarm set  OT Visit Diagnosis: Pain Pain - Right/Left: Right Pain - part of body: Knee                ADL either performed or assessed with clinical judgement  Time: [id number] OT Time Calculation (min): 21 min Charges:  OT General Charges $OT Visit: 1 Procedure OT Evaluation $OT Eval Low Complexity: 1 Procedure G-Codes:     [place], OTR/L [id number] 11/03/2016  Hanna Aultman 11/03/2016, 11:28 AM

## 2016-11-03 NOTE — Evaluation (Signed)
Physical Therapy Evaluation Patient Details Name: MIREYLI VERDINE MRN: [id number] DOB: 1946-05-09 Today's Date: 11/03/2016   History of Present Illness  71 yo female s/p R TKA 11/02/16.   Clinical Impression  On eval, pt was Min guard assist for mobility. She walked ~60 feet with a RW. Pain rated 8/10 with exercises, ambulation. She is very motivated to be as independent as possible. Will follow and progress activity as tolerated.     Follow Up Recommendations Outpatient PT    Equipment Recommendations  None recommended by PT    Recommendations for Other Services       Precautions / Restrictions Precautions Precautions: Fall;Knee Precaution Comments: pt able to SLR-KI not used Required Braces or Orthoses: Knee Immobilizer - Right Knee Immobilizer - Right: Discontinue once straight leg raise with < 10 degree lag Restrictions Weight Bearing Restrictions: No RLE Weight Bearing: Weight bearing as tolerated      Mobility  Bed Mobility Overal bed mobility: Needs Assistance Bed Mobility: Sit to Supine       Sit to supine: Supervision   General bed mobility comments: supervision, cues for supine to sit sequence  Transfers Overall transfer level: Needs assistance Equipment used: Rolling walker (2 wheeled) Transfers: Sit to/from Stand Sit to Stand: Min guard         General transfer comment: Multiple attempts and increased time from low surface. VCs safety, technique, hand/LE placement.   Ambulation/Gait Ambulation/Gait assistance: Min guard Ambulation Distance (Feet): 60 Feet Assistive device: Rolling walker (2 wheeled) Gait Pattern/deviations: Step-to pattern     General Gait Details: Cues for safety, posture, distance from RW. slow but steady gait speed.   Stairs            Wheelchair Mobility    Modified Rankin (Stroke Patients Only)       Balance                                             Pertinent Vitals/Pain Pain  Assessment: 0-10 Pain Score: 8  Pain Location: R knee with exercises, ambulation Pain Descriptors / Indicators: Sore;Aching Pain Intervention(s): Monitored during session;Repositioned;Ice applied    Home Living Family/patient expects to be discharged to:: Private residence Living Arrangements: Spouse/significant other Available Help at Discharge: Family Type of Home: House Home Access: Level entry     Home Layout: One [place]: Environmental consultant - 2 wheels;Bedside commode Additional Comments: pt bought DME prior to coming to hospital.  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     Hand Dominance        Extremity/Trunk Assessment   Upper Extremity Assessment Upper Extremity Assessment: Defer to OT evaluation    Lower Extremity Assessment Lower Extremity Assessment: Generalized weakness (s/p R TKA)    Cervical / Trunk Assessment Cervical / Trunk Assessment: Normal  Communication   Communication: No difficulties  Cognition Arousal/Alertness: Awake/alert Behavior During Therapy: WFL for tasks assessed/performed Overall Cognitive Status: Within Functional Limits for tasks assessed                      General Comments      Exercises Total Joint Exercises Ankle Circles/Pumps: AROM;Both;10 reps;Supine Quad Sets: AROM;Both;10 reps;Supine Hip ABduction/ADduction: AROM;Right;10 reps;Supine Straight Leg Raises: AROM;Right;10 reps;Supine Knee  Flexion: AROM;Right;10 reps;Seated Goniometric ROM: ~5-85 degrees   Assessment/Plan    PT Assessment Patient needs continued PT services  PT Problem List Decreased strength;Decreased mobility;Decreased range of motion;Decreased activity tolerance;Decreased balance;Decreased knowledge of use of DME;Pain       PT Treatment Interventions DME instruction;Therapeutic activities;Gait training;Therapeutic exercise;Patient/family education;Functional  mobility training;Balance training    PT Goals (Current goals can be found in the Care Plan section)  Acute Rehab PT Goals Patient Stated Goal: regain independence and PLOF PT Goal Formulation: With patient Time For Goal Achievement: 11/17/16 Potential to Achieve Goals: Good    Frequency 7X/week   Barriers to discharge        Co-evaluation               End of Session   Activity Tolerance: Patient tolerated treatment well Patient left: in bed;with call bell/phone within reach   PT Visit Diagnosis: Difficulty in walking, not elsewhere classified (R26.2)         Time: [id number] PT Time Calculation (min) (ACUTE ONLY): 23 min   Charges:   PT Evaluation $PT Eval Low Complexity: 1 Procedure PT Treatments $Therapeutic Exercise: 8-22 mins   PT G Codes:         Weston Anna, MPT Pager: [phone]

## 2016-11-03 NOTE — Progress Notes (Signed)
   Subjective: 1 Day Post-Op Procedure(s) (LRB): RIGHT TOTAL KNEE ARTHROPLASTY (Right) Patient reports pain as mild.   Patient seen in rounds for Dr. Wynelle Link. Patient is well, but has had some minor complaints of pain in the knee, requiring pain medications We will start therapy today.  Plan is to go Home after hospital stay.  Objective: Vital signs in last 24 hours: Temp:  [96.5 F (35.8 C)-98.1 F (36.7 C)] 97.5 F (36.4 C) (02/20 0603) Pulse Rate:  [56-69] 66 (02/20 0603) Resp:  [6-20] 16 (02/20 0603) BP: (104-159)/(66-84) 124/75 (02/20 0603) SpO2:  [96 %-100 %] 98 % (02/20 0603)  Intake/Output from previous day:  Intake/Output Summary (Last 24 hours) at 11/03/16 0833 Last data filed at 11/03/16 0800  Gross per 24 hour  Intake          4946.25 ml  Output             3943 ml  Net          1003.25 ml    Intake/Output this shift: Total I/O In: 240 [P.O.:240] Out: 300 [Urine:300]  Labs:  Recent Labs  11/03/16 0432  HGB 9.8*    Recent Labs  11/03/16 0432  WBC 8.0  RBC 3.34*  HCT 30.7*  PLT 235    Recent Labs  11/03/16 0432  NA 138  K 3.6  CL 105  CO2 27  BUN 9  CREATININE 0.68  GLUCOSE 125*  CALCIUM 8.7*   No results for input(s): LABPT, INR in the last 72 hours.  EXAM General - Patient is Alert, Appropriate and Oriented Extremity - Neurovascular intact Sensation intact distally Intact pulses distally Dorsiflexion/Plantar flexion intact Dressing - dressing C/D/I Motor Function - intact, moving foot and toes well on exam.  Hemovac pulled without difficulty.  Past Medical History:  Diagnosis Date  . Arthritis    rheumatoid and osteoarthritis   . Depression   . Elevated cholesterol    currently under control  . GERD (gastroesophageal reflux disease)    laryngopharyngeal reflux  . Hemorrhoids   . Hypertension     Assessment/Plan: 1 Day Post-Op Procedure(s) (LRB): RIGHT TOTAL KNEE ARTHROPLASTY (Right) Principal Problem:   OA  (osteoarthritis) of knee  Estimated body mass index is 27.89 kg/m as calculated from the following:   Height as of this encounter: 5\' 7"  (1.702 m).   Weight as of this encounter: 80.8 kg (178 lb 1.6 oz). Up with therapy Discharge home - no home health - straight to outpatient on 2/23  DVT Prophylaxis - Xarelto Weight-Bearing as tolerated to right leg D/C O2 and Pulse OX and try on Room Air  Arlee Muslim, PA-C Orthopaedic Surgery 11/03/2016, 8:33 AM

## 2016-11-04 LAB — BASIC METABOLIC PANEL
Anion gap: 5 (ref 5–15)
BUN: 11 mg/dL (ref 6–20)
CALCIUM: 8.7 mg/dL — AB (ref 8.9–10.3)
CHLORIDE: 108 mmol/L (ref 101–111)
CO2: 29 mmol/L (ref 22–32)
CREATININE: 0.7 mg/dL (ref 0.44–1.00)
GFR calc Af Amer: >60 mL/min (ref >60)
GFR calc non Af Amer: >60 mL/min (ref >60)
GLUCOSE: 125 mg/dL — AB (ref 65–99)
Potassium: 3.6 mmol/L (ref 3.5–5.1)
Sodium: 142 mmol/L (ref 135–145)

## 2016-11-04 LAB — CBC
HEMATOCRIT: 27.5 % — AB (ref 36.0–46.0)
HEMOGLOBIN: 8.8 g/dL — AB (ref 12.0–15.0)
MCH: 28.9 pg (ref 26.0–34.0)
MCHC: 32 g/dL (ref 30.0–36.0)
MCV: 90.5 fL (ref 78.0–100.0)
Platelets: 237 10*3/uL (ref 150–400)
RBC: 3.04 MIL/uL — ABNORMAL LOW (ref 3.87–5.11)
RDW: 15.1 % (ref 11.5–15.5)
WBC: 9.3 10*3/uL (ref 4.0–10.5)

## 2016-11-04 MED ORDER — POLYSACCHARIDE IRON COMPLEX 150 MG PO CAPS
150.0000 mg | ORAL_CAPSULE | Freq: Two times a day (BID) | ORAL | Status: DC
  Administered 2016-11-04: 150 mg via ORAL
  Filled 2016-11-04: qty 1

## 2016-11-04 MED ORDER — POLYSACCHARIDE IRON COMPLEX 150 MG PO CAPS: 150.0000 mg | ORAL_CAPSULE | Freq: Two times a day (BID) | ORAL | 0 refills | Status: DC

## 2016-11-04 NOTE — Progress Notes (Signed)
Physical Therapy Treatment Patient Details Name: Brittany Stark MRN: [id number] DOB: Jul 01, 1946 Today's Date: 11/04/2016    History of Present Illness 71 yo female s/p R TKA 11/02/16.     PT Comments    POD # 2 am session Pt c/o increased pain > yesterday.  Applied KI and instructed on use for amb.  Assisted with amb a greater distance in hallway.  Returned to bed to perform TE's however unable to complete all due to pain and fatigue. Applied ICE. Pt will need another PT session prior to D/C.   Follow Up Recommendations  Outpatient PT     Equipment Recommendations  None recommended by PT    Recommendations for Other Services       Precautions / Restrictions Precautions Precautions: Fall;Knee Precaution Comments: applied KI and instructed on use for increased support during amb Required Braces or Orthoses: Knee Immobilizer - Right Knee Immobilizer - Right: Discontinue once straight leg raise with < 10 degree lag Restrictions Weight Bearing Restrictions: No RLE Weight Bearing: Weight bearing as tolerated    Mobility  Bed Mobility Overal bed mobility: Needs Assistance Bed Mobility: Supine to Sit     Supine to sit: Supervision;Min guard     General bed mobility comments: increased, increased time as pt declined assist with R LE "let me do for myself"  Transfers Overall transfer level: Needs assistance Equipment used: Rolling walker (2 wheeled) Transfers: Sit to/from [facility] Sit to Stand: Supervision Stand pivot transfers: Supervision       General transfer comment: increased time and 25% VC's to extend R LE prior to sit and prior to stand  Ambulation/Gait Ambulation/Gait assistance: Supervision;Min guard Ambulation Distance (Feet): 82 Feet Assistive device: Rolling walker (2 wheeled) Gait Pattern/deviations: Step-to pattern;Decreased stance time - right Gait velocity: decreased   General Gait Details: Cues for safety, posture, distance  from RW. slow but steady gait speed.    Stairs            Wheelchair Mobility    Modified Rankin (Stroke Patients Only)       Balance                                    Cognition Arousal/Alertness: Awake/alert Behavior During Therapy: WFL for tasks assessed/performed Overall Cognitive Status: Within Functional Limits for tasks assessed                      Exercises   Total Knee Replacement TE's 10 reps B LE ankle pumps 10 reps towel squeezes 10 reps knee presses 10 reps heel slides AAROM Followed by ICE     General Comments        Pertinent Vitals/Pain Pain Assessment: 0-10 Pain Score: 7  Pain Location: R knee Pain Descriptors / Indicators: Sore;Operative site guarding;Grimacing;Tender Pain Intervention(s): Monitored during session;Repositioned;Ice applied    Home Living                      Prior Function            PT Goals (current goals can now be found in the care plan section) Progress towards PT goals: Progressing toward goals    Frequency    7X/week      PT Plan Current plan remains appropriate    Co-evaluation             End of Session  Equipment Utilized During Treatment: Gait belt Activity Tolerance: Patient tolerated treatment well Patient left: in bed;with call bell/phone within reach;with bed alarm set Nurse Communication:  (pt will need another PT session before D/C) PT Visit Diagnosis: Difficulty in walking, not elsewhere classified (R26.2)     Time: 1110-1150 PT Time Calculation (min) (ACUTE ONLY): 40 min  Charges:  $Gait Training: 8-22 mins $Therapeutic Exercise: 8-22 mins $Therapeutic Activity: 8-22 mins                    G Codes:       Rica Koyanagi  PTA [facility]  Acute  Rehab Pager      [phone]

## 2016-11-04 NOTE — Progress Notes (Signed)
Physical Therapy Treatment Patient Details Name: Brittany Stark MRN: [id number] DOB: 18-Jun-1946 Today's Date: 11/04/2016    History of Present Illness 71 yo female s/p R TKA 11/02/16.     PT Comments    POD # 2 pm session Boy friend present for "hands on" education listed below.   Pt ready for D/C to home  Follow Up Recommendations  Outpatient PT     Equipment Recommendations  None recommended by PT    Recommendations for Other Services       Precautions / Restrictions Precautions Precautions: Fall;Knee Precaution Comments: applied KI and instructed on use for increased support during amb Required Braces or Orthoses: Knee Immobilizer - Right Knee Immobilizer - Right: Discontinue once straight leg raise with < 10 degree lag Restrictions Weight Bearing Restrictions: No RLE Weight Bearing: Weight bearing as tolerated    Mobility  Bed Mobility Overal bed mobility: Needs Assistance Bed Mobility: Supine to Sit;Sit to Supine     Supine to sit: Supervision;Min guard     General bed mobility comments: had Boyfriend "hands on" assist pt OOB and back tio bed with instruction on safe handling  Transfers Overall transfer level: Needs assistance Equipment used: Rolling walker (2 wheeled) Transfers: Sit to/from [facility] Sit to Stand: Supervision Stand pivot transfers: Supervision       General transfer comment: had Boyfriend "hands on" assist pt with transfers including toileting with instruction on safety.   Ambulation/Gait Ambulation/Gait assistance: Supervision;Min guard Ambulation Distance (Feet): 60 Feet Assistive device: Rolling walker (2 wheeled) Gait Pattern/deviations: Step-to pattern;Decreased stance time - right Gait velocity: decreased   General Gait Details: had Boyfriend "hands on" assist pt was safe amb and instructed both on Ki use and proper application.    Stairs Stairs: Yes   Stair Management: No rails;Step to  pattern;Forwards;With walker Number of Stairs: 1 General stair comments: one "curb" step with Boyfriend and instruction on proper walker placement and LE sequencing.  Difficulty performing as "other knee is bad"  Wheelchair Mobility    Modified Rankin (Stroke Patients Only)       Balance                                    Cognition Arousal/Alertness: Awake/alert Behavior During Therapy: WFL for tasks assessed/performed Overall Cognitive Status: Within Functional Limits for tasks assessed                      Exercises      General Comments        Pertinent Vitals/Pain Pain Assessment: 0-10 Pain Score: 7  Pain Location: R knee Pain Descriptors / Indicators: Sore;Operative site guarding;Grimacing;Tender Pain Intervention(s): Monitored during session;Repositioned;Ice applied    Home Living                      Prior Function            PT Goals (current goals can now be found in the care plan section) Progress towards PT goals: Progressing toward goals    Frequency    7X/week      PT Plan Current plan remains appropriate    Co-evaluation             End of Session Equipment Utilized During Treatment: Gait belt Activity Tolerance: Patient tolerated treatment well Patient left: in bed;with call bell/phone within reach;with bed alarm set Nurse Communication:  (  pt will need another PT session before D/C) PT Visit Diagnosis: Difficulty in walking, not elsewhere classified (R26.2)     Time: [id number] PT Time Calculation (min) (ACUTE ONLY): 28 min  Charges:  $Gait Training: 8-22 mins $Therapeutic Activity: 8-22 mins                    G Codes:       Rica Koyanagi  PTA [facility]  Acute  Rehab Pager      [phone]

## 2016-11-04 NOTE — Progress Notes (Signed)
Occupational Therapy Treatment Patient Details Name: Brittany Stark MRN: [id number] DOB: Sep 11, 1946 Today's Date: 11/04/2016    History of present illness 71 yo female s/p R TKA 11/02/16.    OT comments  All education was completed this session  Follow Up Recommendations  No OT follow up;Supervision/Assistance - 24 hour    Equipment Recommendations  None recommended by OT (pt will get tub bench herself)    Recommendations for Other Services      Precautions / Restrictions Precautions Precautions: Fall;Knee Precaution Comments: pt requested KI; stated easier for bed mobility Required Braces or Orthoses: Knee Immobilizer - Right Restrictions RLE Weight Bearing: Weight bearing as tolerated       Mobility Bed Mobility         Supine to sit: Supervision Sit to supine: Supervision      Transfers Overall transfer level: Modified independent Equipment used: Rolling walker (2 wheeled)             General transfer comment: no cues needed    Balance                                   ADL                           Toilet Transfer: Supervision;Ambulation;BSC   Toileting- Clothing Manipulation and Hygiene: Modified independent;Sit to/from stand         General ADL Comments: ambulated to bathroom and performed above transfers.  Brought tub bench to show pt and reviewed transfer.  Pt did not wish to practice. Verbalizes understanding. She said that she will have someone pick this up for her      Vision                     Perception     Praxis      Cognition   Behavior During Therapy: [facility] for tasks assessed/performed Overall Cognitive Status: Within Functional Limits for tasks assessed                         Exercises     Shoulder Instructions       General Comments      Pertinent Vitals/ Pain       Pain Score: 4  Pain Location: R knee Pain Descriptors / Indicators: Sore Pain Intervention(s): Limited  activity within patient's tolerance;Monitored during session;Premedicated before session;Repositioned  Home Living                                          Prior Functioning/Environment              Frequency           Progress Toward Goals  OT Goals(current goals can now be found in the care plan section)  Progress towards OT goals: Progressing toward goals (no further OT is needed)     Plan      Co-evaluation                 End of Session        Activity Tolerance Patient tolerated treatment well   Patient Left in bed;with call bell/phone within reach   Nurse Communication  Time: [id number] OT Time Calculation (min): 23 min  Charges: OT General Charges $OT Visit: 1 Procedure OT Treatments $Self Care/Home Management : 8-22 mins  Lesle Chris, OTR/L [id number] 11/04/2016   Brittany Stark 11/04/2016, 10:08 AM

## 2016-11-04 NOTE — Progress Notes (Signed)
   Subjective: 2 Days Post-Op Procedure(s) (LRB): RIGHT TOTAL KNEE ARTHROPLASTY (Right) Patient reports pain as mild.   Patient seen in rounds with Dr. Wynelle Link. Patient is well, but has had some minor complaints of pain in the knee, requiring pain medications Patient is ready to go home  Objective: Vital signs in last 24 hours: Temp:  [97.5 F (36.4 C)-98.4 F (36.9 C)] 98.1 F (36.7 C) (02/21 0557) Pulse Rate:  [66-79] 79 (02/21 0557) Resp:  [16] 16 (02/21 0557) BP: (139-147)/(63-71) 142/70 (02/21 0557) SpO2:  [95 %-100 %] 100 % (02/21 0557)  Intake/Output from previous day:  Intake/Output Summary (Last 24 hours) at 11/04/16 0802 Last data filed at 11/04/16 0724  Gross per 24 hour  Intake           1302.5 ml  Output             2000 ml  Net           -697.5 ml    Intake/Output this shift: Total I/O In: -  Out: 200 [Urine:200]  Labs:  Recent Labs  11/03/16 0432 11/04/16 0512  HGB 9.8* 8.8*    Recent Labs  11/03/16 0432 11/04/16 0512  WBC 8.0 9.3  RBC 3.34* 3.04*  HCT 30.7* 27.5*  PLT 235 237    Recent Labs  11/03/16 0432 11/04/16 0512  NA 138 142  K 3.6 3.6  CL 105 108  CO2 27 29  BUN 9 11  CREATININE 0.68 0.70  GLUCOSE 125* 125*  CALCIUM 8.7* 8.7*   No results for input(s): LABPT, INR in the last 72 hours.  EXAM: General - Patient is Alert and Oriented Extremity - Neurovascular intact Sensation intact distally Dorsiflexion/Plantar flexion intact Incision - clean, no drainage Motor Function - intact, moving foot and toes well on exam.   Assessment/Plan: 2 Days Post-Op Procedure(s) (LRB): RIGHT TOTAL KNEE ARTHROPLASTY (Right) Procedure(s) (LRB): RIGHT TOTAL KNEE ARTHROPLASTY (Right) Past Medical History:  Diagnosis Date  . Arthritis    rheumatoid and osteoarthritis   . Depression   . Elevated cholesterol    currently under control  . GERD (gastroesophageal reflux disease)    laryngopharyngeal reflux  . Hemorrhoids   .  Hypertension    Principal Problem:   OA (osteoarthritis) of knee  Estimated body mass index is 27.89 kg/m as calculated from the following:   Height as of this encounter: 5\' 7"  (1.702 m).   Weight as of this encounter: 80.8 kg (178 lb 1.6 oz). Up with therapy Diet - Cardiac diet Follow up - in 2 weeks Activity - WBAT Disposition - Home Condition Upon Discharge - Good D/C Meds - See DC Summary DVT Prophylaxis - [place], PA-C Orthopaedic Surgery 11/04/2016, 8:02 AM

## 2016-11-06 ENCOUNTER — Other Ambulatory Visit: Payer: Self-pay | Admitting: *Deleted

## 2016-11-06 DIAGNOSIS — M25661 Stiffness of right knee, not elsewhere classified: Secondary | ICD-10-CM | POA: Diagnosis not present

## 2016-11-06 NOTE — Patient Outreach (Signed)
[place] [facility]) Care Management  11/06/2016  DAYLEN SHERK 1945-12-27 [id number]  Referral via [place]; discharged from inpatient admission  from [facility] 11/04/2016.  Telephone call to patient who was advised of reason for call & [facility] care management services. HIPPA verification received from patient.  Patient states she is just returning from outpatient physical therapy and is currently having pain so can not currently talk but will take call later.  States someone is with her now.   Plan:  Will follow up.  Sherrin Daisy, RN BSN [place] Management Coordinator [facility] Care Management  [phone]

## 2016-11-09 ENCOUNTER — Other Ambulatory Visit: Payer: Self-pay | Admitting: *Deleted

## 2016-11-09 DIAGNOSIS — M25661 Stiffness of right knee, not elsewhere classified: Secondary | ICD-10-CM | POA: Diagnosis not present

## 2016-11-09 NOTE — Patient Outreach (Signed)
[place] [facility]) Care Management  11/09/2016  Brittany Stark May 09, 1946 [id number]  Telephone call attempt; left message requesting call back.  Plan: Will follow up.  Sherrin Daisy, RN BSN [place] Management Coordinator [facility] Care Management  [phone]

## 2016-11-10 ENCOUNTER — Other Ambulatory Visit: Payer: Self-pay | Admitting: *Deleted

## 2016-11-10 NOTE — Patient Outreach (Signed)
[place] [facility]) Care Management  11/10/2016  GENESI BUFFIN 08/31/1946 [id number]  Telephone call to patient; left message on voice mail requesting call back.  Plan: Will follow up.  Sherrin Daisy, RN BSN [place] Management Coordinator [facility] Care Management  [phone]

## 2016-11-11 ENCOUNTER — Other Ambulatory Visit: Payer: Self-pay | Admitting: *Deleted

## 2016-11-11 ENCOUNTER — Encounter: Payer: Self-pay | Admitting: *Deleted

## 2016-11-11 ENCOUNTER — Other Ambulatory Visit: Payer: Self-pay | Admitting: Internal Medicine

## 2016-11-11 DIAGNOSIS — M25661 Stiffness of right knee, not elsewhere classified: Secondary | ICD-10-CM | POA: Diagnosis not present

## 2016-11-11 NOTE — Patient Outreach (Signed)
[place] [facility]) Care Management  11/11/2016  Brittany Stark 1946-03-23 [id number]  Telephone call to patient who answered call, but call was disconnected after patient was advised of reason for call . Return call to patient and message left on voice mail requesting return call.  Plan: Will send outreach letter. Follow up in 10 business days.  Sherrin Daisy, RN BSN [place] Management Coordinator [facility] Care Management  [phone]

## 2016-11-13 DIAGNOSIS — M25661 Stiffness of right knee, not elsewhere classified: Secondary | ICD-10-CM | POA: Diagnosis not present

## 2016-11-18 DIAGNOSIS — M25661 Stiffness of right knee, not elsewhere classified: Secondary | ICD-10-CM | POA: Diagnosis not present

## 2016-11-20 DIAGNOSIS — M25661 Stiffness of right knee, not elsewhere classified: Secondary | ICD-10-CM | POA: Diagnosis not present

## 2016-11-25 ENCOUNTER — Other Ambulatory Visit: Payer: Self-pay | Admitting: *Deleted

## 2016-11-25 ENCOUNTER — Encounter: Payer: Self-pay | Admitting: *Deleted

## 2016-11-25 DIAGNOSIS — M25661 Stiffness of right knee, not elsewhere classified: Secondary | ICD-10-CM | POA: Diagnosis not present

## 2016-11-25 NOTE — Patient Outreach (Signed)
[place] [facility]) Care Management  11/25/2016  LETONYA MANGELS 08-Apr-1946 [id number]  Unsuccessful call attempts to patient. No response to outreach letter.  Plan: Close case. Send MD closure letter. Send to care management assistant  Sherrin Daisy, RN BSN [place] Management Coordinator [facility] Care Management  [phone]

## 2016-11-30 DIAGNOSIS — M25661 Stiffness of right knee, not elsewhere classified: Secondary | ICD-10-CM | POA: Diagnosis not present

## 2016-12-02 DIAGNOSIS — M25661 Stiffness of right knee, not elsewhere classified: Secondary | ICD-10-CM | POA: Diagnosis not present

## 2016-12-07 DIAGNOSIS — M25661 Stiffness of right knee, not elsewhere classified: Secondary | ICD-10-CM | POA: Diagnosis not present

## 2016-12-08 DIAGNOSIS — Z96651 Presence of right artificial knee joint: Secondary | ICD-10-CM | POA: Diagnosis not present

## 2016-12-08 DIAGNOSIS — Z471 Aftercare following joint replacement surgery: Secondary | ICD-10-CM | POA: Diagnosis not present

## 2016-12-19 DIAGNOSIS — Z7952 Long term (current) use of systemic steroids: Secondary | ICD-10-CM | POA: Diagnosis not present

## 2016-12-19 DIAGNOSIS — S82041A Displaced comminuted fracture of right patella, initial encounter for closed fracture: Secondary | ICD-10-CM | POA: Diagnosis not present

## 2016-12-19 DIAGNOSIS — M9711XA Periprosthetic fracture around internal prosthetic right knee joint, initial encounter: Secondary | ICD-10-CM | POA: Diagnosis not present

## 2016-12-19 DIAGNOSIS — Z96651 Presence of right artificial knee joint: Secondary | ICD-10-CM | POA: Diagnosis not present

## 2016-12-19 DIAGNOSIS — M25561 Pain in right knee: Secondary | ICD-10-CM | POA: Diagnosis not present

## 2016-12-19 DIAGNOSIS — Z79899 Other long term (current) drug therapy: Secondary | ICD-10-CM | POA: Diagnosis not present

## 2016-12-19 DIAGNOSIS — I10 Essential (primary) hypertension: Secondary | ICD-10-CM | POA: Diagnosis not present

## 2016-12-21 DIAGNOSIS — Z471 Aftercare following joint replacement surgery: Secondary | ICD-10-CM | POA: Diagnosis not present

## 2016-12-21 DIAGNOSIS — Z96651 Presence of right artificial knee joint: Secondary | ICD-10-CM | POA: Diagnosis not present

## 2016-12-22 ENCOUNTER — Other Ambulatory Visit: Payer: Self-pay | Admitting: Orthopedic Surgery

## 2016-12-22 ENCOUNTER — Ambulatory Visit: Payer: Self-pay | Admitting: Orthopedic Surgery

## 2016-12-22 ENCOUNTER — Encounter (HOSPITAL_COMMUNITY): Payer: Self-pay | Admitting: *Deleted

## 2016-12-22 DIAGNOSIS — Z471 Aftercare following joint replacement surgery: Secondary | ICD-10-CM | POA: Diagnosis not present

## 2016-12-22 DIAGNOSIS — Z96651 Presence of right artificial knee joint: Secondary | ICD-10-CM | POA: Diagnosis not present

## 2016-12-22 DIAGNOSIS — S82001A Unspecified fracture of right patella, initial encounter for closed fracture: Secondary | ICD-10-CM | POA: Diagnosis not present

## 2016-12-23 ENCOUNTER — Ambulatory Visit (HOSPITAL_COMMUNITY): Payer: Medicare HMO | Admitting: Anesthesiology

## 2016-12-23 ENCOUNTER — Encounter (HOSPITAL_COMMUNITY): Payer: Self-pay | Admitting: *Deleted

## 2016-12-23 ENCOUNTER — Observation Stay (HOSPITAL_COMMUNITY)
Admission: RE | Admit: 2016-12-23 | Discharge: 2016-12-24 | Disposition: A | Discharge status: Home / Self Care | Payer: Medicare HMO | Source: Ambulatory Visit | Attending: Orthopedic Surgery | Admitting: Orthopedic Surgery

## 2016-12-23 ENCOUNTER — Encounter (HOSPITAL_COMMUNITY)
Admission: RE | Disposition: A | Discharge status: Home / Self Care | Payer: Self-pay | Source: Ambulatory Visit | Attending: Orthopedic Surgery

## 2016-12-23 DIAGNOSIS — Z880 Allergy status to penicillin: Secondary | ICD-10-CM | POA: Diagnosis not present

## 2016-12-23 DIAGNOSIS — M9711XA Periprosthetic fracture around internal prosthetic right knee joint, initial encounter: Secondary | ICD-10-CM | POA: Diagnosis not present

## 2016-12-23 DIAGNOSIS — E7800 Pure hypercholesterolemia, unspecified: Secondary | ICD-10-CM | POA: Insufficient documentation

## 2016-12-23 DIAGNOSIS — Z7982 Long term (current) use of aspirin: Secondary | ICD-10-CM | POA: Diagnosis not present

## 2016-12-23 DIAGNOSIS — X58XXXA Exposure to other specified factors, initial encounter: Secondary | ICD-10-CM | POA: Insufficient documentation

## 2016-12-23 DIAGNOSIS — Z79891 Long term (current) use of opiate analgesic: Secondary | ICD-10-CM | POA: Diagnosis not present

## 2016-12-23 DIAGNOSIS — Z90710 Acquired absence of both cervix and uterus: Secondary | ICD-10-CM | POA: Diagnosis not present

## 2016-12-23 DIAGNOSIS — Z96659 Presence of unspecified artificial knee joint: Secondary | ICD-10-CM

## 2016-12-23 DIAGNOSIS — Z9889 Other specified postprocedural states: Secondary | ICD-10-CM | POA: Diagnosis not present

## 2016-12-23 DIAGNOSIS — M66261 Spontaneous rupture of extensor tendons, right lower leg: Secondary | ICD-10-CM | POA: Diagnosis not present

## 2016-12-23 DIAGNOSIS — Z888 Allergy status to other drugs, medicaments and biological substances status: Secondary | ICD-10-CM | POA: Diagnosis not present

## 2016-12-23 DIAGNOSIS — Z9841 Cataract extraction status, right eye: Secondary | ICD-10-CM | POA: Insufficient documentation

## 2016-12-23 DIAGNOSIS — Z9842 Cataract extraction status, left eye: Secondary | ICD-10-CM | POA: Insufficient documentation

## 2016-12-23 DIAGNOSIS — F329 Major depressive disorder, single episode, unspecified: Secondary | ICD-10-CM | POA: Insufficient documentation

## 2016-12-23 DIAGNOSIS — M069 Rheumatoid arthritis, unspecified: Secondary | ICD-10-CM | POA: Insufficient documentation

## 2016-12-23 DIAGNOSIS — Z7901 Long term (current) use of anticoagulants: Secondary | ICD-10-CM | POA: Diagnosis not present

## 2016-12-23 DIAGNOSIS — M1990 Unspecified osteoarthritis, unspecified site: Secondary | ICD-10-CM | POA: Insufficient documentation

## 2016-12-23 DIAGNOSIS — Z96651 Presence of right artificial knee joint: Secondary | ICD-10-CM | POA: Diagnosis not present

## 2016-12-23 DIAGNOSIS — M978XXA Periprosthetic fracture around other internal prosthetic joint, initial encounter: Secondary | ICD-10-CM

## 2016-12-23 DIAGNOSIS — Z881 Allergy status to other antibiotic agents status: Secondary | ICD-10-CM | POA: Diagnosis not present

## 2016-12-23 DIAGNOSIS — K219 Gastro-esophageal reflux disease without esophagitis: Secondary | ICD-10-CM | POA: Diagnosis not present

## 2016-12-23 DIAGNOSIS — I10 Essential (primary) hypertension: Secondary | ICD-10-CM | POA: Insufficient documentation

## 2016-12-23 DIAGNOSIS — S86811D Strain of other muscle(s) and tendon(s) at lower leg level, right leg, subsequent encounter: Secondary | ICD-10-CM

## 2016-12-23 HISTORY — PX: ORIF PATELLA: SHX5033

## 2016-12-23 HISTORY — DX: Strain of other muscle(s) and tendon(s) at lower leg level, right leg, subsequent encounter: S86.811D

## 2016-12-23 LAB — BASIC METABOLIC PANEL
Anion gap: 10 (ref 5–15)
BUN: 11 mg/dL (ref 6–20)
CALCIUM: 9 mg/dL (ref 8.9–10.3)
CO2: 26 mmol/L (ref 22–32)
CREATININE: 0.85 mg/dL (ref 0.44–1.00)
Chloride: 106 mmol/L (ref 101–111)
GFR calc non Af Amer: >60 mL/min (ref >60)
Glucose, Bld: 97 mg/dL (ref 65–99)
Potassium: 3.5 mmol/L (ref 3.5–5.1)
SODIUM: 142 mmol/L (ref 135–145)

## 2016-12-23 LAB — CBC
HCT: 33.1 % — ABNORMAL LOW (ref 36.0–46.0)
Hemoglobin: 10.3 g/dL — ABNORMAL LOW (ref 12.0–15.0)
MCH: 28 pg (ref 26.0–34.0)
MCHC: 31.1 g/dL (ref 30.0–36.0)
MCV: 89.9 fL (ref 78.0–100.0)
Platelets: 358 10*3/uL (ref 150–400)
RBC: 3.68 MIL/uL — ABNORMAL LOW (ref 3.87–5.11)
RDW: 15.8 % — AB (ref 11.5–15.5)
WBC: 6.3 10*3/uL (ref 4.0–10.5)

## 2016-12-23 SURGERY — OPEN REDUCTION INTERNAL FIXATION (ORIF) PATELLA
Anesthesia: General | Site: Knee | Laterality: Right

## 2016-12-23 MED ORDER — POVIDONE-IODINE 10 % EX SWAB: 2.0000 "application " | Freq: Once | CUTANEOUS | Status: DC

## 2016-12-23 MED ORDER — HYDROMORPHONE HCL 2 MG/ML IJ SOLN
INTRAMUSCULAR | Status: AC
  Administered 2016-12-23: 0.5 mg via INTRAVENOUS
  Filled 2016-12-23: qty 1

## 2016-12-23 MED ORDER — METOCLOPRAMIDE HCL 5 MG/ML IJ SOLN: 5.0000 mg | Freq: Three times a day (TID) | INTRAMUSCULAR | Status: DC | PRN

## 2016-12-23 MED ORDER — LACTATED RINGERS IV SOLN
INTRAVENOUS | Status: DC
  Administered 2016-12-23: 15:00:00 via INTRAVENOUS

## 2016-12-23 MED ORDER — TRAMADOL HCL 50 MG PO TABS: 50.0000 mg | ORAL_TABLET | Freq: Four times a day (QID) | ORAL | Status: DC | PRN

## 2016-12-23 MED ORDER — DEXAMETHASONE SODIUM PHOSPHATE 10 MG/ML IJ SOLN
10.0000 mg | Freq: Once | INTRAMUSCULAR | Status: AC
  Administered 2016-12-23: 10 mg via INTRAVENOUS

## 2016-12-23 MED ORDER — FENTANYL CITRATE (PF) 100 MCG/2ML IJ SOLN
INTRAMUSCULAR | Status: AC
  Filled 2016-12-23: qty 2

## 2016-12-23 MED ORDER — ACETAMINOPHEN 10 MG/ML IV SOLN
1000.0000 mg | Freq: Once | INTRAVENOUS | Status: AC
Start: 2016-12-23 — End: 2016-12-23
  Administered 2016-12-23: 1000 mg via INTRAVENOUS

## 2016-12-23 MED ORDER — BUPIVACAINE HCL (PF) 0.25 % IJ SOLN
INTRAMUSCULAR | Status: AC
  Filled 2016-12-23: qty 30

## 2016-12-23 MED ORDER — PROPOFOL 500 MG/50ML IV EMUL
INTRAVENOUS | Status: DC | PRN
  Administered 2016-12-23: 50 ug/kg/min via INTRAVENOUS

## 2016-12-23 MED ORDER — CEFAZOLIN SODIUM-DEXTROSE 2-4 GM/100ML-% IV SOLN
INTRAVENOUS | Status: AC
  Filled 2016-12-23: qty 100

## 2016-12-23 MED ORDER — MORPHINE SULFATE (PF) 2 MG/ML IV SOLN: 1.0000 mg | INTRAVENOUS | Status: DC | PRN

## 2016-12-23 MED ORDER — DEXTROSE 5 % IV SOLN
500.0000 mg | Freq: Four times a day (QID) | INTRAVENOUS | Status: DC | PRN
  Administered 2016-12-23: 500 mg via INTRAVENOUS
  Filled 2016-12-23: qty 550
  Filled 2016-12-23 (×2): qty 5

## 2016-12-23 MED ORDER — FENTANYL CITRATE (PF) 100 MCG/2ML IJ SOLN
INTRAMUSCULAR | Status: DC | PRN
  Administered 2016-12-23: 25 ug via INTRAVENOUS
  Administered 2016-12-23: 50 ug via INTRAVENOUS
  Administered 2016-12-23: 25 ug via INTRAVENOUS

## 2016-12-23 MED ORDER — ONDANSETRON HCL 4 MG PO TABS: 4.0000 mg | ORAL_TABLET | Freq: Four times a day (QID) | ORAL | Status: DC | PRN

## 2016-12-23 MED ORDER — AMLODIPINE BESYLATE 5 MG PO TABS
2.5000 mg | ORAL_TABLET | Freq: Every day | ORAL | Status: DC
  Administered 2016-12-24: 10:00:00 2.5 mg via ORAL
  Filled 2016-12-23: qty 1

## 2016-12-23 MED ORDER — ACETAMINOPHEN 10 MG/ML IV SOLN
INTRAVENOUS | Status: AC
  Filled 2016-12-23: qty 100

## 2016-12-23 MED ORDER — LIDOCAINE 2% (20 MG/ML) 5 ML SYRINGE
INTRAMUSCULAR | Status: DC | PRN
  Administered 2016-12-23: 60 mg via INTRAVENOUS

## 2016-12-23 MED ORDER — PANTOPRAZOLE SODIUM 40 MG PO TBEC
40.0000 mg | DELAYED_RELEASE_TABLET | Freq: Every day | ORAL | Status: DC
  Administered 2016-12-24: 40 mg via ORAL
  Filled 2016-12-23: qty 1

## 2016-12-23 MED ORDER — ONDANSETRON HCL 4 MG/2ML IJ SOLN
INTRAMUSCULAR | Status: AC
  Filled 2016-12-23: qty 2

## 2016-12-23 MED ORDER — PROPOFOL 10 MG/ML IV BOLUS
INTRAVENOUS | Status: AC
  Filled 2016-12-23: qty 40

## 2016-12-23 MED ORDER — ONDANSETRON HCL 4 MG/2ML IJ SOLN: 4.0000 mg | Freq: Four times a day (QID) | INTRAMUSCULAR | Status: DC | PRN

## 2016-12-23 MED ORDER — BUPIVACAINE HCL (PF) 0.5 % IJ SOLN
INTRAMUSCULAR | Status: DC | PRN
  Administered 2016-12-23: 3 mL via INTRATHECAL

## 2016-12-23 MED ORDER — PROPOFOL 10 MG/ML IV BOLUS
INTRAVENOUS | Status: DC | PRN
  Administered 2016-12-23: 20 mg via INTRAVENOUS
  Administered 2016-12-23: 100 mg via INTRAVENOUS
  Administered 2016-12-23: 20 mg via INTRAVENOUS
  Administered 2016-12-23: 10 mg via INTRAVENOUS

## 2016-12-23 MED ORDER — ENSURE ENLIVE PO LIQD
237.0000 mL | Freq: Two times a day (BID) | ORAL | Status: DC
  Administered 2016-12-24: 237 mL via ORAL

## 2016-12-23 MED ORDER — ACETAMINOPHEN 650 MG RE SUPP: 650.0000 mg | Freq: Four times a day (QID) | RECTAL | Status: DC | PRN

## 2016-12-23 MED ORDER — METOCLOPRAMIDE HCL 5 MG PO TABS: 5.0000 mg | ORAL_TABLET | Freq: Three times a day (TID) | ORAL | Status: DC | PRN

## 2016-12-23 MED ORDER — CEFAZOLIN SODIUM-DEXTROSE 2-4 GM/100ML-% IV SOLN
2.0000 g | Freq: Four times a day (QID) | INTRAVENOUS | Status: AC
  Administered 2016-12-23 – 2016-12-24 (×3): 2 g via INTRAVENOUS
  Filled 2016-12-23 (×3): qty 100

## 2016-12-23 MED ORDER — ACETAMINOPHEN 325 MG PO TABS: 650.0000 mg | ORAL_TABLET | Freq: Four times a day (QID) | ORAL | Status: DC | PRN

## 2016-12-23 MED ORDER — OXYCODONE HCL 5 MG PO TABS
5.0000 mg | ORAL_TABLET | ORAL | Status: DC | PRN
  Administered 2016-12-23 – 2016-12-24 (×4): 5 mg via ORAL
  Filled 2016-12-23 (×4): qty 1

## 2016-12-23 MED ORDER — CEFAZOLIN SODIUM-DEXTROSE 2-4 GM/100ML-% IV SOLN
2.0000 g | INTRAVENOUS | Status: AC
  Administered 2016-12-23: 2 g via INTRAVENOUS

## 2016-12-23 MED ORDER — HYDROMORPHONE HCL 2 MG/ML IJ SOLN
0.2500 mg | INTRAMUSCULAR | Status: DC | PRN
  Administered 2016-12-23 (×4): 0.5 mg via INTRAVENOUS

## 2016-12-23 MED ORDER — METHOCARBAMOL 500 MG PO TABS
500.0000 mg | ORAL_TABLET | Freq: Four times a day (QID) | ORAL | Status: DC | PRN
  Administered 2016-12-24 (×2): 500 mg via ORAL
  Filled 2016-12-23 (×3): qty 1

## 2016-12-23 MED ORDER — SODIUM CHLORIDE 0.9 % IV SOLN
INTRAVENOUS | Status: DC
  Administered 2016-12-23: 20:00:00 via INTRAVENOUS

## 2016-12-23 MED ORDER — DARIFENACIN HYDROBROMIDE ER 15 MG PO TB24
15.0000 mg | ORAL_TABLET | Freq: Every day | ORAL | Status: DC
  Administered 2016-12-24: 10:00:00 15 mg via ORAL
  Filled 2016-12-23: qty 1

## 2016-12-23 MED ORDER — ONDANSETRON HCL 4 MG/2ML IJ SOLN
INTRAMUSCULAR | Status: DC | PRN
  Administered 2016-12-23: 4 mg via INTRAVENOUS

## 2016-12-23 MED ORDER — CHLORHEXIDINE GLUCONATE 4 % EX LIQD: 60.0000 mL | Freq: Once | CUTANEOUS | Status: DC

## 2016-12-23 MED ORDER — LIDOCAINE 2% (20 MG/ML) 5 ML SYRINGE
INTRAMUSCULAR | Status: AC
  Filled 2016-12-23: qty 5

## 2016-12-23 MED ORDER — BUPIVACAINE HCL (PF) 0.5 % IJ SOLN
INTRAMUSCULAR | Status: AC
  Filled 2016-12-23: qty 30

## 2016-12-23 MED ORDER — DEXAMETHASONE SODIUM PHOSPHATE 10 MG/ML IJ SOLN
INTRAMUSCULAR | Status: AC
  Filled 2016-12-23: qty 1

## 2016-12-23 SURGICAL SUPPLY — 47 items
ANCHOR NEEDLE 9/16 CIR SZ 8 (NEEDLE) ×2 IMPLANT
BAG SPEC THK2 15X12 ZIP CLS (MISCELLANEOUS) ×1
BAG ZIPLOCK 12X15 (MISCELLANEOUS) ×2 IMPLANT
BANDAGE ELASTIC 6 VELCRO ST LF (GAUZE/BANDAGES/DRESSINGS) ×2 IMPLANT
BANDAGE ESMARK 6X9 LF (GAUZE/BANDAGES/DRESSINGS) ×1 IMPLANT
BIT DRILL 2.4X128 (BIT) ×2 IMPLANT
BNDG CMPR 9X6 STRL LF SNTH (GAUZE/BANDAGES/DRESSINGS) ×1
BNDG COHESIVE 6X5 TAN STRL LF (GAUZE/BANDAGES/DRESSINGS) ×2 IMPLANT
BNDG ESMARK 6X9 LF (GAUZE/BANDAGES/DRESSINGS) ×2
BNDG GAUZE ELAST 4 BULKY (GAUZE/BANDAGES/DRESSINGS) ×2 IMPLANT
COVER SURGICAL LIGHT HANDLE (MISCELLANEOUS) ×2 IMPLANT
DECANTER SPIKE VIAL GLASS SM (MISCELLANEOUS) ×2 IMPLANT
DRSG ADAPTIC 3X8 NADH LF (GAUZE/BANDAGES/DRESSINGS) ×2 IMPLANT
DRSG EMULSION OIL 3X16 NADH (GAUZE/BANDAGES/DRESSINGS) ×2 IMPLANT
DRSG PAD ABDOMINAL 8X10 ST (GAUZE/BANDAGES/DRESSINGS) ×2 IMPLANT
DURAPREP 26ML APPLICATOR (WOUND CARE) ×2 IMPLANT
ELECT REM PT RETURN 15FT ADLT (MISCELLANEOUS) ×2 IMPLANT
GAUZE SPONGE 4X4 12PLY STRL (GAUZE/BANDAGES/DRESSINGS) ×2 IMPLANT
GLOVE BIO SURGEON STRL SZ7.5 (GLOVE) ×2 IMPLANT
GLOVE BIO SURGEON STRL SZ8 (GLOVE) ×2 IMPLANT
GLOVE BIOGEL PI IND STRL 8 (GLOVE) ×3 IMPLANT
GLOVE BIOGEL PI INDICATOR 8 (GLOVE) ×3
GOWN STRL REUS W/TWL LRG LVL3 (GOWN DISPOSABLE) ×2 IMPLANT
GOWN STRL REUS W/TWL XL LVL3 (GOWN DISPOSABLE) ×2 IMPLANT
IMMOBILIZER KNEE 20 (SOFTGOODS) ×2
IMMOBILIZER KNEE 20 THIGH 36 (SOFTGOODS) ×1 IMPLANT
KIT BASIN OR (CUSTOM PROCEDURE TRAY) ×2 IMPLANT
MANIFOLD NEPTUNE II (INSTRUMENTS) ×2 IMPLANT
NS IRRIG 1000ML POUR BTL (IV SOLUTION) ×2 IMPLANT
PACK TOTAL JOINT (CUSTOM PROCEDURE TRAY) ×2 IMPLANT
PAD ABD 8X10 STRL (GAUZE/BANDAGES/DRESSINGS) ×1 IMPLANT
PADDING CAST COTTON 6X4 STRL (CAST SUPPLIES) ×2 IMPLANT
PASSER SUT SWANSON 36MM LOOP (INSTRUMENTS) ×2 IMPLANT
POSITIONER SURGICAL ARM (MISCELLANEOUS) ×2 IMPLANT
STRIP CLOSURE SKIN 1/2X4 (GAUZE/BANDAGES/DRESSINGS) ×2 IMPLANT
SUT ETHIBOND NAB CT1 #1 30IN (SUTURE) ×4 IMPLANT
SUT MNCRL AB 4-0 PS2 18 (SUTURE) ×2 IMPLANT
SUT STEEL 7 (SUTURE) ×4 IMPLANT
SUT VIC AB 0 CT1 27 (SUTURE) ×4
SUT VIC AB 0 CT1 27XBRD ANTBC (SUTURE) ×2 IMPLANT
SUT VIC AB 1 CT1 27 (SUTURE) ×2
SUT VIC AB 1 CT1 27XBRD ANTBC (SUTURE) ×1 IMPLANT
SUT VIC AB 2-0 CT1 27 (SUTURE) ×2
SUT VIC AB 2-0 CT1 TAPERPNT 27 (SUTURE) ×1 IMPLANT
TOWEL OR 17X26 10 PK STRL BLUE (TOWEL DISPOSABLE) ×4 IMPLANT
WATER STERILE IRR 1500ML POUR (IV SOLUTION) ×2 IMPLANT
WRAP KNEE MAXI GEL POST OP (GAUZE/BANDAGES/DRESSINGS) ×2 IMPLANT

## 2016-12-23 NOTE — H&P (Signed)
CC- Brittany Stark is a 71 y.o. female who presents with right knee pain.  HPI- . Knee Pain: Patient presents with knee pain involving the  right knee. Onset of the symptoms was several days ago. Inciting event: She was standing and felt a pop in her right knee and the knee buckled leading to her falling. She did not get injured in the fall. She was unable to straighten her knee. She had a right total knee arthroplasty approximately 8 weeks ago and was doing great prior to this episode. She was seen in urgern care and told she did not have a fracture. She was then seen in our office on 4/9 and x rays showed a displace paaeriprosthetic patella fracture. She present now for fixation.    Past Medical History:  Diagnosis Date  . Arthritis    rheumatoid and osteoarthritis   . Depression   . Elevated cholesterol    currently under control  . GERD (gastroesophageal reflux disease)    laryngopharyngeal reflux  . Hemorrhoids   . Hypertension     Past Surgical History:  Procedure Laterality Date  . ABDOMINAL HYSTERECTOMY    . BREAST SURGERY     breast reduction   . CATARACT EXTRACTION     both eyes  . COLONOSCOPY     hx polyps  . CYSTOSCOPY W/ RETROGRADES Right 05/01/2014   Procedure: CYSTOSCOPY WITH RIGHT RETROGRADE PYELOGRAM;  Surgeon: Malka So, MD;  Location: [facility];  Service: Urology;  Laterality: Right;  . CYSTOSCOPY WITH BIOPSY  05/01/2014   Procedure: CYSTOSCOPY WITH bladder BIOPSY;  Surgeon: Malka So, MD;  Location: [facility];  Service: Urology;;  . KNEE ARTHROSCOPY  1995  . right ankle surgery      has pins   . right arm surgery      pins in right arm   . TOTAL KNEE ARTHROPLASTY Right 11/02/2016   Procedure: RIGHT TOTAL KNEE ARTHROPLASTY;  Surgeon: Gaynelle Arabian, MD;  Location: [facility];  Service: Orthopedics;  Laterality: Right;  with abductor block    Prior to Admission medications   Medication Sig Start Date End Date Taking? Authorizing Provider  amLODipine (NORVASC) 2.5  MG tablet Take 2.5 mg by mouth daily. 10/05/16  Yes Historical Provider, MD  aspirin EC 81 MG tablet Take 81 mg by mouth daily.   Yes Historical Provider, MD  omeprazole (PRILOSEC) 20 MG capsule TAKE 1 CAPSULE(20 MG) BY MOUTH DAILY 11/12/16  Yes Irene Shipper, MD  predniSONE (DELTASONE) 5 MG tablet Take 5 mg by mouth daily as needed (arthritis).    Yes Historical Provider, MD  traMADol (ULTRAM) 50 MG tablet Take 1-2 tablets (50-100 mg total) by mouth every 6 (six) hours as needed for moderate pain. 11/03/16  Yes Alexzandrew L Perkins, PA-C  VESICARE 10 MG tablet Take 10 mg by mouth daily. 09/07/16  Yes Historical Provider, MD  iron polysaccharides (NIFEREX) 150 MG capsule Take 1 capsule (150 mg total) by mouth 2 (two) times daily. Patient not taking: Reported on 12/23/2016 11/04/16   Alexzandrew L Dara Lords, PA-C  methocarbamol (ROBAXIN) 500 MG tablet Take 1 tablet (500 mg total) by mouth every 6 (six) hours as needed for muscle spasms. Patient not taking: Reported on 12/23/2016 11/03/16   Alexzandrew L Perkins, PA-C  oxyCODONE (OXY IR/ROXICODONE) 5 MG immediate release tablet Take 1-2 tablets (5-10 mg total) by mouth every 4 (four) hours as needed for moderate pain or severe pain. Patient not taking: Reported on 12/23/2016  11/03/16   Alexzandrew L Perkins, PA-C  rivaroxaban (XARELTO) 10 MG TABS tablet Take 1 tablet (10 mg total) by mouth daily with breakfast. Take Xarelto for two and a half more weeks following discharge from the hospital, then discontinue Xarelto. Once the patient has completed the blood thinner regimen, then take a Baby 81 mg Aspirin daily for three more weeks. Patient not taking: Reported on 12/23/2016 11/04/16   Alexzandrew L Dara Lords, PA-C   KNEE EXAM antalgic gait, effusion, collateral ligaments intact, unable to actively extend knee, palpable defect in patella  Physical Examination: General appearance - alert, well appearing, and in no distress Mental status - alert, oriented to person,  place, and time Chest - clear to auscultation, no wheezes, rales or rhonchi, symmetric air entry Heart - normal rate, regular rhythm, normal S1, S2, no murmurs, rubs, clicks or gallops Abdomen - soft, nontender, nondistended, no masses or organomegaly Neurological - alert, oriented, normal speech, no focal findings or movement disorder noted    Asessment/Plan--- Left periprosthetic patella fracture - - Plan ORIF vs patella tendon repair depending on size and stability of inferior fragment. Procedure risks and potential comps discussed with patient who elects to proceed. Goals are decreased pain and increased function with a high likelihood of achieving both

## 2016-12-23 NOTE — Anesthesia Procedure Notes (Signed)
Procedure Name: LMA Insertion Date/Time: 12/23/2016 5:23 PM Performed by: Dione Booze Pre-anesthesia Checklist: Patient identified, Emergency Drugs available, Suction available and Patient being monitored Patient Re-evaluated:Patient Re-evaluated prior to inductionOxygen Delivery Method: Circle system utilized Preoxygenation: Pre-oxygenation with 100% oxygen Intubation Type: IV induction LMA: LMA inserted LMA Size: 4.0 Number of attempts: 1 Placement Confirmation: positive ETCO2

## 2016-12-23 NOTE — Anesthesia Postprocedure Evaluation (Addendum)
Anesthesia Post Note  Patient: Brittany Stark  Procedure(s) Performed: Procedure(s) (LRB): RIGHT PATELLA TENDON REPAIR (Right)  Patient location during evaluation: PACU Anesthesia Type: General Level of consciousness: awake and alert Pain management: pain level controlled Vital Signs Assessment: post-procedure vital signs reviewed and stable Respiratory status: spontaneous breathing, nonlabored ventilation, respiratory function stable and patient connected to nasal cannula oxygen Cardiovascular status: blood pressure returned to baseline and stable Postop Assessment: no signs of nausea or vomiting Anesthetic complications: no Comments: Motor block on R LE, but no sensory block of R patella per patient and exam.        Last Vitals:  Vitals:   12/23/16 1930 12/23/16 1945  BP: 133/73 (!) 146/79  Pulse: 66 70  Resp: 14 15  Temp:  36.3 C    Last Pain:  Vitals:   12/23/16 2018  TempSrc:   PainSc: [place]

## 2016-12-23 NOTE — Anesthesia Preprocedure Evaluation (Signed)
Anesthesia Evaluation  Patient identified by MRN, date of birth, ID band Patient awake    Reviewed: Allergy & Precautions, NPO status , Patient's Chart, lab work & pertinent test results  Airway Mallampati: II  TM Distance: >3 FB Neck ROM: Full    Dental  (+) Teeth Intact, Dental Advisory Given   Pulmonary    breath sounds clear to auscultation       Cardiovascular hypertension, Pt. on medications  Rhythm:Regular Rate:Normal     Neuro/Psych PSYCHIATRIC DISORDERS Depression    GI/Hepatic Neg liver ROS, GERD  Medicated,  Endo/Other  negative endocrine ROS  Renal/GU Renal disease  negative genitourinary   Musculoskeletal  (+) Arthritis , Osteoarthritis,    Abdominal   Peds negative pediatric ROS (+)  Hematology negative hematology ROS (+)   Anesthesia Other Findings   Reproductive/Obstetrics negative OB ROS                             Lab Results  Component Value Date   WBC 6.3 12/23/2016   HGB 10.3 (L) 12/23/2016   HCT 33.1 (L) 12/23/2016   MCV 89.9 12/23/2016   PLT 358 12/23/2016   Lab Results  Component Value Date   CREATININE 0.85 12/23/2016   BUN 11 12/23/2016   NA 142 12/23/2016   K 3.5 12/23/2016   CL 106 12/23/2016   CO2 26 12/23/2016   Lab Results  Component Value Date   INR 1.06 10/26/2016     Anesthesia Physical Anesthesia Plan  ASA: II  Anesthesia Plan: Spinal   Post-op Pain Management:    Induction: Intravenous  Airway Management Planned: Natural Airway  Additional Equipment:   Intra-op Plan:   Post-operative Plan:   Informed Consent: I have reviewed the patients History and Physical, chart, labs and discussed the procedure including the risks, benefits and alternatives for the proposed anesthesia with the patient or authorized representative who has indicated his/her understanding and acceptance.     Plan Discussed with:   Anesthesia Plan  Comments: (No longer on rivaroxaban)        Anesthesia Quick Evaluation

## 2016-12-23 NOTE — Brief Op Note (Signed)
12/23/2016  6:01 PM  PATIENT:  Brittany Stark  71 y.o. female  PRE-OPERATIVE DIAGNOSIS:  Right periprosthetic patella fracture  POST-OPERATIVE DIAGNOSIS:  Right periprosthetic patella fracture  PROCEDURE:  Procedure(s): RIGHT PATELLA TENDON REPAIR (Right)  SURGEON:  Surgeon(s) and Role:    * Gaynelle Arabian, MD - Primary  PHYSICIAN ASSISTANT:   ASSISTANTS: Arlee Muslim, PA-C   ANESTHESIA:   general  EBL:  Total I/O In: -  Out: 100 [Urine:75; Blood:25]  BLOOD ADMINISTERED:none  DRAINS: none   LOCAL MEDICATIONS USED:  NONE  COUNTS:  YES  TOURNIQUET:   Total Tourniquet Time Documented: Thigh (Right) - 27 minutes Total: Thigh (Right) - 27 minutes   DICTATION: .Other Dictation: Dictation Number [id number]  PLAN OF CARE: Admit for overnight observation  PATIENT DISPOSITION:  PACU - hemodynamically stable.

## 2016-12-23 NOTE — Transfer of Care (Signed)
Immediate Anesthesia Transfer of Care Note  Patient: [name]  Procedure(s) Performed: Procedure(s): RIGHT PATELLA TENDON REPAIR (Right)  Patient Location: PACU  Anesthesia Type:General and Spinal  Level of Consciousness: awake and patient cooperative  Airway & Oxygen Therapy: Patient Spontanous Breathing and Patient connected to face mask oxygen  Post-op Assessment: Report given to RN and Post -op Vital signs reviewed and stable  Post vital signs: Reviewed and stable  Last Vitals:  Vitals:   12/23/16 1514  BP: 140/84  Pulse: 83  Resp: 20  Temp: 36.7 C    Last Pain:  Vitals:   12/23/16 1514  TempSrc: Oral  PainSc: 4       Patients Stated Pain Goal: 4 (00/51/10 2111)  Complications: No apparent anesthesia complications

## 2016-12-23 NOTE — Anesthesia Procedure Notes (Signed)
Spinal  Patient location during procedure: OR Start time: 12/23/2016 5:01 PM End time: 12/23/2016 5:03 PM Staffing Anesthesiologist: Suella Broad D Performed: anesthesiologist  Preanesthetic Checklist Completed: patient identified, site marked, surgical consent, pre-op evaluation, timeout performed, IV checked, risks and benefits discussed and monitors and equipment checked Spinal Block Patient position: sitting Prep: Betadine Patient monitoring: heart rate, continuous pulse ox, blood pressure and cardiac monitor Approach: midline Location: L4-5 Injection technique: single-shot Needle Needle type: Introducer and Sprotte  Needle gauge: 24 G Needle length: 9 cm Additional Notes Negative paresthesia. Negative blood return. Positive free-flowing CSF. Expiration date of kit checked and confirmed. Patient tolerated procedure well, without complications.

## 2016-12-23 NOTE — Anesthesia Procedure Notes (Signed)
Procedure Name: MAC Date/Time: 12/23/2016 5:07 PM Performed by: Dione Booze Pre-anesthesia Checklist: Patient identified, Emergency Drugs available, Suction available and Patient being monitored Patient Re-evaluated:Patient Re-evaluated prior to inductionOxygen Delivery Method: Simple face mask Placement Confirmation: positive ETCO2

## 2016-12-24 ENCOUNTER — Encounter (HOSPITAL_COMMUNITY): Payer: Self-pay | Admitting: Orthopedic Surgery

## 2016-12-24 DIAGNOSIS — F329 Major depressive disorder, single episode, unspecified: Secondary | ICD-10-CM | POA: Diagnosis not present

## 2016-12-24 DIAGNOSIS — M1990 Unspecified osteoarthritis, unspecified site: Secondary | ICD-10-CM | POA: Diagnosis not present

## 2016-12-24 DIAGNOSIS — M069 Rheumatoid arthritis, unspecified: Secondary | ICD-10-CM | POA: Diagnosis not present

## 2016-12-24 DIAGNOSIS — K219 Gastro-esophageal reflux disease without esophagitis: Secondary | ICD-10-CM | POA: Diagnosis not present

## 2016-12-24 DIAGNOSIS — M9711XA Periprosthetic fracture around internal prosthetic right knee joint, initial encounter: Secondary | ICD-10-CM | POA: Diagnosis not present

## 2016-12-24 DIAGNOSIS — Z7901 Long term (current) use of anticoagulants: Secondary | ICD-10-CM | POA: Diagnosis not present

## 2016-12-24 DIAGNOSIS — I10 Essential (primary) hypertension: Secondary | ICD-10-CM | POA: Diagnosis not present

## 2016-12-24 DIAGNOSIS — E7800 Pure hypercholesterolemia, unspecified: Secondary | ICD-10-CM | POA: Diagnosis not present

## 2016-12-24 DIAGNOSIS — Z96651 Presence of right artificial knee joint: Secondary | ICD-10-CM | POA: Diagnosis not present

## 2016-12-24 MED ORDER — TRAMADOL HCL 50 MG PO TABS: 50.0000 mg | ORAL_TABLET | Freq: Four times a day (QID) | ORAL | 0 refills | Status: DC | PRN

## 2016-12-24 MED ORDER — ASPIRIN 325 MG PO TABS: 325.0000 mg | ORAL_TABLET | Freq: Every day | ORAL | 0 refills | Status: DC

## 2016-12-24 MED ORDER — OXYCODONE HCL 5 MG PO TABS: 5.0000 mg | ORAL_TABLET | ORAL | 0 refills | Status: DC | PRN

## 2016-12-24 MED ORDER — ASPIRIN 325 MG PO TABS
325.0000 mg | ORAL_TABLET | Freq: Every day | ORAL | Status: DC
Start: 2016-12-24 — End: 2016-12-24
  Administered 2016-12-24: 325 mg via ORAL
  Filled 2016-12-24: qty 1

## 2016-12-24 MED ORDER — METHOCARBAMOL 500 MG PO TABS: 500.0000 mg | ORAL_TABLET | Freq: Four times a day (QID) | ORAL | 0 refills | Status: DC | PRN

## 2016-12-24 NOTE — Progress Notes (Signed)
   Subjective: 1 Day Post-Op Procedure(s) (LRB): RIGHT PATELLA TENDON REPAIR (Right) Patient reports pain as mild.   Patient seen in rounds by Dr. Wynelle Link. Patient is well, but has had some minor complaints of pain in the knee, requiring pain medications Patient is ready to go home today following therapy WBAT but no ROm or bending of the knee. Knee immobilizer at all times except for dressing change and bathing  Objective: Vital signs in last 24 hours: Temp:  [96.8 F (36 C)-98.1 F (36.7 C)] 97 F (36.1 C) (04/12 0504) Pulse Rate:  [64-83] 67 (04/12 0504) Resp:  [13-20] 16 (04/12 0504) BP: (121-146)/(69-84) 125/69 (04/12 0504) SpO2:  [99 %-100 %] 100 % (04/12 0504) Weight:  [78 kg (172 lb)] 78 kg (172 lb) (04/11 1514)  Intake/Output from previous day:  Intake/Output Summary (Last 24 hours) at 12/24/16 0923 Last data filed at 12/24/16 0800  Gross per 24 hour  Intake             2860 ml  Output              465 ml  Net             2395 ml    Intake/Output this shift: Total I/O In: 120 [P.O.:120] Out: -   Labs:  Recent Labs  12/23/16 1503  HGB 10.3*    Recent Labs  12/23/16 1503  WBC 6.3  RBC 3.68*  HCT 33.1*  PLT 358    Recent Labs  12/23/16 1503  NA 142  K 3.5  CL 106  CO2 26  BUN 11  CREATININE 0.85  GLUCOSE 97  CALCIUM 9.0   No results for input(s): LABPT, INR in the last 72 hours.  EXAM: General - Patient is Alert, Appropriate and Oriented Extremity - Neurovascular intact Sensation intact distally Intact pulses distally Dorsiflexion/Plantar flexion intact Dressing - clean, dry Motor Function - intact, moving foot and toes well on exam.   Assessment/Plan: 1 Day Post-Op Procedure(s) (LRB): RIGHT PATELLA TENDON REPAIR (Right) Procedure(s) (LRB): RIGHT PATELLA TENDON REPAIR (Right) Past Medical History:  Diagnosis Date  . Arthritis    rheumatoid and osteoarthritis   . Depression   . Elevated cholesterol    currently under control   . GERD (gastroesophageal reflux disease)    laryngopharyngeal reflux  . Hemorrhoids   . Hypertension    Principal Problem:   Peri-prosthetic patellar fracture Active Problems:   Patellar tendon rupture, right, subsequent encounter  Estimated body mass index is 26.94 kg/m as calculated from the following:   Height as of this encounter: 5\' 7"  (1.702 m).   Weight as of this encounter: 78 kg (172 lb). Up with therapy Discharge home with home health Diet - Cardiac diet Follow up - in 2 weeks Activity - WBAT Disposition - Home Condition Upon Discharge - Good D/C Meds - See DC Summary DVT Prophylaxis - Aspirin 325 mg daily for three weeks, then resume the baby 81 mg Aspirin daily at home.  Arlee Muslim, PA-C Orthopaedic Surgery 12/24/2016, 9:23 AM

## 2016-12-24 NOTE — Progress Notes (Signed)
Spoke with patient at bedside. Discussed referral for HHPT, patient is agreeable. Patient lives at home with son, has RW. Requesting shower chair, plans to purchase herself after d/c. No preference for [facility] agency, contacted [facility] for referral.

## 2016-12-24 NOTE — Progress Notes (Signed)
Nutrition Brief Note  Patient identified on the Malnutrition Screening Tool (MST) Report.  Wt Readings from Last 15 Encounters:  12/23/16 172 lb (78 kg)  11/02/16 178 lb 1.6 oz (80.8 kg)  10/26/16 178 lb 1.6 oz (80.8 kg)  10/31/15 190 lb (86.2 kg)  10/11/15 190 lb (86.2 kg)  04/30/14 170 lb (77.1 kg)    Body mass index is 26.94 kg/m. Patient meets criteria for overweight based on current BMI. R knee incision from 4/11 d/t surgical intervenion for periprosthetic patellar fx. Pt had R TKA on 11/02/16.   Current diet order is Regular and pt consumed 100% of breakfast (raisin bran, frosted flakes, whole milk, blueberry muffin, and coffee) which was ~590 kcal and 14 grams of protein. Lunch meal has been ordered (balsamic chicken, whipped potatoes, green beans, dinner roll, and sweet tea) which is ~530 kcal and 27 grams of protein. Labs and medications reviewed.   Discharge order but no discharge summary in place. No nutrition interventions warranted at this time. If nutrition issues arise, please consult RD.     Jarome Matin, MS, RD, LDN, [facility] Inpatient Clinical Dietitian Pager # [phone] After hours/weekend pager # [phone]

## 2016-12-24 NOTE — Progress Notes (Signed)
Physical Therapy Treatment Patient Details Name: Brittany Stark MRN: [id number] DOB: April 04, 1946 Today's Date: 12/24/2016    History of Present Illness 71 yo female s/p R TKA 11/02/16 and now with periprosthetic patellar fx.  Pt s/p R patellar tendon repair  12/23/16    PT Comments    Pt continues emotional and pain ltd but progressing steadily with mobility.  Spouse present this session and pt eager for dc home.   Follow Up Recommendations  Home health PT     Equipment Recommendations  None recommended by PT    Recommendations for Other Services       Precautions / Restrictions Precautions Precautions: Fall;Other (comment) Precaution Comments: NO ROM R KNEE Required Braces or Orthoses: Knee Immobilizer - Right Knee Immobilizer - Right: On at all times Restrictions Weight Bearing Restrictions: No Other Position/Activity Restrictions: WBAT WITH KI IN PLACE    Mobility  Bed Mobility Overal bed mobility: Needs Assistance Bed Mobility: Supine to Sit     Supine to sit: Min guard     General bed mobility comments: Increased time and cues for sequence and use of leg lifter to self assist  Transfers Overall transfer level: Needs assistance Equipment used: Rolling walker (2 wheeled) Transfers: Sit to/from Stand Sit to Stand: Min guard         General transfer comment: cues for LE management and use of UEs to self assist.    Ambulation/Gait Ambulation/Gait assistance: Min guard;Supervision Ambulation Distance (Feet): 65 Feet Assistive device: Rolling walker (2 wheeled) Gait Pattern/deviations: Step-to pattern;Decreased step length - right;Decreased step length - left;Shuffle;Trunk flexed Gait velocity: decr Gait velocity interpretation: Below normal speed for age/gender General Gait Details: Increased time with cues for seqence, posture and position from [employer]            Wheelchair Mobility    Modified Rankin (Stroke Patients Only)       Balance                                             Cognition Arousal/Alertness: Awake/alert Behavior During Therapy: WFL for tasks assessed/performed Overall Cognitive Status: Within Functional Limits for tasks assessed                                        Exercises      General Comments        Pertinent Vitals/Pain Pain Assessment: 0-10 Pain Score: 5  Pain Location: R knee Pain Descriptors / Indicators: Aching;Sore Pain Intervention(s): Limited activity within patient's tolerance;Monitored during session;Premedicated before session    Home Living                      Prior Function            PT Goals (current goals can now be found in the care plan section) Acute Rehab PT Goals Patient Stated Goal: Regain IND PT Goal Formulation: With patient Time For Goal Achievement: 12/26/16 Potential to Achieve Goals: Good Progress towards PT goals: Progressing toward goals    Frequency    7X/week      PT Plan Current plan remains appropriate    Co-evaluation             End of Session Equipment Utilized During  Treatment: Gait belt;Right knee immobilizer Activity Tolerance: Patient tolerated treatment well Patient left: Other (comment) (BSC) Nurse Communication: Mobility status PT Visit Diagnosis: Unsteadiness on feet (R26.81);Difficulty in walking, not elsewhere classified (R26.2);Pain Pain - Right/Left: Right Pain - part of body: Knee     Time: 6153-7943 PT Time Calculation (min) (ACUTE ONLY): 24 min  Charges:  $Gait Training: 8-22 mins $Therapeutic Activity: 8-22 mins                    G Codes:       Pg [phone]    Arthuro Canelo 12/24/2016, 4:40 PM

## 2016-12-24 NOTE — Discharge Summary (Signed)
Physician Discharge Summary   Patient ID: Brittany Stark MRN: [id number] DOB/AGE: 1946/04/05 71 y.o.  Admit date: 12/23/2016 Discharge date: 12/24/2016  Primary Diagnosis:   Right periprosthetic patella fracture  Admission Diagnoses:  Past Medical History:  Diagnosis Date  . Arthritis    rheumatoid and osteoarthritis   . Depression   . Elevated cholesterol    currently under control  . GERD (gastroesophageal reflux disease)    laryngopharyngeal reflux  . Hemorrhoids   . Hypertension    Discharge Diagnoses:   Principal Problem:   Peri-prosthetic patellar fracture Active Problems:   Patellar tendon rupture, right, subsequent encounter  Procedure:  Procedure(s) (LRB): RIGHT PATELLA TENDON REPAIR (Right)   Consults: None  HPI: Brittany Stark is a 71 year old female, who had a right total knee arthroplasty done approximately 2 months ago, was doing extremely well, but I felt a sudden pop in her right knee approximately 4 days ago.  She was initially seen in Urgent Care and told she did not have any injury.  She was subsequently seen in our office 2 days later and noted to have a periprosthetic patellar fracture.  She subsequently was taken to the operating room today for fixation versus excision of the inferior fragments and patellar tendon repair.     Laboratory Data: Admission on 11/02/2016, Discharged on 11/04/2016  Component Date Value Ref Range Status  . WBC 11/03/2016 8.0  4.0 - 10.5 K/uL Final  . RBC 11/03/2016 3.34* 3.87 - 5.11 MIL/uL Final  . Hemoglobin 11/03/2016 9.8* 12.0 - 15.0 g/dL Final  . HCT 11/03/2016 30.7* 36.0 - 46.0 % Final  . MCV 11/03/2016 91.9  78.0 - 100.0 fL Final  . MCH 11/03/2016 29.3  26.0 - 34.0 pg Final  . MCHC 11/03/2016 31.9  30.0 - 36.0 g/dL Final  . RDW 11/03/2016 14.8  11.5 - 15.5 % Final  . Platelets 11/03/2016 235  150 - 400 K/uL Final  . Sodium 11/03/2016 138  135 - 145 mmol/L Final  . Potassium 11/03/2016 3.6  3.5 - 5.1 mmol/L  Final  . Chloride 11/03/2016 105  101 - 111 mmol/L Final  . CO2 11/03/2016 27  22 - 32 mmol/L Final  . Glucose, Bld 11/03/2016 125* 65 - 99 mg/dL Final  . BUN 11/03/2016 9  6 - 20 mg/dL Final  . Creatinine, Ser 11/03/2016 0.68  0.44 - 1.00 mg/dL Final  . Calcium 11/03/2016 8.7* 8.9 - 10.3 mg/dL Final  . GFR calc non Af Amer 11/03/2016 >60  >60 mL/min Final  . GFR calc Af Amer 11/03/2016 >60  >60 mL/min Final   Comment: (NOTE) The eGFR has been calculated using the CKD EPI equation. This calculation has not been validated in all clinical situations. eGFR's persistently <60 mL/min signify possible Chronic Kidney Disease.   . Anion gap 11/03/2016 6  5 - 15 Final  . WBC 11/04/2016 9.3  4.0 - 10.5 K/uL Final  . RBC 11/04/2016 3.04* 3.87 - 5.11 MIL/uL Final  . Hemoglobin 11/04/2016 8.8* 12.0 - 15.0 g/dL Final  . HCT 11/04/2016 27.5* 36.0 - 46.0 % Final  . MCV 11/04/2016 90.5  78.0 - 100.0 fL Final  . MCH 11/04/2016 28.9  26.0 - 34.0 pg Final  . MCHC 11/04/2016 32.0  30.0 - 36.0 g/dL Final  . RDW 11/04/2016 15.1  11.5 - 15.5 % Final  . Platelets 11/04/2016 237  150 - 400 K/uL Final  . Sodium 11/04/2016 142  135 - 145 mmol/L Final  .  Potassium 11/04/2016 3.6  3.5 - 5.1 mmol/L Final  . Chloride 11/04/2016 108  101 - 111 mmol/L Final  . CO2 11/04/2016 29  22 - 32 mmol/L Final  . Glucose, Bld 11/04/2016 125* 65 - 99 mg/dL Final  . BUN 11/04/2016 11  6 - 20 mg/dL Final  . Creatinine, Ser 11/04/2016 0.70  0.44 - 1.00 mg/dL Final  . Calcium 11/04/2016 8.7* 8.9 - 10.3 mg/dL Final  . GFR calc non Af Amer 11/04/2016 >60  >60 mL/min Final  . GFR calc Af Amer 11/04/2016 >60  >60 mL/min Final   Comment: (NOTE) The eGFR has been calculated using the CKD EPI equation. This calculation has not been validated in all clinical situations. eGFR's persistently <60 mL/min signify possible Chronic Kidney Disease.   . Anion gap 11/04/2016 5  5 - 15 Final    Recent Labs  12/23/16 1503  HGB 10.3*     Recent Labs  12/23/16 1503  WBC 6.3  RBC 3.68*  HCT 33.1*  PLT 358    Recent Labs  12/23/16 1503  NA 142  K 3.5  CL 106  CO2 26  BUN 11  CREATININE 0.85  GLUCOSE 97  CALCIUM 9.0   No results for input(s): LABPT, INR in the last 72 hours.  X-Rays:No results found.  EKG:No orders found for this or any previous visit.   Hospital Course: Patient was admitted to [facility] and taken to the OR and underwent the above state procedure without complications.  Patient tolerated the procedure well and was later transferred to the recovery room and then to the orthopaedic floor for postoperative care.  They were given PO and IV analgesics for pain control following their surgery.  They were given 24 hours of postoperative antibiotics.   PT was consulted postop to assist with mobility and transfers.  The patient was allowed to be WBAT with therapy but no motion or bending to the knee. Discharge planning was consulted to help with postop disposition and equipment needs.  Patient had a good night on the evening of surgery and started to get up OOB with therapy on day one. Patient was seen in rounds and was ready to go home on day one.  They were given discharge instructions and dressing directions.  They were instructed on when to follow up in the office with Dr. Wynelle Link.   Discharge home with home health Diet - Cardiac diet Follow up - in 2 weeks Activity - WBAT Disposition - Home Condition Upon Discharge - Good D/C Meds - See DC Summary DVT Prophylaxis - Aspirin 325 mg daily for three weeks, then resume the baby 81 mg Aspirin daily at home.   Discharge Instructions    Call MD / Call 911    Complete by:  As directed    If you experience chest pain or shortness of breath, CALL 911 and be transported to the hospital emergency room.  If you develope a fever above 101 F, pus (white drainage) or increased drainage or redness at the wound, or calf pain, call your surgeon's  office.   Change dressing    Complete by:  As directed    Change dressing daily with sterile 4 x 4 inch gauze dressing and apply TED hose. Do not submerge the incision under water.   Constipation Prevention    Complete by:  As directed    Drink plenty of fluids.  Prune juice may be helpful.  You may use a stool  softener, such as Colace (over the counter) 100 mg twice a day.  Use MiraLax (over the counter) for constipation as needed.   Diet - low sodium heart healthy    Complete by:  As directed    Discharge instructions    Complete by:  As directed    Pick up stool softner and laxative for home use following surgery while on pain medications. Do not submerge incision under water. Please use good hand washing techniques while changing dressing each day. May shower starting three days after surgery. Please use a clean towel to pat the incision dry following showers. Continue to use ice for pain and swelling after surgery. Do not use any lotions or creams on the incision until instructed by your surgeon.  Wear both TED hose on both legs during the day every day for three weeks, but may have off at night at home.  Postoperative Constipation Protocol  Constipation - defined medically as fewer than three stools per week and severe constipation as less than one stool per week.  One of the most common issues patients have following surgery is constipation.  Even if you have a regular bowel pattern at home, your normal regimen is likely to be disrupted due to multiple reasons following surgery.  Combination of anesthesia, postoperative narcotics, change in appetite and fluid intake all can affect your bowels.  In order to avoid complications following surgery, here are some recommendations in order to help you during your recovery period.  Colace (docusate) - Pick up an over-the-counter form of Colace or another stool softener and take twice a day as long as you are requiring postoperative pain  medications.  Take with a full glass of water daily.  If you experience loose stools or diarrhea, hold the colace until you stool forms back up.  If your symptoms do not get better within 1 week or if they get worse, check with your doctor.  Dulcolax (bisacodyl) - Pick up over-the-counter and take as directed by the product packaging as needed to assist with the movement of your bowels.  Take with a full glass of water.  Use this product as needed if not relieved by Colace only.   MiraLax (polyethylene glycol) - Pick up over-the-counter to have on hand.  MiraLax is a solution that will increase the amount of water in your bowels to assist with bowel movements.  Take as directed and can mix with a glass of water, juice, soda, coffee, or tea.  Take if you go more than two days without a movement. Do not use MiraLax more than once per day. Call your doctor if you are still constipated or irregular after using this medication for 7 days in a row.  If you continue to have problems with postoperative constipation, please contact the office for further assistance and recommendations.  If you experience "the worst abdominal pain ever" or develop nausea or vomiting, please contact the office immediatly for further recommendations for treatment.   Take a full dose 325 mg Aspirin daily for three weeks, then reduce back to and resume the 81 mg Aspirin daily at home.   Do not sit on low chairs, stoools or toilet seats, as it may be difficult to get up from low surfaces    Complete by:  As directed    Driving restrictions    Complete by:  As directed    No driving until released by the physician.   Increase activity slowly as tolerated  Complete by:  As directed    Lifting restrictions    Complete by:  As directed    No lifting until released by the physician.   Patient may shower    Complete by:  As directed    You may shower without a dressing once there is no drainage.  Do not wash over the wound.  If  drainage remains, do not shower until drainage stops.   TED hose    Complete by:  As directed    Use stockings (TED hose) for 3 weeks on both leg(s).  You may remove them at night for sleeping.   Weight bearing as tolerated    Complete by:  As directed    Laterality:  right   Extremity:  Lower     Allergies as of 12/24/2016      Reactions   Amoxicillin Other (See Comments)   Yeast infection  Has patient had a PCN reaction causing immediate rash, facial/tongue/throat swelling, SOB or lightheadedness with hypotension: No Has patient had a PCN reaction causing severe rash involving mucus membranes or skin necrosis: No Has patient had a PCN reaction that required hospitalization No Has patient had a PCN reaction occurring within the last 10 years: No If all of the above answers are "NO", then may proceed with Cephalosporin use.   Arava [leflunomide] Other (See Comments)   hairloss    Doxycycline Itching, Other (See Comments)   Eyes red and tearing    Methotrexate Derivatives Other (See Comments)   The injectable causes skin lesions       Medication List    STOP taking these medications   aspirin EC 81 MG tablet Replaced by:  aspirin 325 MG tablet   rivaroxaban 10 MG Tabs tablet Commonly known as:  XARELTO     TAKE these medications   amLODipine 2.5 MG tablet Commonly known as:  NORVASC Take 2.5 mg by mouth daily.   aspirin 325 MG tablet Take 1 tablet (325 mg total) by mouth daily. Take a full dose 325 ng Aspirin daily for three weeks, then reduce back to and resume the 81 mg Aspirin daily at home. Replaces:  aspirin EC 81 MG tablet   iron polysaccharides 150 MG capsule Commonly known as:  NIFEREX Take 1 capsule (150 mg total) by mouth 2 (two) times daily.   methocarbamol 500 MG tablet Commonly known as:  ROBAXIN Take 1 tablet (500 mg total) by mouth every 6 (six) hours as needed for muscle spasms.   omeprazole 20 MG capsule Commonly known as:  PRILOSEC TAKE 1  CAPSULE(20 MG) BY MOUTH DAILY   oxyCODONE 5 MG immediate release tablet Commonly known as:  Oxy IR/ROXICODONE Take 1-2 tablets (5-10 mg total) by mouth every 4 (four) hours as needed for moderate pain or severe pain.   predniSONE 5 MG tablet Commonly known as:  DELTASONE Take 5 mg by mouth daily as needed (arthritis).   traMADol 50 MG tablet Commonly known as:  ULTRAM Take 1-2 tablets (50-100 mg total) by mouth every 6 (six) hours as needed for moderate pain.   VESICARE 10 MG tablet Generic drug:  solifenacin Take 10 mg by mouth daily.            Durable Medical Equipment        Start     Ordered   12/24/16 0930  For home use only DME Shower stool  Once     12/24/16 0929     Follow-up Information  Gearlean Alf, MD. Schedule an appointment as soon as possible for a visit on 01/05/2017.   Specialty:  Orthopedic Surgery Contact information: [address] [place] [address] [phone]           Signed: Arlee Muslim, PA-C Orthopaedic Surgery 12/24/2016, 9:43 AM

## 2016-12-24 NOTE — Evaluation (Signed)
Physical Therapy Evaluation Patient Details Name: Brittany Stark MRN: [id number] DOB: 1946-08-26 Today's Date: 12/24/2016   History of Present Illness  71 yo female s/p R TKA 11/02/16 and now with periprosthetic patellar fx.  Pt s/p R patellar tendon repair  12/23/16  Clinical Impression  Pt admitted as above and presenting with decreased functional mobility 2* post op pain, NO ROM R KNEE and poor condition of L knee.  Pt should progress to dc home with assist of fiance    Follow Up Recommendations Home health PT    Equipment Recommendations  None recommended by PT    Recommendations for Other Services       Precautions / Restrictions Precautions Precautions: Fall;Other (comment) Precaution Comments: NO ROM R KNEE Required Braces or Orthoses: Knee Immobilizer - Right Knee Immobilizer - Right: On at all times Restrictions Weight Bearing Restrictions: No Other Position/Activity Restrictions: WBAT WITH KI IN PLACE      Mobility  Bed Mobility Overal bed mobility: Needs Assistance Bed Mobility: Supine to Sit;Sit to Supine     Supine to sit: Min assist Sit to supine: Min assist   General bed mobility comments: Increased time and cues for sequence and use of leg lifter to self assist  Transfers Overall transfer level: Needs assistance Equipment used: Rolling walker (2 wheeled) Transfers: Sit to/from Stand Sit to Stand: Min assist;Mod assist;+2 safety/equipment;+2 physical assistance         General transfer comment: cues for LE management and use of UEs to self assist.  Physical assist to bring wt up and fwd and to balance in initial standing.  Ambulation/Gait Ambulation/Gait assistance: Min assist Ambulation Distance (Feet): 54 Feet Assistive device: Rolling walker (2 wheeled) Gait Pattern/deviations: Step-to pattern;Decreased step length - right;Decreased step length - left;Shuffle;Trunk flexed Gait velocity: decr Gait velocity interpretation: Below normal speed for  age/gender General Gait Details: Increased time with cues for seqence, posture and position from [employer]            Wheelchair Mobility    Modified Rankin (Stroke Patients Only)       Balance                                             Pertinent Vitals/Pain Pain Assessment: 0-10 Pain Score: 4  Pain Location: R knee Pain Descriptors / Indicators: Aching;Sore Pain Intervention(s): Limited activity within patient's tolerance;Monitored during session;Premedicated before session;Ice applied    Home Living Family/patient expects to be discharged to:: Private residence Living Arrangements: Spouse/significant other Available Help at Discharge: Family Type of Home: Apartment Home Access: Level entry     Home Layout: One level Home Equipment: Environmental consultant - 2 wheels;Cane - quad      Prior Function Level of Independence: Independent         Comments: pt states she had graduated to ambulation sans AD      Hand Dominance        Extremity/Trunk Assessment   Upper Extremity Assessment Upper Extremity Assessment: Generalized weakness (arthritic changes noted bil hands)    Lower Extremity Assessment Lower Extremity Assessment: RLE deficits/detail;LLE deficits/detail RLE Deficits / Details: KI in place RLE: Unable to fully assess due to immobilization LLE Deficits / Details: ROM WFL with strength 4/5 - pt states this knee will need replaced as well       Communication   Communication: No difficulties  Cognition Arousal/Alertness: Awake/alert Behavior During Therapy: WFL for tasks assessed/performed Overall Cognitive Status: Within Functional Limits for tasks assessed                                        General Comments      Exercises General Exercises - Lower Extremity Ankle Circles/Pumps: AROM;Both;15 reps;Supine   Assessment/Plan    PT Assessment Patient needs continued PT services  PT Problem List Decreased  strength;Decreased range of motion;Decreased activity tolerance;Decreased balance;Decreased mobility;Decreased knowledge of use of DME;Pain       PT Treatment Interventions DME instruction;Gait training;Functional mobility training;Therapeutic activities;Therapeutic exercise;Patient/family education    PT Goals (Current goals can be found in the Care Plan section)  Acute Rehab PT Goals Patient Stated Goal: Regain IND PT Goal Formulation: With patient Time For Goal Achievement: 12/26/16 Potential to Achieve Goals: Good    Frequency 7X/week   Barriers to discharge        Co-evaluation               End of Session Equipment Utilized During Treatment: Gait belt;Right knee immobilizer Activity Tolerance: Patient tolerated treatment well;Other (comment) (elevated anxiety) Patient left: in bed;with call bell/phone within reach;with family/visitor present Nurse Communication: Mobility status PT Visit Diagnosis: Unsteadiness on feet (R26.81);Difficulty in walking, not elsewhere classified (R26.2);Pain Pain - Right/Left: Right Pain - part of body: Knee    Time: 8882-8003 PT Time Calculation (min) (ACUTE ONLY): 42 min   Charges:   PT Evaluation $PT Eval Low Complexity: 1 Procedure PT Treatments $Gait Training: 8-22 mins $Therapeutic Activity: 8-22 mins   PT G Codes:   PT G-Codes **NOT FOR INPATIENT CLASS** Functional Assessment Tool Used: Clinical judgement Functional Limitation: Mobility: Walking and moving around Mobility: Walking and Moving Around Current Status (K9179): At least 40 percent but less than 60 percent impaired, limited or restricted Mobility: Walking and Moving Around Goal Status [phone]): At least 1 percent but less than 20 percent impaired, limited or restricted    Pg [phone]   Brittany Stark 12/24/2016, 12:10 PM

## 2016-12-24 NOTE — Care Management Obs Status (Signed)
[place] NOTIFICATION   Patient Details  Name: Brittany Stark MRN: [id number] Date of Birth: [birth year]   Medicare Observation Status Notification Given:  Yes    Guadalupe Maple, RN 12/24/2016, 10:37 AM

## 2016-12-24 NOTE — Op Note (Signed)
NAMEHAEDYN, BREAU NO.:  [id number]  MEDICAL RECORD NO.:  [id number]  LOCATION:                                 FACILITY:  PHYSICIAN:  Gaynelle Arabian, M.D.         DATE OF BIRTH:  DATE OF PROCEDURE:  12/23/2016 DATE OF DISCHARGE:                              OPERATIVE REPORT   PREOPERATIVE DIAGNOSIS:  Right periprosthetic patellar fracture.  POSTOPERATIVE DIAGNOSIS:  Right periprosthetic patellar fracture.  PROCEDURE:  Right patellar tendon repair.  SURGEON:  Gaynelle Arabian, M.D.  ASSISTANT:  Alexzandrew L. Perkins, PA-C.  ANESTHESIA:  Attempted spinal, then general.  ESTIMATED BLOOD LOSS:  Minimal.  DRAINS:  None.  TOURNIQUET TIME:  27 minutes at 300 mmHg.  COMPLICATIONS:  None.  CONDITION:  Stable to recovery.  BRIEF CLINICAL NOTE:  Ms. Geppert is a 71 year old female, who had a right total knee arthroplasty done approximately 2 months ago, was doing extremely well, but I felt a sudden pop in her right knee approximately 4 days ago.  She was initially seen in Urgent Care and told she did not have any injury.  She was subsequently seen in our office 2 days later and noted to have a periprosthetic patellar fracture.  She subsequently was taken to the operating room today for fixation versus excision of the inferior fragments and patellar tendon repair.  PROCEDURE IN DETAIL:  After attempted administration of spinal anesthetic, it was noted that the spinal was not taking and the patient subsequently had general anesthetic.  A tourniquet was then placed on her right thigh.  Right lower extremity prepped and draped in usual sterile fashion.  Previous midline incisions were utilized.  Skin cut with a 10 blade through subcutaneous tissue to the extensor mechanism. There is a transverse disruption in the extensor mechanism at the fracture site.  The fracture hematoma was irrigated and joint thoroughly irrigated with saline solution.  The fragments  inferiorly are tiny, but are left intact, so as not to thin the patellar tendon out too far.  We used #2 FiberWire sutures and did a medial and lateral Bunnell type suture starting distally in the tibial tuberosity and coursing up the patellar tendon.  I then drilled 3 holes in the patella and passed the sutures through the patella.  Reduced the tendon to the patella with the fragments intact and then tied the sutures together to effectively repair the tendon back to the remaining patella.  It was felt to be a stable repair.  The medial and lateral retinaculum were then repaired with interrupted #2 Ethibond sutures.  We thoroughly irrigated the subcu tissues then.  The tourniquet was released for a total time of 27 minutes.  Flexion against gravity was about 40 degrees.  It did not go beyond 40 so as not to overly tense the repair.  The subcu was then closed with interrupted 2-0 Vicryl and subcuticular running 4-0 Monocryl.  The incision was cleaned and dried, and Steri-Strips and a bulky sterile dressing applied.  She was then placed into a knee immobilizer, awakened, and transported to recovery in stable condition. Plan is knee immobilizer for 6 weeks with no  range of motion of the knee.     Gaynelle Arabian, M.D.     FA/MEDQ  D:  12/23/2016  T:  12/24/2016  Job:  [id number]

## 2016-12-24 NOTE — Discharge Instructions (Signed)
Pick up stool softner and laxative for home. Do not submerge incision under water. May shower after three days following surgery. Continue to use ice for pain and swelling from surgery.  No motion or bending of the right knee.  Knee Immobilizer in place at all times except for dressing changes and showering.

## 2016-12-24 NOTE — Progress Notes (Signed)
Discharge instructions discussed with patient and family, verbalized agreement and understanding 

## 2016-12-25 DIAGNOSIS — Z4789 Encounter for other orthopedic aftercare: Secondary | ICD-10-CM | POA: Diagnosis not present

## 2016-12-25 DIAGNOSIS — M19042 Primary osteoarthritis, left hand: Secondary | ICD-10-CM | POA: Diagnosis not present

## 2016-12-25 DIAGNOSIS — M978XXD Periprosthetic fracture around other internal prosthetic joint, subsequent encounter: Secondary | ICD-10-CM | POA: Diagnosis not present

## 2016-12-25 DIAGNOSIS — I10 Essential (primary) hypertension: Secondary | ICD-10-CM | POA: Diagnosis not present

## 2016-12-25 DIAGNOSIS — M069 Rheumatoid arthritis, unspecified: Secondary | ICD-10-CM | POA: Diagnosis not present

## 2016-12-25 DIAGNOSIS — M8448XD Pathological fracture, other site, subsequent encounter for fracture with routine healing: Secondary | ICD-10-CM | POA: Diagnosis not present

## 2016-12-26 DIAGNOSIS — M069 Rheumatoid arthritis, unspecified: Secondary | ICD-10-CM | POA: Diagnosis not present

## 2016-12-26 DIAGNOSIS — M8448XD Pathological fracture, other site, subsequent encounter for fracture with routine healing: Secondary | ICD-10-CM | POA: Diagnosis not present

## 2016-12-26 DIAGNOSIS — I10 Essential (primary) hypertension: Secondary | ICD-10-CM | POA: Diagnosis not present

## 2016-12-26 DIAGNOSIS — M19042 Primary osteoarthritis, left hand: Secondary | ICD-10-CM | POA: Diagnosis not present

## 2016-12-26 DIAGNOSIS — Z4789 Encounter for other orthopedic aftercare: Secondary | ICD-10-CM | POA: Diagnosis not present

## 2016-12-26 DIAGNOSIS — M978XXD Periprosthetic fracture around other internal prosthetic joint, subsequent encounter: Secondary | ICD-10-CM | POA: Diagnosis not present

## 2016-12-28 DIAGNOSIS — M19042 Primary osteoarthritis, left hand: Secondary | ICD-10-CM | POA: Diagnosis not present

## 2016-12-28 DIAGNOSIS — I10 Essential (primary) hypertension: Secondary | ICD-10-CM | POA: Diagnosis not present

## 2016-12-28 DIAGNOSIS — M8448XD Pathological fracture, other site, subsequent encounter for fracture with routine healing: Secondary | ICD-10-CM | POA: Diagnosis not present

## 2016-12-28 DIAGNOSIS — M069 Rheumatoid arthritis, unspecified: Secondary | ICD-10-CM | POA: Diagnosis not present

## 2016-12-28 DIAGNOSIS — M978XXD Periprosthetic fracture around other internal prosthetic joint, subsequent encounter: Secondary | ICD-10-CM | POA: Diagnosis not present

## 2016-12-28 DIAGNOSIS — Z4789 Encounter for other orthopedic aftercare: Secondary | ICD-10-CM | POA: Diagnosis not present

## 2016-12-30 DIAGNOSIS — I10 Essential (primary) hypertension: Secondary | ICD-10-CM | POA: Diagnosis not present

## 2016-12-30 DIAGNOSIS — M19042 Primary osteoarthritis, left hand: Secondary | ICD-10-CM | POA: Diagnosis not present

## 2016-12-30 DIAGNOSIS — M069 Rheumatoid arthritis, unspecified: Secondary | ICD-10-CM | POA: Diagnosis not present

## 2016-12-30 DIAGNOSIS — M8448XD Pathological fracture, other site, subsequent encounter for fracture with routine healing: Secondary | ICD-10-CM | POA: Diagnosis not present

## 2016-12-30 DIAGNOSIS — Z4789 Encounter for other orthopedic aftercare: Secondary | ICD-10-CM | POA: Diagnosis not present

## 2016-12-30 DIAGNOSIS — M978XXD Periprosthetic fracture around other internal prosthetic joint, subsequent encounter: Secondary | ICD-10-CM | POA: Diagnosis not present

## 2017-01-01 DIAGNOSIS — M19042 Primary osteoarthritis, left hand: Secondary | ICD-10-CM | POA: Diagnosis not present

## 2017-01-01 DIAGNOSIS — M069 Rheumatoid arthritis, unspecified: Secondary | ICD-10-CM | POA: Diagnosis not present

## 2017-01-01 DIAGNOSIS — M8448XD Pathological fracture, other site, subsequent encounter for fracture with routine healing: Secondary | ICD-10-CM | POA: Diagnosis not present

## 2017-01-01 DIAGNOSIS — I10 Essential (primary) hypertension: Secondary | ICD-10-CM | POA: Diagnosis not present

## 2017-01-01 DIAGNOSIS — Z4789 Encounter for other orthopedic aftercare: Secondary | ICD-10-CM | POA: Diagnosis not present

## 2017-01-01 DIAGNOSIS — M978XXD Periprosthetic fracture around other internal prosthetic joint, subsequent encounter: Secondary | ICD-10-CM | POA: Diagnosis not present

## 2017-01-04 DIAGNOSIS — Z4789 Encounter for other orthopedic aftercare: Secondary | ICD-10-CM | POA: Diagnosis not present

## 2017-01-04 DIAGNOSIS — M069 Rheumatoid arthritis, unspecified: Secondary | ICD-10-CM | POA: Diagnosis not present

## 2017-01-04 DIAGNOSIS — M19042 Primary osteoarthritis, left hand: Secondary | ICD-10-CM | POA: Diagnosis not present

## 2017-01-04 DIAGNOSIS — M8448XD Pathological fracture, other site, subsequent encounter for fracture with routine healing: Secondary | ICD-10-CM | POA: Diagnosis not present

## 2017-01-04 DIAGNOSIS — I10 Essential (primary) hypertension: Secondary | ICD-10-CM | POA: Diagnosis not present

## 2017-01-04 DIAGNOSIS — M978XXD Periprosthetic fracture around other internal prosthetic joint, subsequent encounter: Secondary | ICD-10-CM | POA: Diagnosis not present

## 2017-01-06 DIAGNOSIS — I10 Essential (primary) hypertension: Secondary | ICD-10-CM | POA: Diagnosis not present

## 2017-01-06 DIAGNOSIS — M978XXD Periprosthetic fracture around other internal prosthetic joint, subsequent encounter: Secondary | ICD-10-CM | POA: Diagnosis not present

## 2017-01-06 DIAGNOSIS — M19042 Primary osteoarthritis, left hand: Secondary | ICD-10-CM | POA: Diagnosis not present

## 2017-01-06 DIAGNOSIS — M069 Rheumatoid arthritis, unspecified: Secondary | ICD-10-CM | POA: Diagnosis not present

## 2017-01-06 DIAGNOSIS — Z4789 Encounter for other orthopedic aftercare: Secondary | ICD-10-CM | POA: Diagnosis not present

## 2017-01-06 DIAGNOSIS — M8448XD Pathological fracture, other site, subsequent encounter for fracture with routine healing: Secondary | ICD-10-CM | POA: Diagnosis not present

## 2017-01-11 DIAGNOSIS — Z4789 Encounter for other orthopedic aftercare: Secondary | ICD-10-CM | POA: Diagnosis not present

## 2017-01-11 DIAGNOSIS — I10 Essential (primary) hypertension: Secondary | ICD-10-CM | POA: Diagnosis not present

## 2017-01-11 DIAGNOSIS — M19042 Primary osteoarthritis, left hand: Secondary | ICD-10-CM | POA: Diagnosis not present

## 2017-01-11 DIAGNOSIS — M8448XD Pathological fracture, other site, subsequent encounter for fracture with routine healing: Secondary | ICD-10-CM | POA: Diagnosis not present

## 2017-01-11 DIAGNOSIS — M069 Rheumatoid arthritis, unspecified: Secondary | ICD-10-CM | POA: Diagnosis not present

## 2017-01-11 DIAGNOSIS — M978XXD Periprosthetic fracture around other internal prosthetic joint, subsequent encounter: Secondary | ICD-10-CM | POA: Diagnosis not present

## 2017-01-13 DIAGNOSIS — M1990 Unspecified osteoarthritis, unspecified site: Secondary | ICD-10-CM | POA: Diagnosis not present

## 2017-01-13 DIAGNOSIS — Z4789 Encounter for other orthopedic aftercare: Secondary | ICD-10-CM | POA: Diagnosis not present

## 2017-01-13 DIAGNOSIS — M059 Rheumatoid arthritis with rheumatoid factor, unspecified: Secondary | ICD-10-CM | POA: Diagnosis not present

## 2017-01-13 DIAGNOSIS — M810 Age-related osteoporosis without current pathological fracture: Secondary | ICD-10-CM | POA: Diagnosis not present

## 2017-01-13 DIAGNOSIS — I10 Essential (primary) hypertension: Secondary | ICD-10-CM | POA: Diagnosis not present

## 2017-01-13 DIAGNOSIS — M069 Rheumatoid arthritis, unspecified: Secondary | ICD-10-CM | POA: Diagnosis not present

## 2017-01-13 DIAGNOSIS — M25569 Pain in unspecified knee: Secondary | ICD-10-CM | POA: Diagnosis not present

## 2017-01-13 DIAGNOSIS — M19042 Primary osteoarthritis, left hand: Secondary | ICD-10-CM | POA: Diagnosis not present

## 2017-01-13 DIAGNOSIS — M8448XD Pathological fracture, other site, subsequent encounter for fracture with routine healing: Secondary | ICD-10-CM | POA: Diagnosis not present

## 2017-01-13 DIAGNOSIS — M978XXD Periprosthetic fracture around other internal prosthetic joint, subsequent encounter: Secondary | ICD-10-CM | POA: Diagnosis not present

## 2017-02-02 DIAGNOSIS — S82001D Unspecified fracture of right patella, subsequent encounter for closed fracture with routine healing: Secondary | ICD-10-CM | POA: Diagnosis not present

## 2017-02-12 NOTE — Addendum Note (Signed)
Addendum  created 02/12/17 1313 by Effie Berkshire, MD   Sign clinical note

## 2017-02-15 DIAGNOSIS — R600 Localized edema: Secondary | ICD-10-CM | POA: Diagnosis not present

## 2017-02-15 DIAGNOSIS — E782 Mixed hyperlipidemia: Secondary | ICD-10-CM | POA: Diagnosis not present

## 2017-02-15 DIAGNOSIS — I10 Essential (primary) hypertension: Secondary | ICD-10-CM | POA: Diagnosis not present

## 2017-02-15 DIAGNOSIS — Z131 Encounter for screening for diabetes mellitus: Secondary | ICD-10-CM | POA: Diagnosis not present

## 2017-02-19 NOTE — Addendum Note (Signed)
Addendum  created 02/19/17 1022 by Lyn Hollingshead, MD   Sign clinical note

## 2017-02-22 DIAGNOSIS — N390 Urinary tract infection, site not specified: Secondary | ICD-10-CM | POA: Diagnosis not present

## 2017-02-22 DIAGNOSIS — I10 Essential (primary) hypertension: Secondary | ICD-10-CM | POA: Diagnosis not present

## 2017-02-22 DIAGNOSIS — M059 Rheumatoid arthritis with rheumatoid factor, unspecified: Secondary | ICD-10-CM | POA: Diagnosis not present

## 2017-02-22 DIAGNOSIS — Z0000 Encounter for general adult medical examination without abnormal findings: Secondary | ICD-10-CM | POA: Diagnosis not present

## 2017-02-22 DIAGNOSIS — E782 Mixed hyperlipidemia: Secondary | ICD-10-CM | POA: Diagnosis not present

## 2017-03-23 DIAGNOSIS — S82001D Unspecified fracture of right patella, subsequent encounter for closed fracture with routine healing: Secondary | ICD-10-CM | POA: Diagnosis not present

## 2017-04-22 DIAGNOSIS — S82001D Unspecified fracture of right patella, subsequent encounter for closed fracture with routine healing: Secondary | ICD-10-CM | POA: Diagnosis not present

## 2017-06-17 DIAGNOSIS — E782 Mixed hyperlipidemia: Secondary | ICD-10-CM | POA: Diagnosis not present

## 2017-06-17 DIAGNOSIS — R600 Localized edema: Secondary | ICD-10-CM | POA: Diagnosis not present

## 2017-06-24 DIAGNOSIS — I10 Essential (primary) hypertension: Secondary | ICD-10-CM | POA: Diagnosis not present

## 2017-06-24 DIAGNOSIS — E782 Mixed hyperlipidemia: Secondary | ICD-10-CM | POA: Diagnosis not present

## 2017-06-24 DIAGNOSIS — Z23 Encounter for immunization: Secondary | ICD-10-CM | POA: Diagnosis not present

## 2017-07-20 DIAGNOSIS — M810 Age-related osteoporosis without current pathological fracture: Secondary | ICD-10-CM | POA: Diagnosis not present

## 2017-07-20 DIAGNOSIS — M25569 Pain in unspecified knee: Secondary | ICD-10-CM | POA: Diagnosis not present

## 2017-07-20 DIAGNOSIS — M059 Rheumatoid arthritis with rheumatoid factor, unspecified: Secondary | ICD-10-CM | POA: Diagnosis not present

## 2017-07-20 DIAGNOSIS — M1990 Unspecified osteoarthritis, unspecified site: Secondary | ICD-10-CM | POA: Diagnosis not present

## 2017-07-26 DIAGNOSIS — L03211 Cellulitis of face: Secondary | ICD-10-CM | POA: Diagnosis not present

## 2017-07-29 DIAGNOSIS — M1711 Unilateral primary osteoarthritis, right knee: Secondary | ICD-10-CM | POA: Diagnosis not present

## 2017-07-29 DIAGNOSIS — S82001D Unspecified fracture of right patella, subsequent encounter for closed fracture with routine healing: Secondary | ICD-10-CM | POA: Diagnosis not present

## 2017-07-30 LAB — HM DEXA SCAN

## 2017-08-02 DIAGNOSIS — M25361 Other instability, right knee: Secondary | ICD-10-CM | POA: Diagnosis not present

## 2017-08-04 DIAGNOSIS — M25361 Other instability, right knee: Secondary | ICD-10-CM | POA: Diagnosis not present

## 2017-08-04 DIAGNOSIS — R351 Nocturia: Secondary | ICD-10-CM | POA: Diagnosis not present

## 2017-08-04 DIAGNOSIS — N3941 Urge incontinence: Secondary | ICD-10-CM | POA: Diagnosis not present

## 2017-08-11 DIAGNOSIS — M25361 Other instability, right knee: Secondary | ICD-10-CM | POA: Diagnosis not present

## 2017-08-13 DIAGNOSIS — M25361 Other instability, right knee: Secondary | ICD-10-CM | POA: Diagnosis not present

## 2017-08-18 DIAGNOSIS — M25361 Other instability, right knee: Secondary | ICD-10-CM | POA: Diagnosis not present

## 2017-08-20 DIAGNOSIS — M25361 Other instability, right knee: Secondary | ICD-10-CM | POA: Diagnosis not present

## 2017-08-26 DIAGNOSIS — R1031 Right lower quadrant pain: Secondary | ICD-10-CM | POA: Diagnosis not present

## 2017-08-26 DIAGNOSIS — N390 Urinary tract infection, site not specified: Secondary | ICD-10-CM | POA: Diagnosis not present

## 2017-08-26 DIAGNOSIS — Z8719 Personal history of other diseases of the digestive system: Secondary | ICD-10-CM | POA: Diagnosis not present

## 2017-08-26 DIAGNOSIS — K5900 Constipation, unspecified: Secondary | ICD-10-CM | POA: Diagnosis not present

## 2017-08-26 DIAGNOSIS — R319 Hematuria, unspecified: Secondary | ICD-10-CM | POA: Diagnosis not present

## 2017-08-27 DIAGNOSIS — K5900 Constipation, unspecified: Secondary | ICD-10-CM | POA: Diagnosis not present

## 2017-08-27 DIAGNOSIS — R1031 Right lower quadrant pain: Secondary | ICD-10-CM | POA: Diagnosis not present

## 2017-08-27 DIAGNOSIS — Z8719 Personal history of other diseases of the digestive system: Secondary | ICD-10-CM | POA: Diagnosis not present

## 2017-08-27 DIAGNOSIS — N390 Urinary tract infection, site not specified: Secondary | ICD-10-CM | POA: Diagnosis not present

## 2017-08-27 DIAGNOSIS — R319 Hematuria, unspecified: Secondary | ICD-10-CM | POA: Diagnosis not present

## 2017-09-10 DIAGNOSIS — M25361 Other instability, right knee: Secondary | ICD-10-CM | POA: Diagnosis not present

## 2017-09-20 ENCOUNTER — Other Ambulatory Visit: Payer: Self-pay | Admitting: Internal Medicine

## 2017-09-20 DIAGNOSIS — Z1231 Encounter for screening mammogram for malignant neoplasm of breast: Secondary | ICD-10-CM

## 2017-09-24 DIAGNOSIS — I10 Essential (primary) hypertension: Secondary | ICD-10-CM | POA: Diagnosis not present

## 2017-09-24 DIAGNOSIS — E782 Mixed hyperlipidemia: Secondary | ICD-10-CM | POA: Diagnosis not present

## 2017-09-28 DIAGNOSIS — I10 Essential (primary) hypertension: Secondary | ICD-10-CM | POA: Diagnosis not present

## 2017-09-28 DIAGNOSIS — R1030 Lower abdominal pain, unspecified: Secondary | ICD-10-CM | POA: Diagnosis not present

## 2017-09-28 DIAGNOSIS — M1990 Unspecified osteoarthritis, unspecified site: Secondary | ICD-10-CM | POA: Diagnosis not present

## 2017-09-28 DIAGNOSIS — K458 Other specified abdominal hernia without obstruction or gangrene: Secondary | ICD-10-CM | POA: Diagnosis not present

## 2017-09-28 DIAGNOSIS — E782 Mixed hyperlipidemia: Secondary | ICD-10-CM | POA: Diagnosis not present

## 2017-09-30 DIAGNOSIS — K5730 Diverticulosis of large intestine without perforation or abscess without bleeding: Secondary | ICD-10-CM | POA: Diagnosis not present

## 2017-09-30 DIAGNOSIS — K640 First degree hemorrhoids: Secondary | ICD-10-CM | POA: Diagnosis not present

## 2017-09-30 DIAGNOSIS — Z86010 Personal history of colonic polyps: Secondary | ICD-10-CM | POA: Diagnosis not present

## 2017-09-30 LAB — HM COLONOSCOPY

## 2017-10-05 DIAGNOSIS — Z79899 Other long term (current) drug therapy: Secondary | ICD-10-CM | POA: Diagnosis not present

## 2017-10-05 DIAGNOSIS — M1990 Unspecified osteoarthritis, unspecified site: Secondary | ICD-10-CM | POA: Diagnosis not present

## 2017-10-05 DIAGNOSIS — M25569 Pain in unspecified knee: Secondary | ICD-10-CM | POA: Diagnosis not present

## 2017-10-05 DIAGNOSIS — M059 Rheumatoid arthritis with rheumatoid factor, unspecified: Secondary | ICD-10-CM | POA: Diagnosis not present

## 2017-10-05 DIAGNOSIS — M810 Age-related osteoporosis without current pathological fracture: Secondary | ICD-10-CM | POA: Diagnosis not present

## 2017-10-07 ENCOUNTER — Ambulatory Visit: Payer: Medicare HMO

## 2017-10-12 DIAGNOSIS — K458 Other specified abdominal hernia without obstruction or gangrene: Secondary | ICD-10-CM | POA: Diagnosis not present

## 2017-10-18 DIAGNOSIS — H04123 Dry eye syndrome of bilateral lacrimal glands: Secondary | ICD-10-CM | POA: Diagnosis not present

## 2017-10-18 DIAGNOSIS — H401134 Primary open-angle glaucoma, bilateral, indeterminate stage: Secondary | ICD-10-CM | POA: Diagnosis not present

## 2017-10-18 DIAGNOSIS — Z961 Presence of intraocular lens: Secondary | ICD-10-CM | POA: Diagnosis not present

## 2017-10-19 DIAGNOSIS — R040 Epistaxis: Secondary | ICD-10-CM | POA: Diagnosis not present

## 2017-11-02 DIAGNOSIS — H401122 Primary open-angle glaucoma, left eye, moderate stage: Secondary | ICD-10-CM | POA: Diagnosis not present

## 2017-11-02 DIAGNOSIS — H401113 Primary open-angle glaucoma, right eye, severe stage: Secondary | ICD-10-CM | POA: Diagnosis not present

## 2017-11-02 DIAGNOSIS — Z961 Presence of intraocular lens: Secondary | ICD-10-CM | POA: Diagnosis not present

## 2017-11-02 DIAGNOSIS — H04123 Dry eye syndrome of bilateral lacrimal glands: Secondary | ICD-10-CM | POA: Diagnosis not present

## 2017-11-09 ENCOUNTER — Other Ambulatory Visit: Payer: Self-pay | Admitting: Internal Medicine

## 2017-11-11 ENCOUNTER — Ambulatory Visit
Admission: RE | Admit: 2017-11-11 | Discharge: 2017-11-11 | Disposition: A | Discharge status: Home / Self Care | Payer: Medicare HMO | Source: Ambulatory Visit | Attending: Internal Medicine | Admitting: Internal Medicine

## 2017-11-11 DIAGNOSIS — Z1231 Encounter for screening mammogram for malignant neoplasm of breast: Secondary | ICD-10-CM | POA: Diagnosis not present

## 2017-11-12 ENCOUNTER — Other Ambulatory Visit: Payer: Self-pay | Admitting: Surgery

## 2017-11-12 DIAGNOSIS — R1013 Epigastric pain: Secondary | ICD-10-CM | POA: Diagnosis not present

## 2017-11-12 DIAGNOSIS — R1031 Right lower quadrant pain: Secondary | ICD-10-CM | POA: Diagnosis not present

## 2017-11-30 ENCOUNTER — Ambulatory Visit
Admission: RE | Admit: 2017-11-30 | Discharge: 2017-11-30 | Disposition: A | Discharge status: Home / Self Care | Payer: Medicare HMO | Source: Ambulatory Visit | Attending: Surgery | Admitting: Surgery

## 2017-11-30 DIAGNOSIS — K8020 Calculus of gallbladder without cholecystitis without obstruction: Secondary | ICD-10-CM | POA: Diagnosis not present

## 2017-11-30 DIAGNOSIS — R1031 Right lower quadrant pain: Secondary | ICD-10-CM

## 2017-11-30 MED ORDER — IOPAMIDOL (ISOVUE-300) INJECTION 61%
100.0000 mL | Freq: Once | INTRAVENOUS | Status: AC | PRN
  Administered 2017-11-30: 100 mL via INTRAVENOUS

## 2017-12-01 DIAGNOSIS — Z96651 Presence of right artificial knee joint: Secondary | ICD-10-CM | POA: Diagnosis not present

## 2017-12-01 DIAGNOSIS — M1712 Unilateral primary osteoarthritis, left knee: Secondary | ICD-10-CM | POA: Diagnosis not present

## 2017-12-14 DIAGNOSIS — H04123 Dry eye syndrome of bilateral lacrimal glands: Secondary | ICD-10-CM | POA: Diagnosis not present

## 2017-12-14 DIAGNOSIS — Z961 Presence of intraocular lens: Secondary | ICD-10-CM | POA: Diagnosis not present

## 2017-12-14 DIAGNOSIS — H401122 Primary open-angle glaucoma, left eye, moderate stage: Secondary | ICD-10-CM | POA: Diagnosis not present

## 2017-12-14 DIAGNOSIS — H401113 Primary open-angle glaucoma, right eye, severe stage: Secondary | ICD-10-CM | POA: Diagnosis not present

## 2017-12-28 DIAGNOSIS — R1030 Lower abdominal pain, unspecified: Secondary | ICD-10-CM | POA: Diagnosis not present

## 2017-12-28 DIAGNOSIS — I10 Essential (primary) hypertension: Secondary | ICD-10-CM | POA: Diagnosis not present

## 2017-12-28 DIAGNOSIS — E782 Mixed hyperlipidemia: Secondary | ICD-10-CM | POA: Diagnosis not present

## 2018-01-04 DIAGNOSIS — M059 Rheumatoid arthritis with rheumatoid factor, unspecified: Secondary | ICD-10-CM | POA: Diagnosis not present

## 2018-01-04 DIAGNOSIS — E782 Mixed hyperlipidemia: Secondary | ICD-10-CM | POA: Diagnosis not present

## 2018-01-04 DIAGNOSIS — I10 Essential (primary) hypertension: Secondary | ICD-10-CM | POA: Diagnosis not present

## 2018-01-25 DIAGNOSIS — Z79899 Other long term (current) drug therapy: Secondary | ICD-10-CM | POA: Diagnosis not present

## 2018-01-25 DIAGNOSIS — M059 Rheumatoid arthritis with rheumatoid factor, unspecified: Secondary | ICD-10-CM | POA: Diagnosis not present

## 2018-01-25 DIAGNOSIS — M25569 Pain in unspecified knee: Secondary | ICD-10-CM | POA: Diagnosis not present

## 2018-01-25 DIAGNOSIS — M1990 Unspecified osteoarthritis, unspecified site: Secondary | ICD-10-CM | POA: Diagnosis not present

## 2018-01-25 DIAGNOSIS — M810 Age-related osteoporosis without current pathological fracture: Secondary | ICD-10-CM | POA: Diagnosis not present

## 2018-02-11 DIAGNOSIS — M1712 Unilateral primary osteoarthritis, left knee: Secondary | ICD-10-CM | POA: Diagnosis not present

## 2018-02-11 DIAGNOSIS — M1711 Unilateral primary osteoarthritis, right knee: Secondary | ICD-10-CM | POA: Diagnosis not present

## 2018-02-14 DIAGNOSIS — R351 Nocturia: Secondary | ICD-10-CM | POA: Diagnosis not present

## 2018-02-14 DIAGNOSIS — R1032 Left lower quadrant pain: Secondary | ICD-10-CM | POA: Diagnosis not present

## 2018-02-14 DIAGNOSIS — N3941 Urge incontinence: Secondary | ICD-10-CM | POA: Diagnosis not present

## 2018-03-07 DIAGNOSIS — M810 Age-related osteoporosis without current pathological fracture: Secondary | ICD-10-CM | POA: Diagnosis not present

## 2018-03-07 DIAGNOSIS — Z79899 Other long term (current) drug therapy: Secondary | ICD-10-CM | POA: Diagnosis not present

## 2018-03-07 DIAGNOSIS — M25569 Pain in unspecified knee: Secondary | ICD-10-CM | POA: Diagnosis not present

## 2018-03-07 DIAGNOSIS — M059 Rheumatoid arthritis with rheumatoid factor, unspecified: Secondary | ICD-10-CM | POA: Diagnosis not present

## 2018-03-07 DIAGNOSIS — M1990 Unspecified osteoarthritis, unspecified site: Secondary | ICD-10-CM | POA: Diagnosis not present

## 2018-03-15 DIAGNOSIS — H401122 Primary open-angle glaucoma, left eye, moderate stage: Secondary | ICD-10-CM | POA: Diagnosis not present

## 2018-03-15 DIAGNOSIS — H401113 Primary open-angle glaucoma, right eye, severe stage: Secondary | ICD-10-CM | POA: Diagnosis not present

## 2018-03-15 DIAGNOSIS — Z961 Presence of intraocular lens: Secondary | ICD-10-CM | POA: Diagnosis not present

## 2018-03-15 DIAGNOSIS — H04123 Dry eye syndrome of bilateral lacrimal glands: Secondary | ICD-10-CM | POA: Diagnosis not present

## 2018-03-18 NOTE — H&P (Signed)
TOTAL KNEE ADMISSION H&P  Patient is being admitted for left total knee arthroplasty.  Subjective:  Chief Complaint:left knee pain.  HPI: Brittany Stark, 72 y.o. female, has a history of pain and functional disability in the left knee due to arthritis and has failed non-surgical conservative treatments for greater than 12 weeks to includeNSAID's and/or analgesics, corticosteriod injections, viscosupplementation injections and activity modification.  Onset of symptoms was gradual, starting 7 years ago with gradually worsening course since that time. The patient noted no past surgery on the left knee(s). Patient has pain that interferes with activities of daily living, joint swelling and and instability.  Patient has evidence of bone-on-bone at the lateral and patellofemoral compartments with valgus deformity that is approximately 8-10 degrees by imaging studies. There is no active infection.  Patient Active Problem List   Diagnosis Date Noted  . Peri-prosthetic patellar fracture 12/23/2016  . Patellar tendon rupture, right, subsequent encounter 12/23/2016  . OA (osteoarthritis) of knee 11/02/2016  . Hydronephrosis of right kidney 05/01/2014  . Cystitis 05/01/2014   Past Medical History:  Diagnosis Date  . Arthritis    rheumatoid and osteoarthritis   . Depression   . Elevated cholesterol    currently under control  . GERD (gastroesophageal reflux disease)    laryngopharyngeal reflux  . Hemorrhoids   . Hypertension     Past Surgical History:  Procedure Laterality Date  . ABDOMINAL HYSTERECTOMY    . BREAST SURGERY     breast reduction   . CATARACT EXTRACTION     both eyes  . COLONOSCOPY     hx polyps  . CYSTOSCOPY W/ RETROGRADES Right 05/01/2014   Procedure: CYSTOSCOPY WITH RIGHT RETROGRADE PYELOGRAM;  Surgeon: Malka So, MD;  Location: [facility];  Service: Urology;  Laterality: Right;  . CYSTOSCOPY WITH BIOPSY  05/01/2014   Procedure: CYSTOSCOPY WITH bladder BIOPSY;  Surgeon:  Malka So, MD;  Location: [facility];  Service: Urology;;  . KNEE ARTHROSCOPY  1995  . ORIF PATELLA Right 12/23/2016   Procedure: RIGHT PATELLA TENDON REPAIR;  Surgeon: Gaynelle Arabian, MD;  Location: [facility];  Service: Orthopedics;  Laterality: Right;  . right ankle surgery      has pins   . right arm surgery      pins in right arm   . TOTAL KNEE ARTHROPLASTY Right 11/02/2016   Procedure: RIGHT TOTAL KNEE ARTHROPLASTY;  Surgeon: Gaynelle Arabian, MD;  Location: [facility];  Service: Orthopedics;  Laterality: Right;  with abductor block    No current facility-administered medications for this encounter.    Current Outpatient Medications  Medication Sig Dispense Refill Last Dose  . amLODipine (NORVASC) 2.5 MG tablet Take 2.5 mg by mouth daily.  3 12/23/2016 at 0800  . aspirin 325 MG tablet Take 1 tablet (325 mg total) by mouth daily. Take a full dose 325 ng Aspirin daily for three weeks, then reduce back to and resume the 81 mg Aspirin daily at home. 21 tablet 0   . iron polysaccharides (NIFEREX) 150 MG capsule Take 1 capsule (150 mg total) by mouth 2 (two) times daily. (Patient not taking: Reported on 12/23/2016) 42 capsule 0 Completed Course at Unknown time  . methocarbamol (ROBAXIN) 500 MG tablet Take 1 tablet (500 mg total) by mouth every 6 (six) hours as needed for muscle spasms. 80 tablet 0   . omeprazole (PRILOSEC) 20 MG capsule TAKE 1 CAPSULE(20 MG) BY MOUTH DAILY 90 capsule 0 12/23/2016 at 0800  . oxyCODONE (OXY  IR/ROXICODONE) 5 MG immediate release tablet Take 1-2 tablets (5-10 mg total) by mouth every 4 (four) hours as needed for moderate pain or severe pain. 84 tablet 0   . predniSONE (DELTASONE) 5 MG tablet Take 5 mg by mouth daily as needed (arthritis).    12/22/2016 at Unknown time  . traMADol (ULTRAM) 50 MG tablet Take 1-2 tablets (50-100 mg total) by mouth every 6 (six) hours as needed for moderate pain. 56 tablet 0   . VESICARE 10 MG tablet Take 10 mg by mouth daily.  11 12/22/2016 at Unknown  time   Allergies  Allergen Reactions  . Amoxicillin Other (See Comments)    Yeast infection  Has patient had a PCN reaction causing immediate rash, facial/tongue/throat swelling, SOB or lightheadedness with hypotension: No Has patient had a PCN reaction causing severe rash involving mucus membranes or skin necrosis: No Has patient had a PCN reaction that required hospitalization No Has patient had a PCN reaction occurring within the last 10 years: No If all of the above answers are "NO", then may proceed with Cephalosporin use.   Jolee Ewing [Leflunomide] Other (See Comments)    hairloss   . Doxycycline Itching and Other (See Comments)    Eyes red and tearing   . Methotrexate Derivatives Other (See Comments)    The injectable causes skin lesions     Social History   Tobacco Use  . Smoking status: Never Smoker  . Smokeless tobacco: Never Used  Substance Use Topics  . Alcohol use: No    Alcohol/week: 0.0 oz    Family History  Problem Relation Age of Onset  . Breast cancer Maternal Aunt   . Heart disease Sister   . Breast cancer Mother   . Colon cancer Neg Hx   . Esophageal cancer Neg Hx   . Rectal cancer Neg Hx   . Stomach cancer Neg Hx      Review of Systems  Constitutional: Negative for chills and fever.  HENT: Negative for congestion, sore throat and tinnitus.   Eyes: Negative for double vision, photophobia and pain.  Respiratory: Negative for cough, shortness of breath and wheezing.   Cardiovascular: Negative for chest pain, palpitations and orthopnea.  Gastrointestinal: Negative for heartburn, nausea and vomiting.  Genitourinary: Negative for dysuria, frequency and urgency.  Musculoskeletal: Positive for joint pain.  Neurological: Negative for dizziness, weakness and headaches.  Psychiatric/Behavioral: Negative for depression.    Objective:  Physical Exam  Overweight and well developed. General: Alert and oriented x3, cooperative and pleasant, no acute  distress. Head: normocephalic, atraumatic, neck supple. Eyes: EOMI. Respiratory: breath sounds clear in all fields, no wheezing, rales, or rhonchi. Cardiovascular: Regular rate and rhythm, no murmurs, gallops or rubs.  Abdomen: non-tender to palpation and soft, normoactive bowel sounds. Musculoskeletal: Nonantalgic gait without using assisted devices.  Right Knee Exam:  No effusion. Range of motion is 0-125 degrees. No crepitus on range of motion of the knee. No medial or lateral joint line tenderness. Stable knee. Left Knee Exam:  No effusion. Progressive valgus deformity that has gotten much worse in the past 6 months. Range of motion is 5-125 degrees. Marked crepitus on range of motion of the knee. Tenderness is present in the joints lines, lateral greater than medial.Stable knee. Calves soft and nontender. Motor function intact in LE. Strength 5/5 LE bilaterally. Neuro: Distal pulses 2+. Sensation to light touch intact in LE.  Vital signs in last 24 hours: Blood pressure: 116/84 mmHg Pulse: 72 bpm  Labs:   Estimated body mass index is 26.94 kg/m as calculated from the following:   Height as of 12/23/16: 5\' 7"  (1.702 m).   Weight as of 12/23/16: 78 kg (172 lb).   Imaging Review Plain radiographs demonstrate severe degenerative joint disease of the left knee(s). The overall alignment is significant valgus. The bone quality appears to be adequate for age and reported activity level.   Preoperative templating of the joint replacement has been completed, documented, and submitted to the Operating Room personnel in order to optimize intra-operative equipment management.   Anticipated LOS equal to or greater than 2 midnights due to - Age 67 and older with one or more of the following:  - Obesity  - Expected need for hospital services (PT, OT, Nursing) required for safe  discharge  - Anticipated need for postoperative skilled nursing care or inpatient rehab  - Active co-morbidities:  None OR   - Unanticipated findings during/Post Surgery: None  - Patient is a high risk of re-admission due to: None     Assessment/Plan:  End stage arthritis, left knee   The patient history, physical examination, clinical judgment of the provider and imaging studies are consistent with end stage degenerative joint disease of the left knee(s) and total knee arthroplasty is deemed medically necessary. The treatment options including medical management, injection therapy arthroscopy and arthroplasty were discussed at length. The risks and benefits of total knee arthroplasty were presented and reviewed. The risks due to aseptic loosening, infection, stiffness, patella tracking problems, thromboembolic complications and other imponderables were discussed. The patient acknowledged the explanation, agreed to proceed with the plan and consent was signed. Patient is being admitted for inpatient treatment for surgery, pain control, PT, OT, prophylactic antibiotics, VTE prophylaxis, progressive ambulation and ADL's and discharge planning. The patient is planning to be discharged home with outpatient physical therapy.   Therapy Plans: outpatient therapy at [facility] Disposition: Home with fiance Planned DVT Prophylaxis: aspirin 325 mg BID DME needed: None PCP: Dr. Alphonsus Sias TXA: IV Allergies: Amoxicillin, doxycycline Other: Pt has not been medically cleared for surgery, clearance form provided to patient to take to Dr. Alphonsus Sias.   - Patient was instructed on what medications to stop prior to surgery. - Follow-up visit in 2 weeks with Dr. Wynelle Link - Begin physical therapy following surgery - Pre-operative lab work as pre-surgical testing - Prescriptions will be provided in hospital at time of discharge  Theresa Duty, PA-C Orthopedic Surgery [facility] [place]

## 2018-03-23 DIAGNOSIS — Z01818 Encounter for other preprocedural examination: Secondary | ICD-10-CM | POA: Diagnosis not present

## 2018-03-28 DIAGNOSIS — Z01818 Encounter for other preprocedural examination: Secondary | ICD-10-CM | POA: Diagnosis not present

## 2018-03-28 DIAGNOSIS — I10 Essential (primary) hypertension: Secondary | ICD-10-CM | POA: Diagnosis not present

## 2018-03-28 DIAGNOSIS — E782 Mixed hyperlipidemia: Secondary | ICD-10-CM | POA: Diagnosis not present

## 2018-03-28 DIAGNOSIS — M059 Rheumatoid arthritis with rheumatoid factor, unspecified: Secondary | ICD-10-CM | POA: Diagnosis not present

## 2018-04-04 NOTE — Patient Instructions (Addendum)
Brittany Stark  04/04/2018   Your procedure is scheduled on: 04-11-18   Report to [facility] Main  Entrance    Report to admitting at 9:00AM    Call this number if you have problems the morning of surgery [phone]     Remember: Do not eat food or drink liquids :After Midnight.     Take these medicines the morning of surgery with A SIP OF WATER:                                 You may not have any metal on your body including hair pins and              piercings  Do not wear jewelry, make-up, lotions, powders or perfumes, deodorant             Do not wear nail polish.  Do not shave  48 hours prior to surgery.     Do not bring valuables to the hospital. Brittany Stark.  Contacts, dentures or bridgework may not be worn into surgery.  Leave suitcase in the car. After surgery it may be brought to your room.                Please read over the following fact sheets you were given: _____________________________________________________________________             [facility] - Preparing for Surgery Before surgery, you can play an important role.  Because skin is not sterile, your skin needs to be as free of germs as possible.  You can reduce the number of germs on your skin by washing with CHG (chlorahexidine gluconate) soap before surgery.  CHG is an antiseptic cleaner which kills germs and bonds with the skin to continue killing germs even after washing. Please DO NOT use if you have an allergy to CHG or antibacterial soaps.  If your skin becomes reddened/irritated stop using the CHG and inform your nurse when you arrive at Short Stay. Do not shave (including legs and underarms) for at least 48 hours prior to the first CHG shower.  You may shave your face/neck. Please follow these instructions carefully:  1.  Shower with CHG Soap the night before surgery and the  morning of Surgery.  2.  If you choose to wash  your hair, wash your hair first as usual with your  normal  shampoo.  3.  After you shampoo, rinse your hair and body thoroughly to remove the  shampoo.                           4.  Use CHG as you would any other liquid soap.  You can apply chg directly  to the skin and wash                       Gently with a scrungie or clean washcloth.  5.  Apply the CHG Soap to your body ONLY FROM THE NECK DOWN.   Do not use on face/ open  Wound or open sores. Avoid contact with eyes, ears mouth and genitals (private parts).                       Wash face,  Genitals (private parts) with your normal soap.             6.  Wash thoroughly, paying special attention to the area where your surgery  will be performed.  7.  Thoroughly rinse your body with warm water from the neck down.  8.  DO NOT shower/wash with your normal soap after using and rinsing off  the CHG Soap.                9.  Pat yourself dry with a clean towel.            10.  Wear clean pajamas.            11.  Place clean sheets on your bed the night of your first shower and do not  sleep with pets. Day of Surgery : Do not apply any lotions/deodorants the morning of surgery.  Please wear clean clothes to the hospital/surgery center.  FAILURE TO FOLLOW THESE INSTRUCTIONS MAY RESULT IN THE CANCELLATION OF YOUR SURGERY PATIENT SIGNATURE_________________________________  NURSE SIGNATURE__________________________________  ________________________________________________________________________   Brittany Stark  An incentive spirometer is a tool that can help keep your lungs clear and active. This tool measures how well you are filling your lungs with each breath. Taking long deep breaths may help reverse or decrease the chance of developing breathing (pulmonary) problems (especially infection) following:  A long period of time when you are unable to move or be active. BEFORE THE PROCEDURE   If the spirometer  includes an indicator to show your best effort, your nurse or respiratory therapist will set it to a desired goal.  If possible, sit up straight or lean slightly forward. Try not to slouch.  Hold the incentive spirometer in an upright position. INSTRUCTIONS FOR USE  1. Sit on the edge of your bed if possible, or sit up as far as you can in bed or on a chair. 2. Hold the incentive spirometer in an upright position. 3. Breathe out normally. 4. Place the mouthpiece in your mouth and seal your lips tightly around it. 5. Breathe in slowly and as deeply as possible, raising the piston or the ball toward the top of the column. 6. Hold your breath for 3-5 seconds or for as long as possible. Allow the piston or ball to fall to the bottom of the column. 7. Remove the mouthpiece from your mouth and breathe out normally. 8. Rest for a few seconds and repeat Steps 1 through 7 at least 10 times every 1-2 hours when you are awake. Take your time and take a few normal breaths between deep breaths. 9. The spirometer may include an indicator to show your best effort. Use the indicator as a goal to work toward during each repetition. 10. After each set of 10 deep breaths, practice coughing to be sure your lungs are clear. If you have an incision (the cut made at the time of surgery), support your incision when coughing by placing a pillow or rolled up towels firmly against it. Once you are able to get out of bed, walk around indoors and cough well. You may stop using the incentive spirometer when instructed by your caregiver.  RISKS AND COMPLICATIONS  Take your time so you do not get  dizzy or light-headed.  If you are in pain, you may need to take or ask for pain medication before doing incentive spirometry. It is harder to take a deep breath if you are having pain. AFTER USE  Rest and breathe slowly and easily.  It can be helpful to keep track of a log of your progress. Your caregiver can provide you with a  simple table to help with this. If you are using the spirometer at home, follow these instructions: Brittany Stark IF:   You are having difficultly using the spirometer.  You have trouble using the spirometer as often as instructed.  Your pain medication is not giving enough relief while using the spirometer.  You develop fever of 100.5 F (38.1 C) or higher. SEEK IMMEDIATE MEDICAL CARE IF:   You cough up bloody sputum that had not been present before.  You develop fever of 102 F (38.9 C) or greater.  You develop worsening pain at or near the incision site. MAKE SURE YOU:   Understand these instructions.  Will watch your condition.  Will get help right away if you are not doing well or get worse. Document Released: 01/11/2007 Document Revised: 11/23/2011 Document Reviewed: 03/14/2007 Exit[employer] Patient Information 2014 [employer], [place].   ________________________________________________________________________  WHAT IS A BLOOD TRANSFUSION? Blood Transfusion Information  A transfusion is the replacement of blood or some of its parts. Blood is made up of multiple cells which provide different functions.  Red blood cells carry oxygen and are used for blood loss replacement.  White blood cells fight against infection.  Platelets control bleeding.  Plasma helps clot blood.  Other blood products are available for specialized needs, such as hemophilia or other clotting disorders. BEFORE THE TRANSFUSION  Who gives blood for transfusions?   Healthy volunteers who are fully evaluated to make sure their blood is safe. This is blood bank blood. Transfusion therapy is the safest it has ever been in the practice of medicine. Before blood is taken from a donor, a complete history is taken to make sure that person has no history of diseases nor engages in risky social behavior (examples are intravenous drug use or sexual activity with multiple partners). The donor's travel history  is screened to minimize risk of transmitting infections, such as malaria. The donated blood is tested for signs of infectious diseases, such as HIV and hepatitis. The blood is then tested to be sure it is compatible with you in order to minimize the chance of a transfusion reaction. If you or a relative donates blood, this is often done in anticipation of surgery and is not appropriate for emergency situations. It takes many days to process the donated blood. RISKS AND COMPLICATIONS Although transfusion therapy is very safe and saves many lives, the main dangers of transfusion include:   Getting an infectious disease.  Developing a transfusion reaction. This is an allergic reaction to something in the blood you were given. Every precaution is taken to prevent this. The decision to have a blood transfusion has been considered carefully by your caregiver before blood is given. Blood is not given unless the benefits outweigh the risks. AFTER THE TRANSFUSION  Right after receiving a blood transfusion, you will usually feel much better and more energetic. This is especially true if your red blood cells have gotten low (anemic). The transfusion raises the level of the red blood cells which carry oxygen, and this usually causes an energy increase.  The nurse administering the transfusion will  monitor you carefully for complications. HOME CARE INSTRUCTIONS  No special instructions are needed after a transfusion. You may find your energy is better. Speak with your caregiver about any limitations on activity for underlying diseases you may have. SEEK MEDICAL CARE IF:   Your condition is not improving after your transfusion.  You develop redness or irritation at the intravenous (IV) site. SEEK IMMEDIATE MEDICAL CARE IF:  Any of the following symptoms occur over the next 12 hours:  Shaking chills.  You have a temperature by mouth above 102 F (38.9 C), not controlled by medicine.  Chest, back, or  muscle pain.  People around you feel you are not acting correctly or are confused.  Shortness of breath or difficulty breathing.  Dizziness and fainting.  You get a rash or develop hives.  You have a decrease in urine output.  Your urine turns a dark color or changes to pink, red, or brown. Any of the following symptoms occur over the next 10 days:  You have a temperature by mouth above 102 F (38.9 C), not controlled by medicine.  Shortness of breath.  Weakness after normal activity.  The white part of the eye turns yellow (jaundice).  You have a decrease in the amount of urine or are urinating less often.  Your urine turns a dark color or changes to pink, red, or brown. Document Released: 08/28/2000 Document Revised: 11/23/2011 Document Reviewed: 04/16/2008 [facility] Patient Information 2014 [place], [place].  _______________________________________________________________________

## 2018-04-05 ENCOUNTER — Encounter (HOSPITAL_COMMUNITY): Payer: Self-pay

## 2018-04-05 ENCOUNTER — Other Ambulatory Visit: Payer: Self-pay

## 2018-04-05 ENCOUNTER — Encounter (HOSPITAL_COMMUNITY)
Admission: RE | Admit: 2018-04-05 | Discharge: 2018-04-05 | Disposition: A | Discharge status: Home / Self Care | Payer: Medicare HMO | Source: Ambulatory Visit | Attending: Orthopedic Surgery | Admitting: Orthopedic Surgery

## 2018-04-05 DIAGNOSIS — Z7952 Long term (current) use of systemic steroids: Secondary | ICD-10-CM | POA: Diagnosis not present

## 2018-04-05 DIAGNOSIS — M1712 Unilateral primary osteoarthritis, left knee: Secondary | ICD-10-CM | POA: Insufficient documentation

## 2018-04-05 DIAGNOSIS — Z01810 Encounter for preprocedural cardiovascular examination: Secondary | ICD-10-CM | POA: Insufficient documentation

## 2018-04-05 DIAGNOSIS — I119 Hypertensive heart disease without heart failure: Secondary | ICD-10-CM | POA: Insufficient documentation

## 2018-04-05 DIAGNOSIS — Z7982 Long term (current) use of aspirin: Secondary | ICD-10-CM | POA: Diagnosis not present

## 2018-04-05 DIAGNOSIS — M069 Rheumatoid arthritis, unspecified: Secondary | ICD-10-CM | POA: Diagnosis not present

## 2018-04-05 DIAGNOSIS — F329 Major depressive disorder, single episode, unspecified: Secondary | ICD-10-CM | POA: Insufficient documentation

## 2018-04-05 DIAGNOSIS — K219 Gastro-esophageal reflux disease without esophagitis: Secondary | ICD-10-CM | POA: Insufficient documentation

## 2018-04-05 DIAGNOSIS — Z79899 Other long term (current) drug therapy: Secondary | ICD-10-CM | POA: Insufficient documentation

## 2018-04-05 DIAGNOSIS — Z96651 Presence of right artificial knee joint: Secondary | ICD-10-CM | POA: Diagnosis not present

## 2018-04-05 DIAGNOSIS — Z881 Allergy status to other antibiotic agents status: Secondary | ICD-10-CM | POA: Diagnosis not present

## 2018-04-05 DIAGNOSIS — E7800 Pure hypercholesterolemia, unspecified: Secondary | ICD-10-CM | POA: Insufficient documentation

## 2018-04-05 DIAGNOSIS — Z01818 Encounter for other preprocedural examination: Secondary | ICD-10-CM | POA: Insufficient documentation

## 2018-04-05 DIAGNOSIS — Z888 Allergy status to other drugs, medicaments and biological substances status: Secondary | ICD-10-CM | POA: Insufficient documentation

## 2018-04-05 HISTORY — DX: Personal history of urinary calculi: Z87.442

## 2018-04-05 LAB — URINALYSIS, MICROSCOPIC (REFLEX)

## 2018-04-05 LAB — PROTIME-INR
INR: 0.95
Prothrombin Time: 12.6 seconds (ref 11.4–15.2)

## 2018-04-05 LAB — URINALYSIS, ROUTINE W REFLEX MICROSCOPIC
BILIRUBIN URINE: NEGATIVE
Glucose, UA: NEGATIVE mg/dL
Ketones, ur: NEGATIVE mg/dL
Leukocytes, UA: NEGATIVE
Nitrite: NEGATIVE
PROTEIN: NEGATIVE mg/dL
Specific Gravity, Urine: 1.01 (ref 1.005–1.030)
pH: 7.5 (ref 5.0–8.0)

## 2018-04-05 LAB — CBC
HCT: 33.8 % — ABNORMAL LOW (ref 36.0–46.0)
HEMOGLOBIN: 10.7 g/dL — AB (ref 12.0–15.0)
MCH: 30.8 pg (ref 26.0–34.0)
MCHC: 31.7 g/dL (ref 30.0–36.0)
MCV: 97.4 fL (ref 78.0–100.0)
Platelets: 235 10*3/uL (ref 150–400)
RBC: 3.47 MIL/uL — AB (ref 3.87–5.11)
RDW: 15.5 % (ref 11.5–15.5)
WBC: 3.4 10*3/uL — AB (ref 4.0–10.5)

## 2018-04-05 LAB — COMPREHENSIVE METABOLIC PANEL
ALBUMIN: 3.4 g/dL — AB (ref 3.5–5.0)
ALK PHOS: 91 U/L (ref 38–126)
ALT: 21 U/L (ref 0–44)
AST: 25 U/L (ref 15–41)
Anion gap: 6 (ref 5–15)
BUN: 11 mg/dL (ref 8–23)
CALCIUM: 8.9 mg/dL (ref 8.9–10.3)
CO2: 30 mmol/L (ref 22–32)
Chloride: 109 mmol/L (ref 98–111)
Creatinine, Ser: 0.73 mg/dL (ref 0.44–1.00)
GFR calc Af Amer: >60 mL/min (ref >60)
GFR calc non Af Amer: >60 mL/min (ref >60)
GLUCOSE: 89 mg/dL (ref 70–99)
Potassium: 3.2 mmol/L — ABNORMAL LOW (ref 3.5–5.1)
SODIUM: 145 mmol/L (ref 135–145)
Total Bilirubin: 0.7 mg/dL (ref 0.3–1.2)
Total Protein: 7.8 g/dL (ref 6.5–8.1)

## 2018-04-05 LAB — SURGICAL PCR SCREEN
MRSA, PCR: NEGATIVE
STAPHYLOCOCCUS AUREUS: POSITIVE — AB

## 2018-04-05 LAB — APTT: APTT: 29 s (ref 24–36)

## 2018-04-10 IMAGING — MG 2D DIGITAL SCREENING BILATERAL MAMMOGRAM WITH CAD AND ADJUNCT TO
8 of 12 series · 8 of 28 positions shown · non-contrast
Comparison: Previous exam(s).

CLINICAL DATA: Screening.

EXAM:
2D DIGITAL SCREENING BILATERAL MAMMOGRAM WITH CAD AND ADJUNCT TOMO

[R MLO]
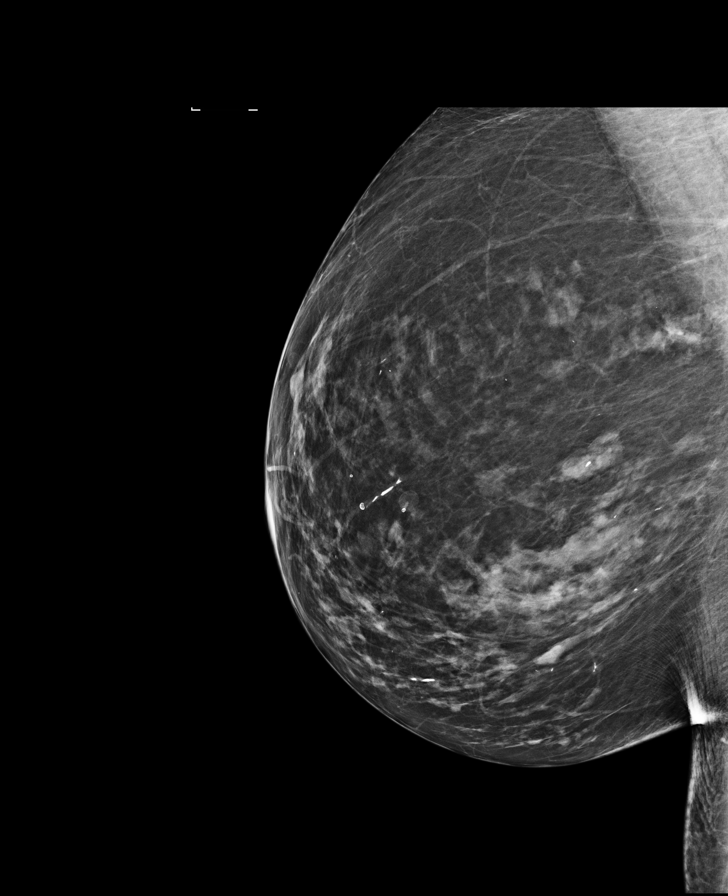

[R MLO synth-2D]
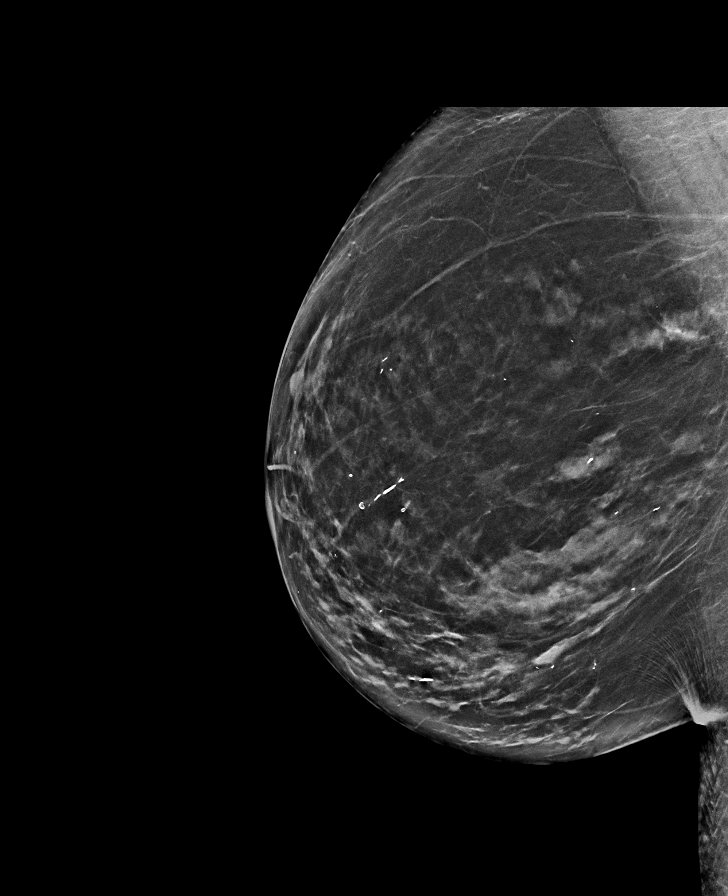

[L MLO synth-2D]
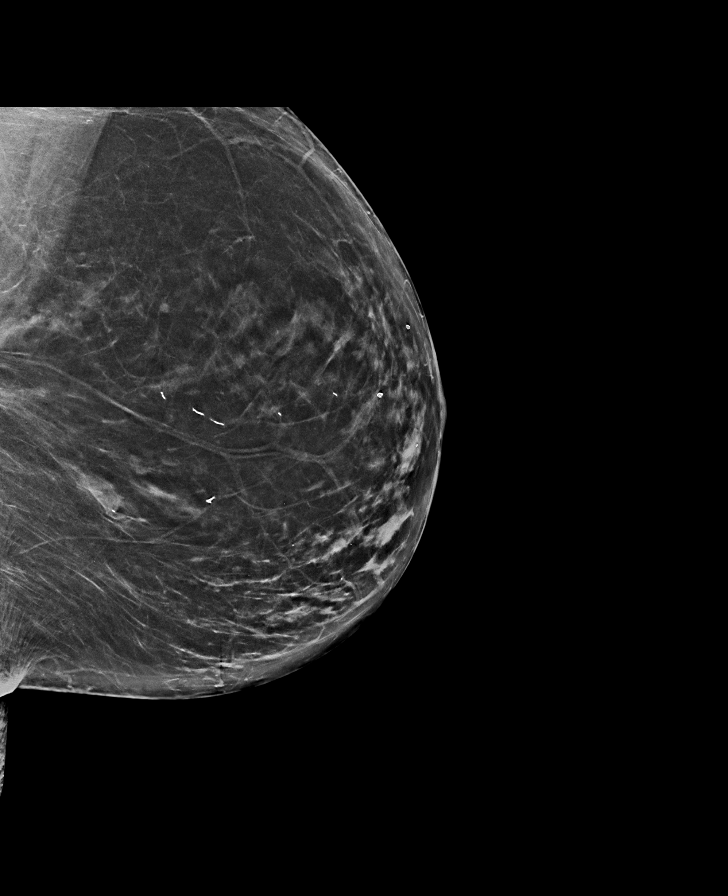

[L CC synth-2D]
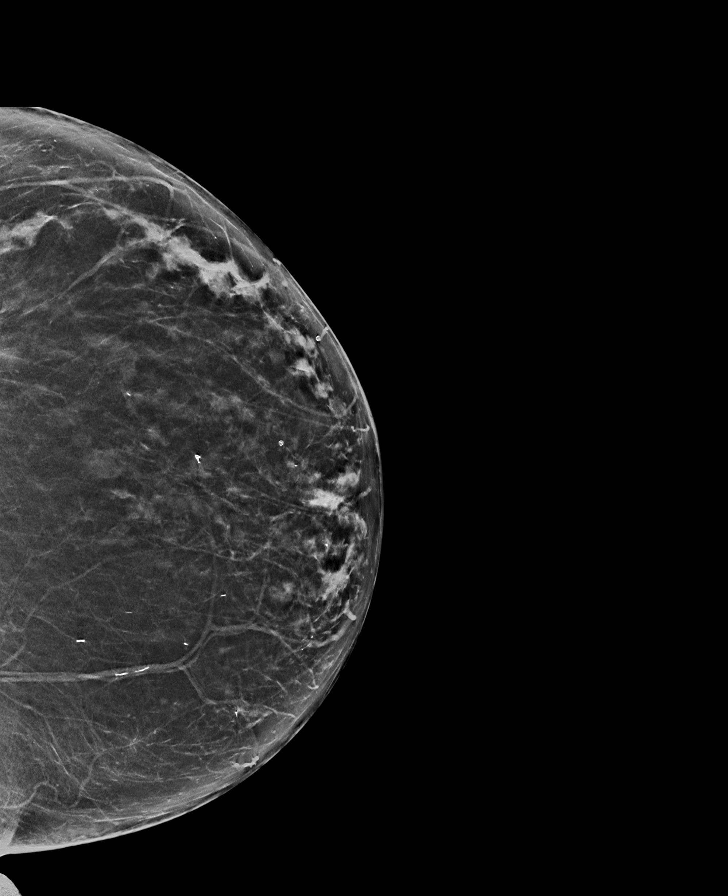

[L MLO]
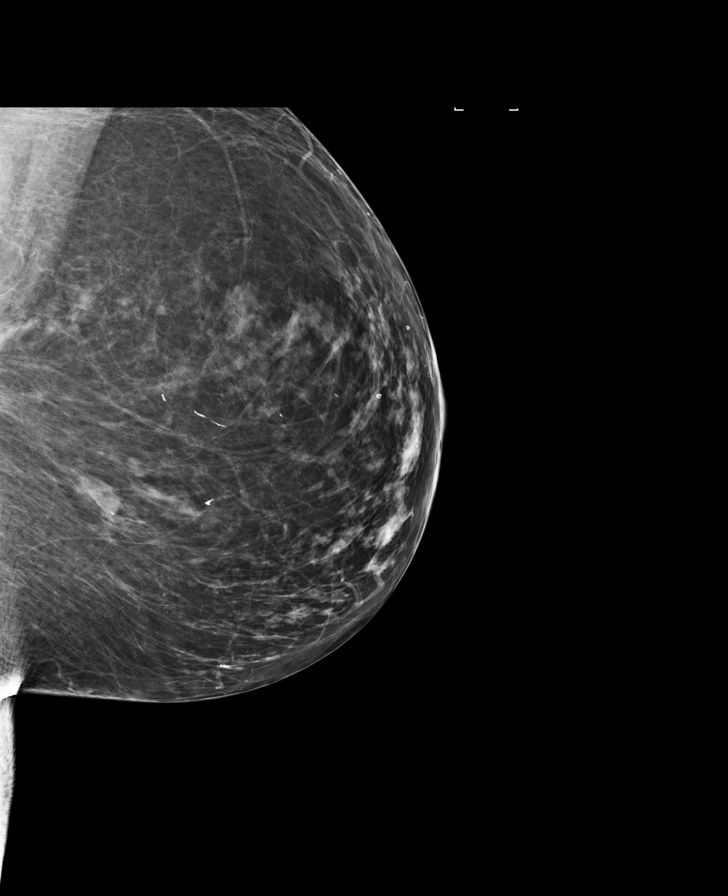

[L CC]
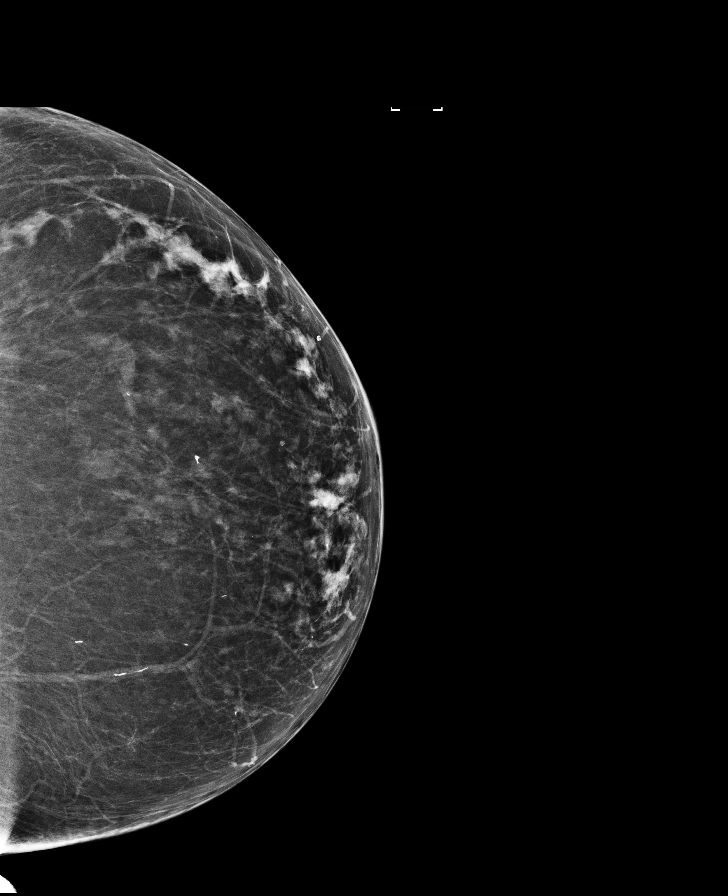

[R CC synth-2D]
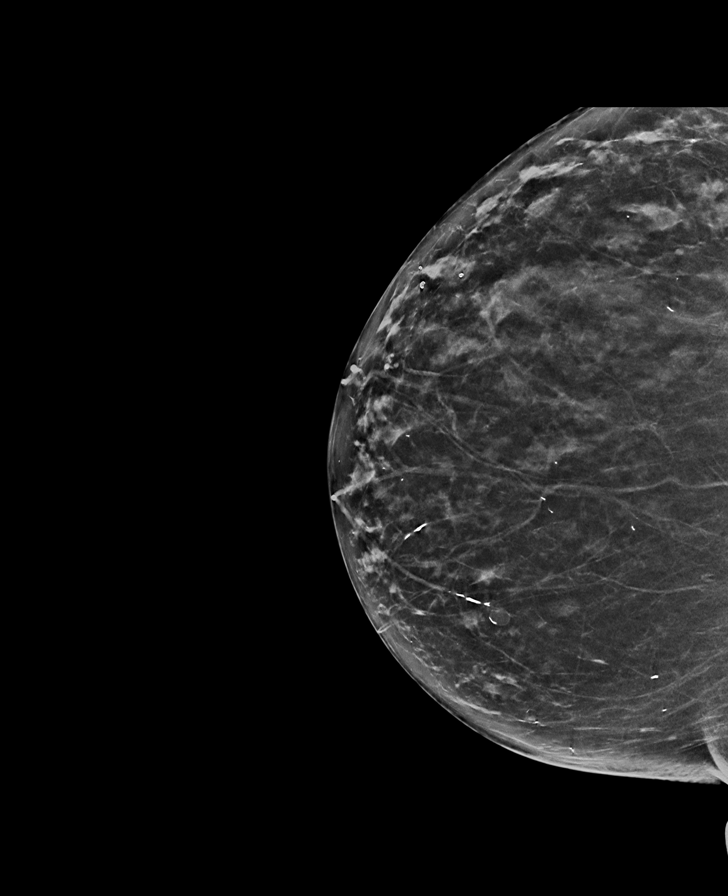

[R CC]
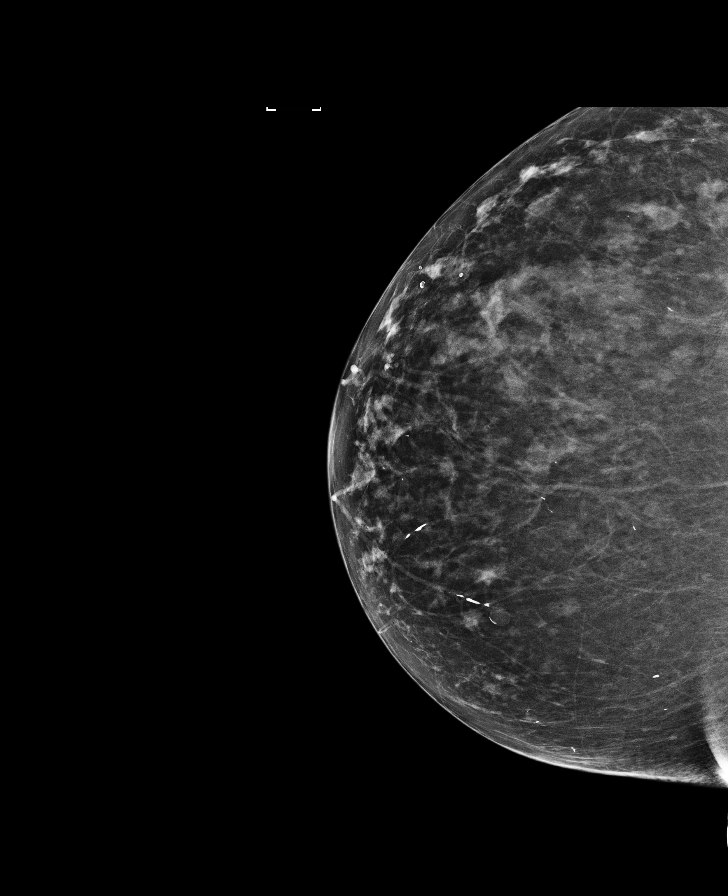

[8 of 28 positions shown; findings below may reference images not displayed]

ACR Breast Density Category c: The breast tissue is heterogeneously
dense, which may obscure small masses.
FINDINGS: There are no findings suspicious for malignancy. Images were
processed with CAD.
IMPRESSION: No mammographic evidence of malignancy. A result letter of this
screening mammogram will be mailed directly to the patient.

RECOMMENDATION:
Screening mammogram in one year. (Code:[id number])

BI-RADS CATEGORY  1: Negative.

## 2018-04-10 MED ORDER — BUPIVACAINE LIPOSOME 1.3 % IJ SUSP
20.0000 mL | INTRAMUSCULAR | Status: DC
  Filled 2018-04-10: qty 20

## 2018-04-11 ENCOUNTER — Encounter (HOSPITAL_COMMUNITY): Payer: Self-pay | Admitting: *Deleted

## 2018-04-11 ENCOUNTER — Inpatient Hospital Stay (HOSPITAL_COMMUNITY): Payer: Medicare HMO | Admitting: Anesthesiology

## 2018-04-11 ENCOUNTER — Inpatient Hospital Stay (HOSPITAL_COMMUNITY)
Admission: RE | Admit: 2018-04-11 | Discharge: 2018-04-12 | DRG: 470 | Disposition: A | Discharge status: Home / Self Care | Payer: Medicare HMO | Attending: Orthopedic Surgery | Admitting: Orthopedic Surgery

## 2018-04-11 ENCOUNTER — Encounter (HOSPITAL_COMMUNITY)
Admission: RE | Disposition: A | Discharge status: Home / Self Care | Payer: Self-pay | Source: Home / Self Care | Attending: Orthopedic Surgery

## 2018-04-11 ENCOUNTER — Other Ambulatory Visit: Payer: Self-pay

## 2018-04-11 DIAGNOSIS — M1712 Unilateral primary osteoarthritis, left knee: Secondary | ICD-10-CM | POA: Diagnosis not present

## 2018-04-11 DIAGNOSIS — Z96651 Presence of right artificial knee joint: Secondary | ICD-10-CM | POA: Diagnosis present

## 2018-04-11 DIAGNOSIS — Z7982 Long term (current) use of aspirin: Secondary | ICD-10-CM | POA: Diagnosis not present

## 2018-04-11 DIAGNOSIS — Z6826 Body mass index (BMI) 26.0-26.9, adult: Secondary | ICD-10-CM

## 2018-04-11 DIAGNOSIS — E7800 Pure hypercholesterolemia, unspecified: Secondary | ICD-10-CM | POA: Diagnosis not present

## 2018-04-11 DIAGNOSIS — Z9841 Cataract extraction status, right eye: Secondary | ICD-10-CM

## 2018-04-11 DIAGNOSIS — E669 Obesity, unspecified: Secondary | ICD-10-CM | POA: Diagnosis present

## 2018-04-11 DIAGNOSIS — Z90710 Acquired absence of both cervix and uterus: Secondary | ICD-10-CM | POA: Diagnosis not present

## 2018-04-11 DIAGNOSIS — Z87442 Personal history of urinary calculi: Secondary | ICD-10-CM | POA: Diagnosis not present

## 2018-04-11 DIAGNOSIS — Z79899 Other long term (current) drug therapy: Secondary | ICD-10-CM

## 2018-04-11 DIAGNOSIS — Z7952 Long term (current) use of systemic steroids: Secondary | ICD-10-CM

## 2018-04-11 DIAGNOSIS — M1710 Unilateral primary osteoarthritis, unspecified knee: Secondary | ICD-10-CM | POA: Diagnosis present

## 2018-04-11 DIAGNOSIS — G8918 Other acute postprocedural pain: Secondary | ICD-10-CM | POA: Diagnosis not present

## 2018-04-11 DIAGNOSIS — N1330 Unspecified hydronephrosis: Secondary | ICD-10-CM | POA: Diagnosis not present

## 2018-04-11 DIAGNOSIS — Z888 Allergy status to other drugs, medicaments and biological substances status: Secondary | ICD-10-CM

## 2018-04-11 DIAGNOSIS — Z8249 Family history of ischemic heart disease and other diseases of the circulatory system: Secondary | ICD-10-CM

## 2018-04-11 DIAGNOSIS — I10 Essential (primary) hypertension: Secondary | ICD-10-CM | POA: Diagnosis not present

## 2018-04-11 DIAGNOSIS — Z9842 Cataract extraction status, left eye: Secondary | ICD-10-CM | POA: Diagnosis not present

## 2018-04-11 DIAGNOSIS — K219 Gastro-esophageal reflux disease without esophagitis: Secondary | ICD-10-CM | POA: Diagnosis present

## 2018-04-11 DIAGNOSIS — Z803 Family history of malignant neoplasm of breast: Secondary | ICD-10-CM | POA: Diagnosis not present

## 2018-04-11 DIAGNOSIS — Z881 Allergy status to other antibiotic agents status: Secondary | ICD-10-CM

## 2018-04-11 DIAGNOSIS — M069 Rheumatoid arthritis, unspecified: Secondary | ICD-10-CM | POA: Diagnosis not present

## 2018-04-11 HISTORY — PX: TOTAL KNEE ARTHROPLASTY: SHX125

## 2018-04-11 LAB — TYPE AND SCREEN
ABO/RH(D): A POS
ANTIBODY SCREEN: NEGATIVE

## 2018-04-11 LAB — GLUCOSE, CAPILLARY: GLUCOSE-CAPILLARY: 122 mg/dL — AB (ref 70–99)

## 2018-04-11 SURGERY — ARTHROPLASTY, KNEE, TOTAL
Anesthesia: Spinal | Site: Knee | Laterality: Left

## 2018-04-11 MED ORDER — MENTHOL 3 MG MT LOZG: 1.0000 | LOZENGE | OROMUCOSAL | Status: DC | PRN

## 2018-04-11 MED ORDER — PROPOFOL 10 MG/ML IV BOLUS
INTRAVENOUS | Status: DC | PRN
  Administered 2018-04-11: 100 mg via INTRAVENOUS

## 2018-04-11 MED ORDER — 0.9 % SODIUM CHLORIDE (POUR BTL) OPTIME
TOPICAL | Status: DC | PRN
  Administered 2018-04-11: 1000 mL

## 2018-04-11 MED ORDER — CEFAZOLIN SODIUM-DEXTROSE 2-4 GM/100ML-% IV SOLN
2.0000 g | INTRAVENOUS | Status: AC
  Administered 2018-04-11: 2 g via INTRAVENOUS
  Filled 2018-04-11: qty 100

## 2018-04-11 MED ORDER — DEXAMETHASONE SODIUM PHOSPHATE 10 MG/ML IJ SOLN
INTRAMUSCULAR | Status: AC
  Filled 2018-04-11: qty 1

## 2018-04-11 MED ORDER — TRAMADOL HCL 50 MG PO TABS: 50.0000 mg | ORAL_TABLET | Freq: Four times a day (QID) | ORAL | Status: DC | PRN

## 2018-04-11 MED ORDER — METHOCARBAMOL 500 MG PO TABS
500.0000 mg | ORAL_TABLET | Freq: Four times a day (QID) | ORAL | Status: DC | PRN
  Administered 2018-04-11 – 2018-04-12 (×2): 500 mg via ORAL
  Filled 2018-04-11 (×2): qty 1

## 2018-04-11 MED ORDER — MIDAZOLAM HCL 2 MG/2ML IJ SOLN
1.0000 mg | INTRAMUSCULAR | Status: DC
  Administered 2018-04-11: 1 mg via INTRAVENOUS
  Filled 2018-04-11: qty 2

## 2018-04-11 MED ORDER — LACTATED RINGERS IV SOLN
INTRAVENOUS | Status: DC
  Administered 2018-04-11: 09:00:00 via INTRAVENOUS

## 2018-04-11 MED ORDER — ACETAMINOPHEN 500 MG PO TABS
1000.0000 mg | ORAL_TABLET | Freq: Four times a day (QID) | ORAL | Status: DC
  Administered 2018-04-11 – 2018-04-12 (×3): 1000 mg via ORAL
  Filled 2018-04-11 (×3): qty 2

## 2018-04-11 MED ORDER — BUPIVACAINE IN DEXTROSE 0.75-8.25 % IT SOLN
INTRATHECAL | Status: DC | PRN
  Administered 2018-04-11: 2 mL via INTRATHECAL

## 2018-04-11 MED ORDER — OXYCODONE HCL 5 MG PO TABS
5.0000 mg | ORAL_TABLET | ORAL | Status: DC | PRN
  Administered 2018-04-11: 5 mg via ORAL
  Administered 2018-04-12: 10 mg via ORAL
  Administered 2018-04-12 (×2): 5 mg via ORAL
  Filled 2018-04-11: qty 1
  Filled 2018-04-11 (×3): qty 2

## 2018-04-11 MED ORDER — FENTANYL CITRATE (PF) 100 MCG/2ML IJ SOLN
INTRAMUSCULAR | Status: AC
  Filled 2018-04-11: qty 2

## 2018-04-11 MED ORDER — SODIUM CHLORIDE 0.9 % IJ SOLN
INTRAMUSCULAR | Status: DC | PRN
  Administered 2018-04-11: 60 mL

## 2018-04-11 MED ORDER — PROPOFOL 10 MG/ML IV BOLUS
INTRAVENOUS | Status: AC
  Filled 2018-04-11: qty 40

## 2018-04-11 MED ORDER — ROPIVACAINE HCL 5 MG/ML IJ SOLN
INTRAMUSCULAR | Status: DC | PRN
  Administered 2018-04-11: 20 mL via PERINEURAL

## 2018-04-11 MED ORDER — CEFAZOLIN SODIUM-DEXTROSE 2-4 GM/100ML-% IV SOLN
2.0000 g | Freq: Four times a day (QID) | INTRAVENOUS | Status: AC
  Administered 2018-04-11 (×2): 2 g via INTRAVENOUS
  Filled 2018-04-11 (×2): qty 100

## 2018-04-11 MED ORDER — METOCLOPRAMIDE HCL 5 MG PO TABS: 5.0000 mg | ORAL_TABLET | Freq: Three times a day (TID) | ORAL | Status: DC | PRN

## 2018-04-11 MED ORDER — METHOCARBAMOL 1000 MG/10ML IJ SOLN
500.0000 mg | Freq: Four times a day (QID) | INTRAVENOUS | Status: DC | PRN
  Administered 2018-04-11: 500 mg via INTRAVENOUS
  Filled 2018-04-11: qty 550

## 2018-04-11 MED ORDER — TRANEXAMIC ACID 1000 MG/10ML IV SOLN
1000.0000 mg | INTRAVENOUS | Status: AC
  Administered 2018-04-11: 1000 mg via INTRAVENOUS
  Filled 2018-04-11: qty 10

## 2018-04-11 MED ORDER — SODIUM CHLORIDE 0.9 % IV SOLN
INTRAVENOUS | Status: DC
  Administered 2018-04-11: 18:00:00 via INTRAVENOUS

## 2018-04-11 MED ORDER — OXYCODONE HCL 5 MG PO TABS: 5.0000 mg | ORAL_TABLET | Freq: Once | ORAL | Status: DC | PRN

## 2018-04-11 MED ORDER — HYDROCHLOROTHIAZIDE 25 MG PO TABS
25.0000 mg | ORAL_TABLET | Freq: Every day | ORAL | Status: DC
  Administered 2018-04-12: 25 mg via ORAL
  Filled 2018-04-11: qty 1

## 2018-04-11 MED ORDER — PHENOL 1.4 % MT LIQD: 1.0000 | OROMUCOSAL | Status: DC | PRN

## 2018-04-11 MED ORDER — HYDROMORPHONE HCL 1 MG/ML IJ SOLN
INTRAMUSCULAR | Status: AC
  Filled 2018-04-11: qty 1

## 2018-04-11 MED ORDER — CHLORHEXIDINE GLUCONATE 4 % EX LIQD: 60.0000 mL | Freq: Once | CUTANEOUS | Status: DC

## 2018-04-11 MED ORDER — BUPIVACAINE LIPOSOME 1.3 % IJ SUSP
INTRAMUSCULAR | Status: DC | PRN
  Administered 2018-04-11: 20 mL

## 2018-04-11 MED ORDER — FLEET ENEMA 7-19 GM/118ML RE ENEM: 1.0000 | ENEMA | Freq: Once | RECTAL | Status: DC | PRN

## 2018-04-11 MED ORDER — PROMETHAZINE HCL 25 MG/ML IJ SOLN
INTRAMUSCULAR | Status: AC
  Filled 2018-04-11: qty 1

## 2018-04-11 MED ORDER — GABAPENTIN 300 MG PO CAPS
300.0000 mg | ORAL_CAPSULE | Freq: Once | ORAL | Status: AC
  Administered 2018-04-11: 300 mg via ORAL
  Filled 2018-04-11: qty 1

## 2018-04-11 MED ORDER — FENTANYL CITRATE (PF) 100 MCG/2ML IJ SOLN
50.0000 ug | INTRAMUSCULAR | Status: DC
  Administered 2018-04-11: 50 ug via INTRAVENOUS
  Filled 2018-04-11: qty 2

## 2018-04-11 MED ORDER — OXYCODONE HCL 5 MG PO TABS: 10.0000 mg | ORAL_TABLET | ORAL | Status: DC | PRN

## 2018-04-11 MED ORDER — TOFACITINIB CITRATE ER 11 MG PO TB24: 1.0000 | ORAL_TABLET | Freq: Every day | ORAL | Status: DC

## 2018-04-11 MED ORDER — ASPIRIN EC 325 MG PO TBEC
325.0000 mg | DELAYED_RELEASE_TABLET | Freq: Two times a day (BID) | ORAL | Status: DC
  Administered 2018-04-12: 325 mg via ORAL
  Filled 2018-04-11: qty 1

## 2018-04-11 MED ORDER — PANTOPRAZOLE SODIUM 40 MG PO TBEC
40.0000 mg | DELAYED_RELEASE_TABLET | Freq: Every day | ORAL | Status: DC
  Administered 2018-04-12: 40 mg via ORAL
  Filled 2018-04-11: qty 1

## 2018-04-11 MED ORDER — OXYBUTYNIN CHLORIDE ER 5 MG PO TB24
10.0000 mg | ORAL_TABLET | Freq: Every day | ORAL | Status: DC
  Administered 2018-04-12: 10 mg via ORAL
  Filled 2018-04-11: qty 2

## 2018-04-11 MED ORDER — ATORVASTATIN CALCIUM 40 MG PO TABS
40.0000 mg | ORAL_TABLET | Freq: Every day | ORAL | Status: DC
  Administered 2018-04-11 – 2018-04-12 (×2): 40 mg via ORAL
  Filled 2018-04-11 (×2): qty 1

## 2018-04-11 MED ORDER — AMLODIPINE BESYLATE 5 MG PO TABS
2.5000 mg | ORAL_TABLET | Freq: Every day | ORAL | Status: DC
  Administered 2018-04-12: 2.5 mg via ORAL
  Filled 2018-04-11: qty 1

## 2018-04-11 MED ORDER — POLYETHYLENE GLYCOL 3350 17 G PO PACK: 17.0000 g | PACK | Freq: Every day | ORAL | Status: DC | PRN

## 2018-04-11 MED ORDER — DOCUSATE SODIUM 100 MG PO CAPS
100.0000 mg | ORAL_CAPSULE | Freq: Two times a day (BID) | ORAL | Status: DC
  Administered 2018-04-11 – 2018-04-12 (×2): 100 mg via ORAL
  Filled 2018-04-11 (×2): qty 1

## 2018-04-11 MED ORDER — HYDROMORPHONE HCL 1 MG/ML IJ SOLN: 0.5000 mg | INTRAMUSCULAR | Status: DC | PRN

## 2018-04-11 MED ORDER — SODIUM CHLORIDE 0.9 % IR SOLN
Status: DC | PRN
  Administered 2018-04-11: 1000 mL

## 2018-04-11 MED ORDER — METOCLOPRAMIDE HCL 5 MG/ML IJ SOLN: 5.0000 mg | Freq: Three times a day (TID) | INTRAMUSCULAR | Status: DC | PRN

## 2018-04-11 MED ORDER — OXYCODONE HCL 5 MG/5ML PO SOLN
5.0000 mg | Freq: Once | ORAL | Status: DC | PRN
  Filled 2018-04-11: qty 5

## 2018-04-11 MED ORDER — HYDROMORPHONE HCL 1 MG/ML IJ SOLN
0.2500 mg | INTRAMUSCULAR | Status: DC | PRN
  Administered 2018-04-11 (×2): 0.5 mg via INTRAVENOUS

## 2018-04-11 MED ORDER — SODIUM CHLORIDE 0.9 % IJ SOLN
INTRAMUSCULAR | Status: AC
  Filled 2018-04-11: qty 50

## 2018-04-11 MED ORDER — LATANOPROST 0.005 % OP SOLN
1.0000 [drp] | Freq: Every day | OPHTHALMIC | Status: DC
  Administered 2018-04-11: 1 [drp] via OPHTHALMIC
  Filled 2018-04-11: qty 2.5

## 2018-04-11 MED ORDER — TRANEXAMIC ACID 1000 MG/10ML IV SOLN
1000.0000 mg | Freq: Once | INTRAVENOUS | Status: AC
  Administered 2018-04-11: 1000 mg via INTRAVENOUS
  Filled 2018-04-11: qty 1100

## 2018-04-11 MED ORDER — DEXAMETHASONE SODIUM PHOSPHATE 10 MG/ML IJ SOLN: 8.0000 mg | Freq: Once | INTRAMUSCULAR | Status: DC

## 2018-04-11 MED ORDER — SODIUM CHLORIDE 0.9 % IJ SOLN
INTRAMUSCULAR | Status: AC
  Filled 2018-04-11: qty 10

## 2018-04-11 MED ORDER — PROMETHAZINE HCL 25 MG/ML IJ SOLN
6.2500 mg | INTRAMUSCULAR | Status: DC | PRN
  Administered 2018-04-11: 6.25 mg via INTRAVENOUS

## 2018-04-11 MED ORDER — INSULIN ASPART 100 UNIT/ML ~~LOC~~ SOLN: 0.0000 [IU] | Freq: Three times a day (TID) | SUBCUTANEOUS | Status: DC

## 2018-04-11 MED ORDER — ONDANSETRON HCL 4 MG/2ML IJ SOLN
INTRAMUSCULAR | Status: DC | PRN
  Administered 2018-04-11: 4 mg via INTRAVENOUS

## 2018-04-11 MED ORDER — ONDANSETRON HCL 4 MG/2ML IJ SOLN
INTRAMUSCULAR | Status: AC
  Filled 2018-04-11: qty 2

## 2018-04-11 MED ORDER — PROPOFOL 500 MG/50ML IV EMUL
INTRAVENOUS | Status: DC | PRN
  Administered 2018-04-11: 40 ug/kg/min via INTRAVENOUS

## 2018-04-11 MED ORDER — STERILE WATER FOR IRRIGATION IR SOLN
Status: DC | PRN
  Administered 2018-04-11: 2000 mL

## 2018-04-11 MED ORDER — FENTANYL CITRATE (PF) 100 MCG/2ML IJ SOLN
INTRAMUSCULAR | Status: DC | PRN
  Administered 2018-04-11 (×2): 50 ug via INTRAVENOUS

## 2018-04-11 MED ORDER — BISACODYL 10 MG RE SUPP: 10.0000 mg | Freq: Every day | RECTAL | Status: DC | PRN

## 2018-04-11 MED ORDER — ONDANSETRON HCL 4 MG/2ML IJ SOLN: 4.0000 mg | Freq: Four times a day (QID) | INTRAMUSCULAR | Status: DC | PRN

## 2018-04-11 MED ORDER — DEXAMETHASONE SODIUM PHOSPHATE 10 MG/ML IJ SOLN
10.0000 mg | Freq: Once | INTRAMUSCULAR | Status: AC
  Administered 2018-04-12: 10 mg via INTRAVENOUS
  Filled 2018-04-11: qty 1

## 2018-04-11 MED ORDER — ACETAMINOPHEN 10 MG/ML IV SOLN
1000.0000 mg | Freq: Four times a day (QID) | INTRAVENOUS | Status: DC
  Administered 2018-04-11: 1000 mg via INTRAVENOUS
  Filled 2018-04-11: qty 100

## 2018-04-11 MED ORDER — ONDANSETRON HCL 4 MG PO TABS: 4.0000 mg | ORAL_TABLET | Freq: Four times a day (QID) | ORAL | Status: DC | PRN

## 2018-04-11 MED ORDER — DIPHENHYDRAMINE HCL 12.5 MG/5ML PO ELIX: 12.5000 mg | ORAL_SOLUTION | ORAL | Status: DC | PRN

## 2018-04-11 SURGICAL SUPPLY — 50 items
BAG SPEC THK2 15X12 ZIP CLS (MISCELLANEOUS) ×1
BAG ZIPLOCK 12X15 (MISCELLANEOUS) ×2 IMPLANT
BANDAGE ACE 6X5 VEL STRL LF (GAUZE/BANDAGES/DRESSINGS) ×2 IMPLANT
BLADE SAG 18X100X1.27 (BLADE) ×2 IMPLANT
BLADE SAW SGTL 11.0X1.19X90.0M (BLADE) ×2 IMPLANT
BOWL SMART MIX CTS (DISPOSABLE) ×2 IMPLANT
CAP KNEE TOTAL 3 SIGMA ×2 IMPLANT
CEMENT HV SMART SET (Cement) ×4 IMPLANT
COVER SURGICAL LIGHT HANDLE (MISCELLANEOUS) ×2 IMPLANT
CUFF TOURN SGL QUICK 34 (TOURNIQUET CUFF) ×2
CUFF TRNQT CYL 34X4X40X1 (TOURNIQUET CUFF) ×1 IMPLANT
DECANTER SPIKE VIAL GLASS SM (MISCELLANEOUS) ×2 IMPLANT
DRAPE U-SHAPE 47X51 STRL (DRAPES) ×2 IMPLANT
DRSG ADAPTIC 3X8 NADH LF (GAUZE/BANDAGES/DRESSINGS) ×2 IMPLANT
DRSG PAD ABDOMINAL 8X10 ST (GAUZE/BANDAGES/DRESSINGS) ×2 IMPLANT
DURAPREP 26ML APPLICATOR (WOUND CARE) ×2 IMPLANT
ELECT REM PT RETURN 15FT ADLT (MISCELLANEOUS) ×2 IMPLANT
EVACUATOR 1/8 PVC DRAIN (DRAIN) ×2 IMPLANT
GAUZE SPONGE 4X4 12PLY STRL (GAUZE/BANDAGES/DRESSINGS) ×2 IMPLANT
GLOVE BIO SURGEON STRL SZ7 (GLOVE) ×2 IMPLANT
GLOVE BIO SURGEON STRL SZ8 (GLOVE) ×2 IMPLANT
GLOVE BIOGEL PI IND STRL 6.5 (GLOVE) IMPLANT
GLOVE BIOGEL PI IND STRL 7.0 (GLOVE) ×1 IMPLANT
GLOVE BIOGEL PI IND STRL 8 (GLOVE) ×1 IMPLANT
GLOVE BIOGEL PI INDICATOR 6.5 (GLOVE)
GLOVE BIOGEL PI INDICATOR 7.0 (GLOVE) ×1
GLOVE BIOGEL PI INDICATOR 8 (GLOVE) ×1
GLOVE SURG SS PI 6.5 STRL IVOR (GLOVE) IMPLANT
GOWN STRL REUS W/TWL LRG LVL3 (GOWN DISPOSABLE) ×4 IMPLANT
HANDPIECE INTERPULSE COAX TIP (DISPOSABLE) ×2
HOLDER FOLEY CATH W/STRAP (MISCELLANEOUS) IMPLANT
IMMOBILIZER KNEE 20 (SOFTGOODS) ×2
IMMOBILIZER KNEE 20 THIGH 36 (SOFTGOODS) ×1 IMPLANT
MANIFOLD NEPTUNE II (INSTRUMENTS) ×2 IMPLANT
NS IRRIG 1000ML POUR BTL (IV SOLUTION) ×2 IMPLANT
PACK TOTAL KNEE CUSTOM (KITS) ×2 IMPLANT
PADDING CAST COTTON 6X4 STRL (CAST SUPPLIES) ×4 IMPLANT
POSITIONER SURGICAL ARM (MISCELLANEOUS) ×2 IMPLANT
SET HNDPC FAN SPRY TIP SCT (DISPOSABLE) ×1 IMPLANT
STRIP CLOSURE SKIN 1/2X4 (GAUZE/BANDAGES/DRESSINGS) ×4 IMPLANT
SUT MNCRL AB 4-0 PS2 18 (SUTURE) ×2 IMPLANT
SUT STRATAFIX 0 PDS 27 VIOLET (SUTURE) ×2
SUT VIC AB 2-0 CT1 27 (SUTURE) ×4
SUT VIC AB 2-0 CT1 TAPERPNT 27 (SUTURE) ×2 IMPLANT
SUTURE STRATFX 0 PDS 27 VIOLET (SUTURE) ×1 IMPLANT
TRAY FOLEY CATH 14FRSI W/METER (CATHETERS) ×2 IMPLANT
TRAY FOLEY MTR SLVR 16FR STAT (SET/KITS/TRAYS/PACK) IMPLANT
WATER STERILE IRR 1000ML POUR (IV SOLUTION) ×4 IMPLANT
WRAP KNEE MAXI GEL POST OP (GAUZE/BANDAGES/DRESSINGS) ×2 IMPLANT
YANKAUER SUCT BULB TIP 10FT TU (MISCELLANEOUS) ×2 IMPLANT

## 2018-04-11 NOTE — Transfer of Care (Signed)
Immediate Anesthesia Transfer of Care Note  Patient: Brittany Stark  Procedure(s) Performed: LEFT TOTAL KNEE ARTHROPLASTY (Left Knee)  Patient Location: PACU  Anesthesia Type:GA combined with regional for post-op pain  Level of Consciousness: awake, drowsy and patient cooperative  Airway & Oxygen Therapy: Patient Spontanous Breathing and Patient connected to face mask oxygen  Post-op Assessment: Report given to RN, Post -op Vital signs reviewed and stable and Patient moving all extremities  Post vital signs: Reviewed and stable  Last Vitals:  Vitals Value Taken Time  BP 123/73 04/11/2018  1:13 PM  Temp    Pulse 58 04/11/2018  1:15 PM  Resp 14 04/11/2018  1:15 PM  SpO2 97 % 04/11/2018  1:15 PM  Vitals shown include unvalidated device data.  Last Pain:  Vitals:   04/11/18 0854  TempSrc: Oral      Patients Stated Pain Goal: 3 (62/[id number]/69 [id number])  Complications: No apparent anesthesia complications

## 2018-04-11 NOTE — Anesthesia Postprocedure Evaluation (Signed)
Anesthesia Post Note  Patient: [name]  Procedure(s) Performed: LEFT TOTAL KNEE ARTHROPLASTY (Left Knee)     Patient location during evaluation: PACU Anesthesia Type: Spinal Level of consciousness: oriented and awake and alert Pain management: pain level controlled Vital Signs Assessment: post-procedure vital signs reviewed and stable Respiratory status: spontaneous breathing and respiratory function stable Cardiovascular status: blood pressure returned to baseline and stable Postop Assessment: no headache, no backache and no apparent nausea or vomiting Anesthetic complications: no    Last Vitals:  Vitals:   04/11/18 1400 04/11/18 1415  BP: 116/66 126/81  Pulse: 68 66  Resp: 10 13  Temp:  (!) 36.4 C  SpO2: 96% 96%    Last Pain:  Vitals:   04/11/18 1415  TempSrc:   PainSc: Asleep                 Lynda Rainwater

## 2018-04-11 NOTE — Anesthesia Procedure Notes (Signed)
Anesthesia Regional Block: Adductor canal block   Pre-Anesthetic Checklist: ,, timeout performed, Correct Patient, Correct Site, Correct Laterality, Correct Procedure, Correct Position, site marked, Risks and benefits discussed,  Surgical consent,  Pre-op evaluation,  At surgeon's request and post-op pain management  Laterality: Left  Prep: chloraprep       Needles:  Injection technique: Single-shot  Needle Type: Stimiplex     Needle Length: 9cm  Needle Gauge: 21     Additional Needles:   Procedures:,,,, ultrasound used (permanent image in chart),,,,  Narrative:  Start time: 04/11/2018 9:47 AM End time: 04/11/2018 9:52 AM Injection made incrementally with aspirations every 5 mL.  Performed by: Personally  Anesthesiologist: Lynda Rainwater, MD

## 2018-04-11 NOTE — Anesthesia Procedure Notes (Signed)
Procedure Name: LMA Insertion Date/Time: 04/11/2018 11:56 AM Performed by: Victoriano Lain, CRNA Pre-anesthesia Checklist: Patient identified, Emergency Drugs available, Suction available, Patient being monitored and Timeout performed Patient Re-evaluated:Patient Re-evaluated prior to induction Oxygen Delivery Method: Circle system utilized Preoxygenation: Pre-oxygenation with 100% oxygen Induction Type: IV induction Ventilation: Mask ventilation without difficulty LMA: LMA with gastric port inserted LMA Size: 4.0 Number of attempts: 1 Airway Equipment and Method: Stylet Placement Confirmation: positive ETCO2 and breath sounds checked- equal and bilateral Tube secured with: Tape Dental Injury: Teeth and Oropharynx as per pre-operative assessment

## 2018-04-11 NOTE — Plan of Care (Signed)
  Problem: Education: Goal: Knowledge of General Education information will improve Description: Including pain rating scale, medication(s)/side effects and non-pharmacologic comfort measures Outcome: Progressing   Problem: Clinical Measurements: Goal: Ability to maintain clinical measurements within normal limits will improve Outcome: Progressing   Problem: Clinical Measurements: Goal: Respiratory complications will improve Outcome: Progressing   Problem: Activity: Goal: Risk for activity intolerance will decrease Outcome: Progressing   

## 2018-04-11 NOTE — Interval H&P Note (Signed)
History and Physical Interval Note:  04/11/2018 9:22 AM  Brittany Stark  has presented today for surgery, with the diagnosis of left knee osteoarthritis  The various methods of treatment have been discussed with the patient and family. After consideration of risks, benefits and other options for treatment, the patient has consented to  Procedure(s): LEFT TOTAL KNEE ARTHROPLASTY (Left) as a surgical intervention .  The patient's history has been reviewed, patient examined, no change in status, stable for surgery.  I have reviewed the patient's chart and labs.  Questions were answered to the patient's satisfaction.     Pilar Plate [place] Kotarski

## 2018-04-11 NOTE — Discharge Instructions (Signed)
° °Dr. Frank Aluisio °Total Joint Specialist °[facility] °[address] °[place], [place] [address] °[phone] ° °TOTAL KNEE REPLACEMENT POSTOPERATIVE DIRECTIONS ° °Knee Rehabilitation, Guidelines Following Surgery  °Results after knee surgery are often greatly improved when you follow the exercise, range of motion and muscle strengthening exercises prescribed by your doctor. Safety measures are also important to protect the knee from further injury. Any time any of these exercises cause you to have increased pain or swelling in your knee joint, decrease the amount until you are comfortable again and slowly increase them. If you have problems or questions, call your caregiver or physical therapist for advice.  ° °HOME CARE INSTRUCTIONS  °• Remove items at home which could result in a fall. This includes throw rugs or furniture in walking pathways.  °· ICE to the affected knee every three hours for 30 minutes at a time and then as needed for pain and swelling.  Continue to use ice on the knee for pain and swelling from surgery. You may notice swelling that will progress down to the foot and ankle.  This is normal after surgery.  Elevate the leg when you are not up walking on it.   °· Continue to use the breathing machine which will help keep your temperature down.  It is common for your temperature to cycle up and down following surgery, especially at night when you are not up moving around and exerting yourself.  The breathing machine keeps your lungs expanded and your temperature down. °· Do not place pillow under knee, focus on keeping the knee straight while resting ° °DIET °You may resume your previous home diet once your are discharged from the hospital. ° °DRESSING / WOUND CARE / SHOWERING °You may shower 3 days after surgery, but keep the wounds dry during showering.  You may use an occlusive plastic wrap (Press'n Seal for example), NO SOAKING/SUBMERGING IN THE BATHTUB.  If the bandage  gets wet, change with a clean dry gauze.  If the incision gets wet, pat the wound dry with a clean towel. °You may start showering once you are discharged home but do not submerge the incision under water. Just pat the incision dry and apply a dry gauze dressing on daily. °Change the surgical dressing daily and reapply a dry dressing each time. ° °ACTIVITY °Walk with your walker as instructed. °Use walker as long as suggested by your caregivers. °Avoid periods of inactivity such as sitting longer than an hour when not asleep. This helps prevent blood clots.  °You may resume a sexual relationship in one month or when given the OK by your doctor.  °You may return to work once you are cleared by your doctor.  °Do not drive a car for 6 weeks or until released by you surgeon.  °Do not drive while taking narcotics. ° °WEIGHT BEARING °Weight bearing as tolerated with assist device (walker, cane, etc) as directed, use it as long as suggested by your surgeon or therapist, typically at least 4-6 weeks. ° °POSTOPERATIVE CONSTIPATION PROTOCOL °Constipation - defined medically as fewer than three stools per week and severe constipation as less than one stool per week. ° °One of the most common issues patients have following surgery is constipation.  Even if you have a regular bowel pattern at home, your normal regimen is likely to be disrupted due to multiple reasons following surgery.  Combination of anesthesia, postoperative narcotics, change in appetite and fluid intake all can affect your bowels.    In order to avoid complications following surgery, here are some recommendations in order to help you during your recovery period. ° °Colace (docusate) - Pick up an over-the-counter form of Colace or another stool softener and take twice a day as long as you are requiring postoperative pain medications.  Take with a full glass of water daily.  If you experience loose stools or diarrhea, hold the colace until you stool forms back  up.  If your symptoms do not get better within 1 week or if they get worse, check with your doctor. ° °Dulcolax (bisacodyl) - Pick up over-the-counter and take as directed by the product packaging as needed to assist with the movement of your bowels.  Take with a full glass of water.  Use this product as needed if not relieved by Colace only.  ° °MiraLax (polyethylene glycol) - Pick up over-the-counter to have on hand.  MiraLax is a solution that will increase the amount of water in your bowels to assist with bowel movements.  Take as directed and can mix with a glass of water, juice, soda, coffee, or tea.  Take if you go more than two days without a movement. °Do not use MiraLax more than once per day. Call your doctor if you are still constipated or irregular after using this medication for 7 days in a row. ° °If you continue to have problems with postoperative constipation, please contact the office for further assistance and recommendations.  If you experience "the worst abdominal pain ever" or develop nausea or vomiting, please contact the office immediatly for further recommendations for treatment. ° °ITCHING ° If you experience itching with your medications, try taking only a single pain pill, or even half a pain pill at a time.  You can also use Benadryl over the counter for itching or also to help with sleep.  ° °TED HOSE STOCKINGS °Wear the elastic stockings on both legs for three weeks following surgery during the day but you may remove then at night for sleeping. ° °MEDICATIONS °See your medication summary on the “After Visit Summary” that the nursing staff will review with you prior to discharge.  You may have some home medications which will be placed on hold until you complete the course of blood thinner medication.  It is important for you to complete the blood thinner medication as prescribed by your surgeon.  Continue your approved medications as instructed at time of discharge. ° °PRECAUTIONS °If  you experience chest pain or shortness of breath - call 911 immediately for transfer to the hospital emergency department.  °If you develop a fever greater that 101 F, purulent drainage from wound, increased redness or drainage from wound, foul odor from the wound/dressing, or calf pain - CONTACT YOUR SURGEON.   °                                                °FOLLOW-UP APPOINTMENTS °Make sure you keep all of your appointments after your operation with your surgeon and caregivers. You should call the office at the above phone number and make an appointment for approximately two weeks after the date of your surgery or on the date instructed by your surgeon outlined in the "After Visit Summary". ° ° °RANGE OF MOTION AND STRENGTHENING EXERCISES  °Rehabilitation of the knee is important following a knee injury or   an operation. After just a few days of immobilization, the muscles of the thigh which control the knee become weakened and shrink (atrophy). Knee exercises are designed to build up the tone and strength of the thigh muscles and to improve knee motion. Often times heat used for twenty to thirty minutes before working out will loosen up your tissues and help with improving the range of motion but do not use heat for the first two weeks following surgery. These exercises can be done on a training (exercise) mat, on the floor, on a table or on a bed. Use what ever works the best and is most comfortable for you Knee exercises include:  °• Leg Lifts - While your knee is still immobilized in a splint or cast, you can do straight leg raises. Lift the leg to 60 degrees, hold for 3 sec, and slowly lower the leg. Repeat 10-20 times 2-3 times daily. Perform this exercise against resistance later as your knee gets better.  °• Quad and Hamstring Sets - Tighten up the muscle on the front of the thigh (Quad) and hold for 5-10 sec. Repeat this 10-20 times hourly. Hamstring sets are done by pushing the foot backward against an  object and holding for 5-10 sec. Repeat as with quad sets.  °· Leg Slides: Lying on your back, slowly slide your foot toward your buttocks, bending your knee up off the floor (only go as far as is comfortable). Then slowly slide your foot back down until your leg is flat on the floor again. °· Angel Wings: Lying on your back spread your legs to the side as far apart as you can without causing discomfort.  °A rehabilitation program following serious knee injuries can speed recovery and prevent re-injury in the future due to weakened muscles. Contact your doctor or a physical therapist for more information on knee rehabilitation.  ° °IF YOU ARE TRANSFERRED TO A SKILLED REHAB FACILITY °If the patient is transferred to a skilled rehab facility following release from the hospital, a list of the current medications will be sent to the facility for the patient to continue.  When discharged from the skilled rehab facility, please have the facility set up the patient's Home Health Physical Therapy prior to being released. Also, the skilled facility will be responsible for providing the patient with their medications at time of release from the facility to include their pain medication, the muscle relaxants, and their blood thinner medication. If the patient is still at the rehab facility at time of the two week follow up appointment, the skilled rehab facility will also need to assist the patient in arranging follow up appointment in our office and any transportation needs. ° °MAKE SURE YOU:  °• Understand these instructions.  °• Get help right away if you are not doing well or get worse.  ° ° °Pick up stool softner and laxative for home use following surgery while on pain medications. °Do not submerge incision under water. °Please use good hand washing techniques while changing dressing each day. °May shower starting three days after surgery. °Please use a clean towel to pat the incision dry following showers. °Continue to  use ice for pain and swelling after surgery. °Do not use any lotions or creams on the incision until instructed by your surgeon. ° °

## 2018-04-11 NOTE — Progress Notes (Signed)
Assisted Dr. Miller with left, ultrasound guided, adductor canal block. Side rails up, monitors on throughout procedure. See vital signs in flow sheet. Tolerated Procedure well.  

## 2018-04-11 NOTE — Anesthesia Procedure Notes (Signed)
Spinal  Patient location during procedure: OR Start time: 04/11/2018 11:30 AM End time: 04/11/2018 11:35 AM Staffing Anesthesiologist: Lynda Rainwater, MD Performed: anesthesiologist  Preanesthetic Checklist Completed: patient identified, site marked, surgical consent, pre-op evaluation, timeout performed, IV checked, risks and benefits discussed and monitors and equipment checked Spinal Block Patient position: sitting Prep: ChloraPrep Patient monitoring: heart rate, cardiac monitor, continuous pulse ox and blood pressure Approach: midline Location: L3-4 Injection technique: single-shot Needle Needle type: Quincke  Needle gauge: 22 G Needle length: 9 cm

## 2018-04-11 NOTE — Op Note (Signed)
OPERATIVE REPORT-TOTAL KNEE ARTHROPLASTY   Pre-operative diagnosis- Osteoarthritis  Left knee(s)  Post-operative diagnosis- Osteoarthritis Left knee(s)  Procedure-  Left  Total Knee Arthroplasty  Surgeon- Dione Plover. Kyree Fedorko, MD  Assistant- Theresa Duty, PA-C   Anesthesia-  GA combined with regional for post-op pain  EBL-25 ml   Drains Hemovac  Tourniquet time- 41 minutes @ 502 mm Hg  Complications- None  Condition-PACU - hemodynamically stable.   Brief Clinical Note  Brittany Stark is a 72 y.o. year old female with end stage OA of her left knee with progressively worsening pain and dysfunction. She has constant pain, with activity and at rest and significant functional deficits with difficulties even with ADLs. She has had extensive non-op management including analgesics, injections of cortisone and viscosupplements, and home exercise program, but remains in significant pain with significant dysfunction. Radiographs show bone on bone arthritis lateral and patellofemoral. She presents now for left Total Knee Arthroplasty.    Procedure in detail---   The patient is brought into the operating room and positioned supine on the operating table. After successful administration of  GA combined with regional for post-op pain,   a tourniquet is placed high on the  Left thigh(s) and the lower extremity is prepped and draped in the usual sterile fashion. Time out is performed by the operating team and then the  Left lower extremity is wrapped in Esmarch, knee flexed and the tourniquet inflated to 300 mmHg.       A midline incision is made with a ten blade through the subcutaneous tissue to the level of the extensor mechanism. A fresh blade is used to make a medial parapatellar arthrotomy. Soft tissue over the proximal medial tibia is subperiosteally elevated to the joint line with a knife and into the semimembranosus bursa with a Cobb elevator. Soft tissue over the proximal lateral tibia is  elevated with attention being paid to avoiding the patellar tendon on the tibial tubercle. The patella is everted, knee flexed 90 degrees and the ACL and PCL are removed. Findings are bone on bone lateral and patellofemoral with large global osteophytes.        The drill is used to create a starting hole in the distal femur and the canal is thoroughly irrigated with sterile saline to remove the fatty contents. The 5 degree Left  valgus alignment guide is placed into the femoral canal and the distal femoral cutting block is pinned to remove 10 mm off the distal femur. Resection is made with an oscillating saw.      The tibia is subluxed forward and the menisci are removed. The extramedullary alignment guide is placed referencing proximally at the medial aspect of the tibial tubercle and distally along the second metatarsal axis and tibial crest. The block is pinned to remove 47mm off the more deficient lateral  side. Resection is made with an oscillating saw. Size 4is the most appropriate size for the tibia and the proximal tibia is prepared with the modular drill and keel punch for that size.      The femoral sizing guide is placed and size 3 is most appropriate. Rotation is marked off the epicondylar axis and confirmed by creating a rectangular flexion gap at 90 degrees. The size 3 cutting block is pinned in this rotation and the anterior, posterior and chamfer cuts are made with the oscillating saw. The intercondylar block is then placed and that cut is made.      Trial size 4 tibial component,  trial size 3 posterior stabilized femur and a 12.5  mm posterior stabilized rotating platform insert trial is placed. Full extension is achieved with excellent varus/valgus and anterior/posterior balance throughout full range of motion. The patella is everted and thickness measured to be 22  mm. Free hand resection is taken to 12 mm, a 38 template is placed, lug holes are drilled, trial patella is placed, and it tracks  normally. Osteophytes are removed off the posterior femur with the trial in place. All trials are removed and the cut bone surfaces prepared with pulsatile lavage. Cement is mixed and once ready for implantation, the size 4 tibial implant, size  3 posterior stabilized femoral component, and the size 38 patella are cemented in place and the patella is held with the clamp. The trial insert is placed and the knee held in full extension. The Exparel (20 ml mixed with 60 ml saline) is injected into the extensor mechanism, posterior capsule, medial and lateral gutters and subcutaneous tissues.  All extruded cement is removed and once the cement is hard the permanent 12.5 mm posterior stabilized rotating platform insert is placed into the tibial tray.      The wound is copiously irrigated with saline solution and the extensor mechanism closed over a hemovac drain with #1 V-loc suture. The tourniquet is released for a total tourniquet time of 41  minutes. Flexion against gravity is 140 degrees and the patella tracks normally. Subcutaneous tissue is closed with 2.0 vicryl and subcuticular with running 4.0 Monocryl. The incision is cleaned and dried and steri-strips and a bulky sterile dressing are applied. The limb is placed into a knee immobilizer and the patient is awakened and transported to recovery in stable condition.      Please note that a surgical assistant was a medical necessity for this procedure in order to perform it in a safe and expeditious manner. Surgical assistant was necessary to retract the ligaments and vital neurovascular structures to prevent injury to them and also necessary for proper positioning of the limb to allow for anatomic placement of the prosthesis.   Dione Plover Brittany Yeakle, MD    04/11/2018, 12:39 PM

## 2018-04-11 NOTE — Anesthesia Preprocedure Evaluation (Signed)
Anesthesia Evaluation  Patient identified by MRN, date of birth, ID band Patient awake    Reviewed: Allergy & Precautions, NPO status , Patient's Chart, lab work & pertinent test results  Airway Mallampati: I  TM Distance: >3 FB Neck ROM: Full    Dental no notable dental hx.    Pulmonary neg pulmonary ROS,    Pulmonary exam normal breath sounds clear to auscultation       Cardiovascular hypertension, Pt. on medications negative cardio ROS Normal cardiovascular exam Rhythm:Regular Rate:Normal     Neuro/Psych Depression negative neurological ROS     GI/Hepatic Neg liver ROS, GERD  Medicated and Controlled,  Endo/Other  negative endocrine ROS  Renal/GU negative Renal ROS  negative genitourinary   Musculoskeletal negative musculoskeletal ROS (+) Arthritis , Osteoarthritis,    Abdominal Normal abdominal exam  (+)   Peds negative pediatric ROS (+)  Hematology negative hematology ROS (+)   Anesthesia Other Findings   Reproductive/Obstetrics negative OB ROS                             Anesthesia Physical  Anesthesia Plan  ASA: II  Anesthesia Plan: Spinal   Post-op Pain Management:  Regional for Post-op pain   Induction: Intravenous  PONV Risk Score and Plan: 2 and Ondansetron and Midazolam  Airway Management Planned: Simple Face Mask  Additional Equipment:   Intra-op Plan:   Post-operative Plan:   Informed Consent: I have reviewed the patients History and Physical, chart, labs and discussed the procedure including the risks, benefits and alternatives for the proposed anesthesia with the patient or authorized representative who has indicated his/her understanding and acceptance.   Dental advisory given  Plan Discussed with: CRNA and Surgeon  Anesthesia Plan Comments:         Anesthesia Quick Evaluation

## 2018-04-12 ENCOUNTER — Encounter (HOSPITAL_COMMUNITY): Payer: Self-pay | Admitting: Orthopedic Surgery

## 2018-04-12 LAB — BASIC METABOLIC PANEL
Anion gap: 7 (ref 5–15)
BUN: 12 mg/dL (ref 8–23)
CALCIUM: 8.3 mg/dL — AB (ref 8.9–10.3)
CO2: 28 mmol/L (ref 22–32)
CREATININE: 0.62 mg/dL (ref 0.44–1.00)
Chloride: 106 mmol/L (ref 98–111)
GFR calc Af Amer: >60 mL/min (ref >60)
GLUCOSE: 128 mg/dL — AB (ref 70–99)
Potassium: 3.8 mmol/L (ref 3.5–5.1)
Sodium: 141 mmol/L (ref 135–145)

## 2018-04-12 LAB — CBC
HEMATOCRIT: 33.1 % — AB (ref 36.0–46.0)
Hemoglobin: 10.5 g/dL — ABNORMAL LOW (ref 12.0–15.0)
MCH: 30.9 pg (ref 26.0–34.0)
MCHC: 31.7 g/dL (ref 30.0–36.0)
MCV: 97.4 fL (ref 78.0–100.0)
PLATELETS: 215 10*3/uL (ref 150–400)
RBC: 3.4 MIL/uL — ABNORMAL LOW (ref 3.87–5.11)
RDW: 15.5 % (ref 11.5–15.5)
WBC: 6.6 10*3/uL (ref 4.0–10.5)

## 2018-04-12 MED ORDER — ASPIRIN 325 MG PO TBEC: 325.0000 mg | DELAYED_RELEASE_TABLET | Freq: Two times a day (BID) | ORAL | 0 refills | Status: AC

## 2018-04-12 MED ORDER — TRAMADOL HCL 50 MG PO TABS: 50.0000 mg | ORAL_TABLET | Freq: Four times a day (QID) | ORAL | 0 refills | Status: DC | PRN

## 2018-04-12 MED ORDER — METHOCARBAMOL 500 MG PO TABS: 500.0000 mg | ORAL_TABLET | Freq: Four times a day (QID) | ORAL | 0 refills | Status: DC | PRN

## 2018-04-12 MED ORDER — OXYCODONE HCL 5 MG PO TABS: 5.0000 mg | ORAL_TABLET | Freq: Four times a day (QID) | ORAL | 0 refills | Status: DC | PRN

## 2018-04-12 NOTE — Progress Notes (Signed)
Physical Therapy Treatment Patient Details Name: Brittany Stark MRN: [id number] DOB: 02/07/1946 Today's Date: 04/12/2018    History of Present Illness s/p L TKA on 04/11/18. Pt with PMH of HTN, GERD, depression, and arthritis. Surgical history includes R TKA in 10/2016, ORIF for periprosthetic patellar fracture and patellar tendon rupturee in 12/2016, and cataracts.     PT Comments    Pt with improved bed mobility and transfer skills this afternoon. Pt discontinued the need for knee immobilizer due to knee stability in standing and ability to do L SLR. Pt instructed in HEP and pt agrees she understands how to perform exercises for rest of acute stay and when returning home. Pt to possibly d/c today.    Follow Up Recommendations  Outpatient PT(MD recommends OPPT at [facility] )     Equipment Recommendations  None recommended by PT    Recommendations for Other Services       Precautions / Restrictions Precautions Precautions: Fall;Knee Required Braces or Orthoses: (Pt performed SLR x 6 and pt had no knee buckling when standing and walking without immobilizer ) Restrictions Weight Bearing Restrictions: No Other Position/Activity Restrictions: WBAT     Mobility  Bed Mobility Overal bed mobility: Needs Assistance Bed Mobility: Supine to Sit     Supine to sit: Min guard     General bed mobility comments: verbal cuing for scooting to EOB   Transfers Overall transfer level: Needs assistance Equipment used: Rolling walker (2 wheeled) Transfers: Sit to/from Stand Sit to Stand: Min assist         General transfer comment: physical assist for trunk elevation, steadying upon standing, and hip extension to reach full upright standing. Verbal cuing for hand placement on RW and bed.   Ambulation/Gait Ambulation/Gait assistance: Min guard Gait Distance (Feet): 50 Feet Assistive device: Rolling walker (2 wheeled) Gait Pattern/deviations: Step-through pattern;Decreased step length  - right;Decreased step length - left;Decreased stance time - left Gait velocity: decreased    General Gait Details: Verbal cues for placement in RW due to pushing RW too far in front of her and for trunk extension    Stairs             Wheelchair Mobility    Modified Rankin (Stroke Patients Only)       Balance Overall balance assessment: Mild deficits observed, not formally tested                                          Cognition Arousal/Alertness: Awake/alert Behavior During Therapy: WFL for tasks assessed/performed Overall Cognitive Status: Within Functional Limits for tasks assessed                                        Exercises Total Joint Exercises Ankle Circles/Pumps: AROM;Both;Supine;20 reps Quad Sets: AROM;10 reps;Supine;Left Heel Slides: AAROM;10 reps;Left;Supine Straight Leg Raises: AAROM;Left;5 reps;Supine Goniometric ROM: approximately 35 degrees AAROM knee flexion, lacking approximately 10 degrees knee extension     General Comments        Pertinent Vitals/Pain Pain Assessment: 0-10 Pain Score: 6  Pain Descriptors / Indicators: Sore;Grimacing;Guarding Pain Intervention(s): Limited activity within patient's tolerance;Repositioned;Monitored during session;Ice applied    Home Living  Prior Function            PT Goals (current goals can now be found in the care plan section) Acute Rehab PT Goals Patient Stated Goal: Progress with ambulation and exercises  PT Goal Formulation: With patient Time For Goal Achievement: 04/19/18 Potential to Achieve Goals: Good Progress towards PT goals: Progressing toward goals    Frequency    7X/week      PT Plan Current plan remains appropriate    Co-evaluation              AM-PAC PT "6 Clicks" Daily Activity  Outcome Measure  Difficulty turning over in bed (including adjusting bedclothes, sheets and blankets)?: A  Little Difficulty moving from lying on back to sitting on the side of the bed? : A Little Difficulty sitting down on and standing up from a chair with arms (e.g., wheelchair, bedside commode, etc,.)?: A Little Help needed moving to and from a bed to chair (including a wheelchair)?: A Little Help needed walking in hospital room?: A Little Help needed climbing 3-5 steps with a railing? : A Lot 6 Click Score: 17    End of Session Equipment Utilized During Treatment: Gait belt Activity Tolerance: Patient tolerated treatment well;Patient limited by pain;Patient limited by fatigue Patient left: with call bell/phone within reach;in bed;with bed alarm set(chair alarm turned on by PT tech ) Nurse Communication: Mobility status PT Visit Diagnosis: Muscle weakness (generalized) (M62.81);Difficulty in walking, not elsewhere classified (R26.2)     Time: 3818-2993 PT Time Calculation (min) (ACUTE ONLY): 28 min  Charges:  $Gait Training: 8-22 mins $Therapeutic Exercise: 8-22 mins                     Yeraldine Forney Conception Chancy, PT, DPT Pager # [phone]   [place] 04/12/2018, 5:01 PM

## 2018-04-12 NOTE — Progress Notes (Signed)
   Subjective: 1 Day Post-Op Procedure(s) (LRB): LEFT TOTAL KNEE ARTHROPLASTY (Left) Patient reports pain as mild.   Patient seen in rounds with Dr. Wynelle Link. Patient is well, and has had no acute complaints or problems other than pain in the left knee. Foley catheter removed this AM. Denies chest pain, SOB, or calf pain. We will start therapy today.   Objective: Vital signs in last 24 hours: Temp:  [97.3 F (36.3 C)-98.3 F (36.8 C)] 97.6 F (36.4 C) (07/30 0551) Pulse Rate:  [54-73] 66 (07/30 0551) Resp:  [8-17] 16 (07/30 0551) BP: (103-145)/(64-87) 139/84 (07/30 0551) SpO2:  [93 %-100 %] 100 % (07/30 0551) Weight:  [86.2 kg (190 lb)] 86.2 kg (190 lb) (07/29 0907)  Intake/Output from previous day:  Intake/Output Summary (Last 24 hours) at 04/12/2018 0703 Last data filed at 04/12/2018 0610 Gross per 24 hour  Intake 4022.5 ml  Output 2470 ml  Net 1552.5 ml     Labs: Recent Labs    04/12/18 0539  HGB 10.5*   Recent Labs    04/12/18 0539  WBC 6.6  RBC 3.40*  HCT 33.1*  PLT 215   Recent Labs    04/12/18 0539  NA 141  K 3.8  CL 106  CO2 28  BUN 12  CREATININE 0.62  GLUCOSE 128*  CALCIUM 8.3*   Exam: General - Patient is Alert and Oriented Extremity - Neurologically intact Neurovascular intact Sensation intact distally Dorsiflexion/Plantar flexion intact Dressing - dressing C/D/I Motor Function - intact, moving foot and toes well on exam.   Past Medical History:  Diagnosis Date  . Arthritis    rheumatoid and osteoarthritis   . Depression   . Elevated cholesterol    currently under control  . GERD (gastroesophageal reflux disease)    laryngopharyngeal reflux  . Hemorrhoids   . History of kidney stones    hx of  . Hypertension     Assessment/Plan: 1 Day Post-Op Procedure(s) (LRB): LEFT TOTAL KNEE ARTHROPLASTY (Left) Active Problems:   OA (osteoarthritis) of knee  Estimated body mass index is 28.06 kg/m as calculated from the following:  Height as of this encounter: 5\' 9"  (1.753 m).   Weight as of this encounter: 86.2 kg (190 lb). Advance diet Up with therapy D/C IV fluids  Anticipated LOS equal to or greater than 2 midnights due to - Age 71 and older with one or more of the following:  - Obesity  - Expected need for hospital services (PT, OT, Nursing) required for safe  discharge  - Anticipated need for postoperative skilled nursing care or inpatient rehab  - Active co-morbidities: None OR   - Unanticipated findings during/Post Surgery: None  - Patient is a high risk of re-admission due to: None    DVT Prophylaxis - Aspirin Weight bearing as tolerated. D/C O2 and pulse ox and try on room air. Hemovac pulled without difficulty, will begin therapy today.  Plan is to go Home after hospital stay. Possible discharge this afternoon if progresses with therapy and meeting goals. Scheduled for outpatient PT at [facility]. Follow-up in the office in 2 weeks with Dr. Wynelle Link.  Theresa Duty, PA-C Orthopedic Surgery 04/12/2018, 7:03 AM

## 2018-04-12 NOTE — Evaluation (Signed)
Physical Therapy Evaluation Patient Details Name: Brittany Stark MRN: [id number] DOB: January 07, 1946 Today's Date: 04/12/2018   History of Present Illness  s/p L TKA on 04/11/18. Pt with PMH of HTN, GERD, depression, and arthritis. Surgical history includes R TKA in 10/2016, ORIF for periprosthetic patellar fracture and patellar tendon rupturee in 12/2016, and cataracts.   Clinical Impression   Pt is a 72 YO female s/p L TKA on 04/11/18. Pt with above PMH. Pt presents with L knee pain especially with activity, decreased activity tolerance for exercises and ambulation, and L knee flexion/extension ROM deficits. Pt would benefit from acute PT to address these deficits. Pt has a good prognosis to progress mobility in acute setting due to past success with R TKA and motivation. Pt's plan is to follow up hospital stay with OPPT. Will continue to progress mobility as able.     Follow Up Recommendations Outpatient PT(MD recommends OPPT at [facility] )    Equipment Recommendations  None recommended by PT    Recommendations for Other Services       Precautions / Restrictions Precautions Precautions: Fall;Knee Required Braces or Orthoses: Knee Immobilizer - Left Knee Immobilizer - Left: Discontinue once straight leg raise with < 10 degree lag Restrictions Weight Bearing Restrictions: No Other Position/Activity Restrictions: WBAT       Mobility  Bed Mobility Overal bed mobility: Needs Assistance Bed Mobility: Supine to Sit     Supine to sit: Min assist     General bed mobility comments: Min assist for LLE management when scooting to EOB.   Transfers Overall transfer level: Needs assistance Equipment used: Rolling walker (2 wheeled) Transfers: Sit to/from Stand Sit to Stand: Mod assist         General transfer comment: physical assist for trunk elevation, steadying upon standing, and hip extension to reach full upright standing. Verbal cuing for hand placement on RW and bed.    Ambulation/Gait Ambulation/Gait assistance: Min guard Gait Distance (Feet): 40 Feet Assistive device: Rolling walker (2 wheeled) Gait Pattern/deviations: Step-through pattern;Decreased step length - right;Decreased step length - left;Decreased stance time - left Gait velocity: decreased    General Gait Details: Verbal cues for placement in RW, trunk extension   Stairs            Wheelchair Mobility    Modified Rankin (Stroke Patients Only)       Balance Overall balance assessment: Mild deficits observed, not formally tested                                           Pertinent Vitals/Pain Pain Assessment: 0-10 Pain Score: 8 (pain was 0 at rest, 8 with ambulation ) Pain Location: L knee  Pain Descriptors / Indicators: Aching;Grimacing;Guarding Pain Intervention(s): Limited activity within patient's tolerance;Ice applied;Monitored during session    [place] expects to be discharged to:: Private residence Living Arrangements: Spouse/significant other Available Help at Discharge: Family;Available 24 hours/day Type of Home: Apartment Home Access: Level entry     Home Layout: One level Home Equipment: Tub bench;Walker - 2 wheels;Cane - single point Additional Comments: has leg lifter as well     Prior Function Level of Independence: Independent         Comments: only utilized ADs after R TKA for 1-2 weeks post-operatively      Hand Dominance        Extremity/Trunk Assessment  Upper Extremity Assessment Upper Extremity Assessment: Overall WFL for tasks assessed    Lower Extremity Assessment Lower Extremity Assessment: LLE deficits/detail LLE Deficits / Details: Increased time for moving LLE and bed mobility tasks. Decreased L quad activation, demonstrated in quad sets. Able to complete 10 ankle pumps bilat. LLE: Unable to fully assess due to pain       Communication   Communication: No difficulties  Cognition  Arousal/Alertness: Awake/alert Behavior During Therapy: WFL for tasks assessed/performed Overall Cognitive Status: Within Functional Limits for tasks assessed                                        General Comments      Exercises Total Joint Exercises Ankle Circles/Pumps: AROM;10 reps;Both;Supine Quad Sets: AROM;10 reps;Both;Supine Goniometric ROM: 35 degrees AAROM L knee flexion    Assessment/Plan    PT Assessment Patient needs continued PT services  PT Problem List Decreased strength;Decreased coordination;Pain;Decreased range of motion;Decreased activity tolerance;Decreased knowledge of use of DME;Decreased balance;Decreased mobility       PT Treatment Interventions DME instruction;Therapeutic activities;Gait training;Therapeutic exercise;Functional mobility training;Patient/family education    PT Goals (Current goals can be found in the Care Plan section)  Acute Rehab PT Goals Patient Stated Goal: Progress with ambulation and exercises  PT Goal Formulation: With patient Time For Goal Achievement: 04/19/18 Potential to Achieve Goals: Good    Frequency 7X/week   Barriers to discharge        Co-evaluation               AM-PAC PT "6 Clicks" Daily Activity  Outcome Measure Difficulty turning over in bed (including adjusting bedclothes, sheets and blankets)?: A Little Difficulty moving from lying on back to sitting on the side of the bed? : Unable Difficulty sitting down on and standing up from a chair with arms (e.g., wheelchair, bedside commode, etc,.)?: Unable Help needed moving to and from a bed to chair (including a wheelchair)?: A Lot Help needed walking in hospital room?: A Little Help needed climbing 3-5 steps with a railing? : A Lot 6 Click Score: 12    End of Session Equipment Utilized During Treatment: Gait belt;Left knee immobilizer Activity Tolerance: Patient tolerated treatment well;Patient limited by pain;Patient limited by  fatigue Patient left: in chair;with chair alarm set;with call bell/phone within reach(chair alarm turned on by PT tech ) Nurse Communication: Mobility status PT Visit Diagnosis: Muscle weakness (generalized) (M62.81);Difficulty in walking, not elsewhere classified (R26.2)    Time: 0947-0962 PT Time Calculation (min) (ACUTE ONLY): 28 min   Charges:   PT Evaluation $PT Eval Low Complexity: 1 Low PT Treatments $Gait Training: 8-22 mins        Brittany Stark Conception Chancy, PT, DPT Pager # [phone]  Antrone [place] D Stylianos Stradling 04/12/2018, 1:40 PM

## 2018-04-12 NOTE — Progress Notes (Signed)
Spoke with patient at bedside. Confirmed plan for OP PT, already arranged. Has RW and 3n1. [phone] 

## 2018-04-14 NOTE — Discharge Summary (Signed)
Physician Discharge Summary   Patient ID: Brittany Stark MRN: [id number] DOB/AGE: May 18, 1946 72 y.o.  Admit date: 04/11/2018 Discharge date: 04/12/2018  Primary Diagnosis: Osteoarthritis left knee   Admission Diagnoses:  Past Medical History:  Diagnosis Date  . Arthritis    rheumatoid and osteoarthritis   . Depression   . Elevated cholesterol    currently under control  . GERD (gastroesophageal reflux disease)    laryngopharyngeal reflux  . Hemorrhoids   . History of kidney stones    hx of  . Hypertension    Discharge Diagnoses:   Active Problems:   OA (osteoarthritis) of knee  Estimated body mass index is 28.06 kg/m as calculated from the following:   Height as of this encounter: _0  (1.753 m).   Weight as of this encounter: 86.2 kg (190 lb).  Procedure:  Procedure(s) (LRB): LEFT TOTAL KNEE ARTHROPLASTY (Left)   Consults: None  HPI: Brittany Stark is a 72 y.o. year old female with end stage OA of her left knee with progressively worsening pain and dysfunction. She has constant pain, with activity and at rest and significant functional deficits with difficulties even with ADLs. She has had extensive non-op management including analgesics, injections of cortisone and viscosupplements, and home exercise program, but remains in significant pain with significant dysfunction. Radiographs show bone on bone arthritis lateral and patellofemoral. She presents now for left Total Knee Arthroplasty.    Laboratory Data: Admission on 04/11/2018, Discharged on 04/12/2018  Component Date Value Ref Range Status  . WBC 04/12/2018 6.6  4.0 - 10.5 K/uL Final  . RBC 04/12/2018 3.40* 3.87 - 5.11 MIL/uL Final  . Hemoglobin 04/12/2018 10.5* 12.0 - 15.0 g/dL Final  . HCT 04/12/2018 33.1* 36.0 - 46.0 % Final  . MCV 04/12/2018 97.4  78.0 - 100.0 fL Final  . MCH 04/12/2018 30.9  26.0 - 34.0 pg Final  . MCHC 04/12/2018 31.7  30.0 - 36.0 g/dL Final  . RDW 04/12/2018 15.5  11.5 - 15.5 % Final    . Platelets 04/12/2018 215  150 - 400 K/uL Final   Performed at [facility], [place] [address] [place], [place] [address]  . Sodium 04/12/2018 141  135 - 145 mmol/L Final  . Potassium 04/12/2018 3.8  3.5 - 5.1 mmol/L Final  . Chloride 04/12/2018 106  98 - 111 mmol/L Final  . CO2 04/12/2018 28  22 - 32 mmol/L Final  . Glucose, Bld 04/12/2018 128* 70 - 99 mg/dL Final  . BUN 04/12/2018 12  8 - 23 mg/dL Final  . Creatinine, Ser 04/12/2018 0.62  0.44 - 1.00 mg/dL Final  . Calcium 04/12/2018 8.3* 8.9 - 10.3 mg/dL Final  . GFR calc non Af Amer 04/12/2018 >60  >60 mL/min Final  . GFR calc Af Amer 04/12/2018 >60  >60 mL/min Final   Comment: (NOTE) The eGFR has been calculated using the CKD EPI equation. This calculation has not been validated in all clinical situations. eGFR's persistently <60 mL/min signify possible Chronic Kidney Disease.   Georgiann Hahn gap 04/12/2018 7  5 - 15 Final   Performed at [facility], [place] [address], [place], [place] [address]  . Glucose-Capillary 04/11/2018 122* 70 - 99 mg/dL Final  Hospital Outpatient Visit on 04/05/2018  Component Date Value Ref Range Status  . MRSA, PCR 04/05/2018 NEGATIVE  NEGATIVE Final  . Staphylococcus aureus 04/05/2018 POSITIVE* NEGATIVE Final   Comment: (NOTE) The Xpert SA Assay (FDA approved for NASAL specimens  in patients 72 years of age and older), is one component of a comprehensive surveillance program. It is not intended to diagnose infection nor to guide or monitor treatment. Performed at [facility], [place] [address] [place], [place] [address]   . aPTT 04/05/2018 29  24 - 36 seconds Final   Performed at [facility], [place] [address], [place], [place] [address]  . WBC 04/05/2018 3.4* 4.0 - 10.5 K/uL Final  . RBC 04/05/2018 3.47* 3.87 - 5.11 MIL/uL Final  . Hemoglobin 04/05/2018 10.7* 12.0 - 15.0 g/dL Final  . HCT 04/05/2018 33.8* 36.0 - 46.0 % Final  .  MCV 04/05/2018 97.4  78.0 - 100.0 fL Final  . MCH 04/05/2018 30.8  26.0 - 34.0 pg Final  . MCHC 04/05/2018 31.7  30.0 - 36.0 g/dL Final  . RDW 04/05/2018 15.5  11.5 - 15.5 % Final  . Platelets 04/05/2018 235  150 - 400 K/uL Final   Performed at [facility], [place] [address] [place], [place] [address]  . Sodium 04/05/2018 145  135 - 145 mmol/L Final  . Potassium 04/05/2018 3.2* 3.5 - 5.1 mmol/L Final  . Chloride 04/05/2018 109  98 - 111 mmol/L Final  . CO2 04/05/2018 30  22 - 32 mmol/L Final  . Glucose, Bld 04/05/2018 89  70 - 99 mg/dL Final  . BUN 04/05/2018 11  8 - 23 mg/dL Final  . Creatinine, Ser 04/05/2018 0.73  0.44 - 1.00 mg/dL Final  . Calcium 04/05/2018 8.9  8.9 - 10.3 mg/dL Final  . Total Protein 04/05/2018 7.8  6.5 - 8.1 g/dL Final  . Albumin 04/05/2018 3.4* 3.5 - 5.0 g/dL Final  . AST 04/05/2018 25  15 - 41 U/L Final  . ALT 04/05/2018 21  0 - 44 U/L Final  . Alkaline Phosphatase 04/05/2018 91  38 - 126 U/L Final  . Total Bilirubin 04/05/2018 0.7  0.3 - 1.2 mg/dL Final  . GFR calc non Af Amer 04/05/2018 >60  >60 mL/min Final  . GFR calc Af Amer 04/05/2018 >60  >60 mL/min Final   Comment: (NOTE) The eGFR has been calculated using the CKD EPI equation. This calculation has not been validated in all clinical situations. eGFR's persistently <60 mL/min signify possible Chronic Kidney Disease.   Georgiann Hahn gap 04/05/2018 6  5 - 15 Final   Performed at [facility], [place] [address], [place], [place] [address]  . Prothrombin Time 04/05/2018 12.6  11.4 - 15.2 seconds Final  . INR 04/05/2018 0.95   Final   Performed at [place] [address] [place], [place] [address]  . ABO/RH(D) 04/05/2018 A POS   Final  . Antibody Screen 04/05/2018 NEG   Final  . Sample Expiration 04/05/2018 04/14/2018   Final  . Extend sample reason 04/05/2018    Final                   Value:NO TRANSFUSIONS OR PREGNANCY IN THE PAST 3  MONTHS Performed at [facility], [place], [place] [address], [place],  [address]   . Color, Urine 04/05/2018 YELLOW  YELLOW Final  . APPearance 04/05/2018 CLEAR  CLEAR Final  . Specific Gravity, Urine 04/05/2018 1.010  1.005 - 1.030 Final  . pH 04/05/2018 7.5  5.0 - 8.0 Final  . Glucose, UA 04/05/2018 NEGATIVE  NEGATIVE mg/dL Final  . Hgb urine dipstick 04/05/2018 SMALL* NEGATIVE Final  . Bilirubin Urine 04/05/2018 NEGATIVE  NEGATIVE Final  . Ketones, ur 04/05/2018 NEGATIVE  NEGATIVE mg/dL Final  . Protein, ur 04/05/2018 NEGATIVE  NEGATIVE mg/dL Final  . Nitrite 04/05/2018 NEGATIVE  NEGATIVE Final  . Leukocytes, UA 04/05/2018 NEGATIVE  NEGATIVE Final   Performed at [place] [address], [place], [place] [address]  . RBC / HPF 04/05/2018 0-5  0 - 5 RBC/hpf Final  . WBC, UA 04/05/2018 0-5  0 - 5 WBC/hpf Final  . Bacteria, UA 04/05/2018 RARE* NONE SEEN Final  . Squamous Epithelial / LPF 04/05/2018 0-5  0 - 5 Final   Performed at [facility], [place] [address], [place], [place] [address]     X-Rays:No results found.  EKG: Orders placed or performed during the hospital encounter of 04/05/18  . EKG 12 lead  . EKG 12 lead     Hospital Course: SHAWONDA KERCE is a 72 y.o. who was admitted to [facility]. They were brought to the operating room on 04/11/2018 and underwent Procedure(s): LEFT TOTAL KNEE ARTHROPLASTY.  Patient tolerated the procedure well and was later transferred to the recovery room and then to the orthopaedic floor for postoperative care.  They were given PO and IV analgesics for pain control following their surgery.  They were given 24 hours of postoperative antibiotics of  Anti-infectives (From admission, onward)   Start     Dose/Rate Route Frequency Ordered Stop   04/11/18 1730  ceFAZolin (ANCEF) IVPB 2g/100 mL premix     2 g 200 mL/hr over 30 Minutes Intravenous Every 6 hours 04/11/18 1452 04/11/18 2303    04/11/18 0845  ceFAZolin (ANCEF) IVPB 2g/100 mL premix     2 g 200 mL/hr over 30 Minutes Intravenous On call to O.R. 04/11/18 4492 04/11/18 1206     and started on DVT prophylaxis in the form of Aspirin.   PT and OT were ordered for total joint protocol.  Discharge planning consulted to help with postop disposition and equipment needs.  Patient had a good night on the evening of surgery. She started to get up OOB with therapy on POD #1. Pt was seen during rounds and was ready to go home pending progress with therapy. Hemovac drain was pulled without difficulty. She completed two sessions of physical therapy and was meeting her goals. She was discharged to home later that day in stable condition.   Diet: Regular diet Activity:WBAT Follow-up:in 2 weeks with Dr. Wynelle Link Disposition - Home with outpatient physical therapy at [facility] Discharged Condition: stable   Discharge Instructions    Call MD / Call 911   Complete by:  As directed    If you experience chest pain or shortness of breath, CALL 911 and be transported to the hospital emergency room.  If you develope a fever above 101 F, pus (white drainage) or increased drainage or redness at the wound, or calf pain, call your surgeon's office.   Change dressing   Complete by:  As directed    Change dressing on Wednesday (04/13/2018), then change the dressing daily with sterile 4 x 4 inch gauze dressing and apply TED hose.   Constipation Prevention   Complete by:  As directed    Drink plenty of fluids.  Prune juice may be helpful.  You may use a stool softener, such as Colace (over the counter) 100 mg twice a day.  Use MiraLax (over the counter) for constipation as needed.   Diet general   Complete by:  As  directed    Discharge instructions   Complete by:  As directed    Dr. Gaynelle Arabian Total Joint Specialist [facility] [address], [place], [place] [address] [phone]  TOTAL KNEE REPLACEMENT POSTOPERATIVE  DIRECTIONS  Knee Rehabilitation, Guidelines Following Surgery  Results after knee surgery are often greatly improved when you follow the exercise, range of motion and muscle strengthening exercises prescribed by your doctor. Safety measures are also important to protect the knee from further injury. Any time any of these exercises cause you to have increased pain or swelling in your knee joint, decrease the amount until you are comfortable again and slowly increase them. If you have problems or questions, call your caregiver or physical therapist for advice.   HOME CARE INSTRUCTIONS  Remove items at home which could result in a fall. This includes throw rugs or furniture in walking pathways.  ICE to the affected knee every three hours for 30 minutes at a time and then as needed for pain and swelling.  Continue to use ice on the knee for pain and swelling from surgery. You may notice swelling that will progress down to the foot and ankle.  This is normal after surgery.  Elevate the leg when you are not up walking on it.   Continue to use the breathing machine which will help keep your temperature down.  It is common for your temperature to cycle up and down following surgery, especially at night when you are not up moving around and exerting yourself.  The breathing machine keeps your lungs expanded and your temperature down. Do not place pillow under knee, focus on keeping the knee straight while resting   DIET You may resume your previous home diet once your are discharged from the hospital.  DRESSING / WOUND CARE / SHOWERING You may shower 3 days after surgery, but keep the wounds dry during showering.  You may use an occlusive plastic wrap (Press'n Seal for example), NO SOAKING/SUBMERGING IN THE BATHTUB.  If the bandage gets wet, change with a clean dry gauze.  If the incision gets wet, pat the wound dry with a clean towel. You may start showering once you are discharged home but do not submerge  the incision under water. Just pat the incision dry and apply a dry gauze dressing on daily. Change the surgical dressing daily and reapply a dry dressing each time.  ACTIVITY Walk with your walker as instructed. Use walker as long as suggested by your caregivers. Avoid periods of inactivity such as sitting longer than an hour when not asleep. This helps prevent blood clots.  You may resume a sexual relationship in one month or when given the OK by your doctor.  You may return to work once you are cleared by your doctor.  Do not drive a car for 6 weeks or until released by you surgeon.  Do not drive while taking narcotics.  WEIGHT BEARING Weight bearing as tolerated with assist device (walker, cane, etc) as directed, use it as long as suggested by your surgeon or therapist, typically at least 4-6 weeks.  POSTOPERATIVE CONSTIPATION PROTOCOL Constipation - defined medically as fewer than three stools per week and severe constipation as less than one stool per week.  One of the most common issues patients have following surgery is constipation.  Even if you have a regular bowel pattern at home, your normal regimen is likely to be disrupted due to multiple reasons following surgery.  Combination of anesthesia,  postoperative narcotics, change in appetite and fluid intake all can affect your bowels.  In order to avoid complications following surgery, here are some recommendations in order to help you during your recovery period.  Colace (docusate) - Pick up an over-the-counter form of Colace or another stool softener and take twice a day as long as you are requiring postoperative pain medications.  Take with a full glass of water daily.  If you experience loose stools or diarrhea, hold the colace until you stool forms back up.  If your symptoms do not get better within 1 week or if they get worse, check with your doctor.  Dulcolax (bisacodyl) - Pick up over-the-counter and take as directed by the  product packaging as needed to assist with the movement of your bowels.  Take with a full glass of water.  Use this product as needed if not relieved by Colace only.   MiraLax (polyethylene glycol) - Pick up over-the-counter to have on hand.  MiraLax is a solution that will increase the amount of water in your bowels to assist with bowel movements.  Take as directed and can mix with a glass of water, juice, soda, coffee, or tea.  Take if you go more than two days without a movement. Do not use MiraLax more than once per day. Call your doctor if you are still constipated or irregular after using this medication for 7 days in a row.  If you continue to have problems with postoperative constipation, please contact the office for further assistance and recommendations.  If you experience "the worst abdominal pain ever" or develop nausea or vomiting, please contact the office immediatly for further recommendations for treatment.  ITCHING  If you experience itching with your medications, try taking only a single pain pill, or even half a pain pill at a time.  You can also use Benadryl over the counter for itching or also to help with sleep.   TED HOSE STOCKINGS Wear the elastic stockings on both legs for three weeks following surgery during the day but you may remove then at night for sleeping.  MEDICATIONS See your medication summary on the "After Visit Summary" that the nursing staff will review with you prior to discharge.  You may have some home medications which will be placed on hold until you complete the course of blood thinner medication.  It is important for you to complete the blood thinner medication as prescribed by your surgeon.  Continue your approved medications as instructed at time of discharge.  PRECAUTIONS If you experience chest pain or shortness of breath - call 911 immediately for transfer to the hospital emergency department.  If you develop a fever greater that 101 F, purulent  drainage from wound, increased redness or drainage from wound, foul odor from the wound/dressing, or calf pain - CONTACT YOUR SURGEON.                                                   FOLLOW-UP APPOINTMENTS Make sure you keep all of your appointments after your operation with your surgeon and caregivers. You should call the office at the above phone number and make an appointment for approximately two weeks after the date of your surgery or on the date instructed by your surgeon outlined in the "After Visit Summary".   RANGE OF MOTION AND  STRENGTHENING EXERCISES  Rehabilitation of the knee is important following a knee injury or an operation. After just a few days of immobilization, the muscles of the thigh which control the knee become weakened and shrink (atrophy). Knee exercises are designed to build up the tone and strength of the thigh muscles and to improve knee motion. Often times heat used for twenty to thirty minutes before working out will loosen up your tissues and help with improving the range of motion but do not use heat for the first two weeks following surgery. These exercises can be done on a training (exercise) mat, on the floor, on a table or on a bed. Use what ever works the best and is most comfortable for you Knee exercises include:  Leg Lifts - While your knee is still immobilized in a splint or cast, you can do straight leg raises. Lift the leg to 60 degrees, hold for 3 sec, and slowly lower the leg. Repeat 10-20 times 2-3 times daily. Perform this exercise against resistance later as your knee gets better.  Quad and Hamstring Sets - Tighten up the muscle on the front of the thigh (Quad) and hold for 5-10 sec. Repeat this 10-20 times hourly. Hamstring sets are done by pushing the foot backward against an object and holding for 5-10 sec. Repeat as with quad sets.  Leg Slides: Lying on your back, slowly slide your foot toward your buttocks, bending your knee up off the floor (only go  as far as is comfortable). Then slowly slide your foot back down until your leg is flat on the floor again. Angel Wings: Lying on your back spread your legs to the side as far apart as you can without causing discomfort.  A rehabilitation program following serious knee injuries can speed recovery and prevent re-injury in the future due to weakened muscles. Contact your doctor or a physical therapist for more information on knee rehabilitation.   IF YOU ARE TRANSFERRED TO A SKILLED REHAB FACILITY If the patient is transferred to a skilled rehab facility following release from the hospital, a list of the current medications will be sent to the facility for the patient to continue.  When discharged from the skilled rehab facility, please have the facility set up the patient's [place] prior to being released. Also, the skilled facility will be responsible for providing the patient with their medications at time of release from the facility to include their pain medication, the muscle relaxants, and their blood thinner medication. If the patient is still at the rehab facility at time of the two week follow up appointment, the skilled rehab facility will also need to assist the patient in arranging follow up appointment in our office and any transportation needs.  MAKE SURE YOU:  Understand these instructions.  Get help right away if you are not doing well or get worse.    Pick up stool softner and laxative for home use following surgery while on pain medications. Do not submerge incision under water. Please use good hand washing techniques while changing dressing each day. May shower starting three days after surgery. Please use a clean towel to pat the incision dry following showers. Continue to use ice for pain and swelling after surgery. Do not use any lotions or creams on the incision until instructed by your surgeon.   Do not put a pillow under the knee. Place it under the  heel.   Complete by:  As directed  Driving restrictions   Complete by:  As directed    No driving for two weeks   TED hose   Complete by:  As directed    Use stockings (TED hose) for three weeks on both leg(s).  You may remove them at night for sleeping.   Weight bearing as tolerated   Complete by:  As directed      Allergies as of 04/12/2018      Reactions   Amoxicillin Other (See Comments)   Yeast infection  Has patient had a PCN reaction causing immediate rash, facial/tongue/throat swelling, SOB or lightheadedness with hypotension: No Has patient had a PCN reaction causing severe rash involving mucus membranes or skin necrosis: No Has patient had a PCN reaction that required hospitalization No Has patient had a PCN reaction occurring within the last 10 years: No If all of the above answers are "NO", then may proceed with Cephalosporin use.   Arava [leflunomide] Other (See Comments)   hairloss    Doxycycline Itching, Other (See Comments)   Eyes red and tearing    Methotrexate Derivatives Other (See Comments)   The injectable causes skin lesions       Medication List    TAKE these medications   alendronate 70 MG tablet Commonly known as:  FOSAMAX TK 1 T PO ONCE A WK FOR 28 DAYS   amLODipine 2.5 MG tablet Commonly known as:  NORVASC Take 2.5 mg by mouth daily.   aspirin 325 MG EC tablet Take 1 tablet (325 mg total) by mouth 2 (two) times daily for 20 days. Take one tablet (325 mg) Aspirin two times a day for three weeks following surgery. Then resume one baby Aspirin (81 mg) once a day. What changed:    medication strength  how much to take  when to take this  additional instructions  Another medication with the same name was removed. Continue taking this medication, and follow the directions you see here.   atorvastatin 40 MG tablet Commonly known as:  LIPITOR TK 1 T PO QD   BIOTIN PO Take 1 capsule by mouth daily.   hydrochlorothiazide 25 MG  tablet Commonly known as:  HYDRODIURIL TK 1 T PO QOD   iron polysaccharides 150 MG capsule Commonly known as:  NIFEREX Take 1 capsule (150 mg total) by mouth 2 (two) times daily.   methocarbamol 500 MG tablet Commonly known as:  ROBAXIN Take 1 tablet (500 mg total) by mouth every 6 (six) hours as needed for muscle spasms.   omeprazole 20 MG capsule Commonly known as:  PRILOSEC TAKE 1 CAPSULE(20 MG) BY MOUTH DAILY   oxybutynin 10 MG 24 hr tablet Commonly known as:  DITROPAN-XL Take 10 mg by mouth daily.   oxyCODONE 5 MG immediate release tablet Commonly known as:  Oxy IR/ROXICODONE Take 1-2 tablets (5-10 mg total) by mouth every 6 (six) hours as needed for moderate pain or severe pain. What changed:  when to take this   traMADol 50 MG tablet Commonly known as:  ULTRAM Take 1-2 tablets (50-100 mg total) by mouth every 6 (six) hours as needed for moderate pain (if oxycodone not effective). What changed:  reasons to take this   TRAVATAN Z 0.004 % Soln ophthalmic solution Generic drug:  Travoprost (BAK Free) INT 1 GTT IN OU HS   XELJANZ XR 11 MG Tb24 Generic drug:  Tofacitinib Citrate Take 1 tablet by mouth daily.            Discharge  Care Instructions  (From admission, onward)        Start     Ordered   04/12/18 0000  Weight bearing as tolerated     04/12/18 0709   04/12/18 0000  Change dressing    Comments:  Change dressing on Wednesday (04/13/2018), then change the dressing daily with sterile 4 x 4 inch gauze dressing and apply TED hose.   04/12/18 1188     Follow-up Information    Gaynelle Arabian, MD. Schedule an appointment as soon as possible for a visit on 04/26/2018.   Specialty:  Orthopedic Surgery Contact information: [address] [place] [address] [phone]           Signed: Theresa Duty, PA-C Orthopaedic Surgery 04/14/2018, 1:28 PM

## 2018-04-15 DIAGNOSIS — M25662 Stiffness of left knee, not elsewhere classified: Secondary | ICD-10-CM | POA: Diagnosis not present

## 2018-04-18 DIAGNOSIS — M25662 Stiffness of left knee, not elsewhere classified: Secondary | ICD-10-CM | POA: Diagnosis not present

## 2018-04-20 DIAGNOSIS — M25662 Stiffness of left knee, not elsewhere classified: Secondary | ICD-10-CM | POA: Diagnosis not present

## 2018-04-22 DIAGNOSIS — M25662 Stiffness of left knee, not elsewhere classified: Secondary | ICD-10-CM | POA: Diagnosis not present

## 2018-04-25 DIAGNOSIS — M25662 Stiffness of left knee, not elsewhere classified: Secondary | ICD-10-CM | POA: Diagnosis not present

## 2018-04-27 DIAGNOSIS — M25662 Stiffness of left knee, not elsewhere classified: Secondary | ICD-10-CM | POA: Diagnosis not present

## 2018-04-29 DIAGNOSIS — M25662 Stiffness of left knee, not elsewhere classified: Secondary | ICD-10-CM | POA: Diagnosis not present

## 2018-05-03 DIAGNOSIS — M25662 Stiffness of left knee, not elsewhere classified: Secondary | ICD-10-CM | POA: Diagnosis not present

## 2018-05-04 DIAGNOSIS — R300 Dysuria: Secondary | ICD-10-CM | POA: Diagnosis not present

## 2018-05-05 DIAGNOSIS — M25662 Stiffness of left knee, not elsewhere classified: Secondary | ICD-10-CM | POA: Diagnosis not present

## 2018-05-10 DIAGNOSIS — N390 Urinary tract infection, site not specified: Secondary | ICD-10-CM | POA: Diagnosis not present

## 2018-05-10 DIAGNOSIS — R300 Dysuria: Secondary | ICD-10-CM | POA: Diagnosis not present

## 2018-05-10 DIAGNOSIS — N9089 Other specified noninflammatory disorders of vulva and perineum: Secondary | ICD-10-CM | POA: Diagnosis not present

## 2018-05-12 DIAGNOSIS — M25662 Stiffness of left knee, not elsewhere classified: Secondary | ICD-10-CM | POA: Diagnosis not present

## 2018-05-17 DIAGNOSIS — M25662 Stiffness of left knee, not elsewhere classified: Secondary | ICD-10-CM | POA: Diagnosis not present

## 2018-05-17 DIAGNOSIS — Z96652 Presence of left artificial knee joint: Secondary | ICD-10-CM | POA: Diagnosis not present

## 2018-05-17 DIAGNOSIS — Z471 Aftercare following joint replacement surgery: Secondary | ICD-10-CM | POA: Diagnosis not present

## 2018-05-18 DIAGNOSIS — N9089 Other specified noninflammatory disorders of vulva and perineum: Secondary | ICD-10-CM | POA: Diagnosis not present

## 2018-05-18 DIAGNOSIS — Z1272 Encounter for screening for malignant neoplasm of vagina: Secondary | ICD-10-CM | POA: Diagnosis not present

## 2018-05-18 DIAGNOSIS — K5900 Constipation, unspecified: Secondary | ICD-10-CM | POA: Diagnosis not present

## 2018-05-18 DIAGNOSIS — Z90710 Acquired absence of both cervix and uterus: Secondary | ICD-10-CM | POA: Diagnosis not present

## 2018-05-19 DIAGNOSIS — M25662 Stiffness of left knee, not elsewhere classified: Secondary | ICD-10-CM | POA: Diagnosis not present

## 2018-06-13 ENCOUNTER — Emergency Department (HOSPITAL_COMMUNITY)
Admission: EM | Admit: 2018-06-13 | Discharge: 2018-06-13 | Disposition: A | Discharge status: Home / Self Care | Payer: Medicare HMO | Attending: Emergency Medicine | Admitting: Emergency Medicine

## 2018-06-13 ENCOUNTER — Other Ambulatory Visit: Payer: Self-pay

## 2018-06-13 ENCOUNTER — Encounter (HOSPITAL_COMMUNITY): Payer: Self-pay | Admitting: Emergency Medicine

## 2018-06-13 ENCOUNTER — Emergency Department (HOSPITAL_COMMUNITY): Payer: Medicare HMO

## 2018-06-13 DIAGNOSIS — R1030 Lower abdominal pain, unspecified: Secondary | ICD-10-CM | POA: Diagnosis not present

## 2018-06-13 DIAGNOSIS — Z79899 Other long term (current) drug therapy: Secondary | ICD-10-CM | POA: Diagnosis not present

## 2018-06-13 DIAGNOSIS — R1031 Right lower quadrant pain: Secondary | ICD-10-CM | POA: Insufficient documentation

## 2018-06-13 DIAGNOSIS — I10 Essential (primary) hypertension: Secondary | ICD-10-CM | POA: Insufficient documentation

## 2018-06-13 DIAGNOSIS — R1084 Generalized abdominal pain: Secondary | ICD-10-CM | POA: Diagnosis not present

## 2018-06-13 DIAGNOSIS — K5730 Diverticulosis of large intestine without perforation or abscess without bleeding: Secondary | ICD-10-CM | POA: Diagnosis not present

## 2018-06-13 DIAGNOSIS — R1032 Left lower quadrant pain: Secondary | ICD-10-CM | POA: Diagnosis not present

## 2018-06-13 DIAGNOSIS — K8020 Calculus of gallbladder without cholecystitis without obstruction: Secondary | ICD-10-CM | POA: Diagnosis not present

## 2018-06-13 LAB — COMPREHENSIVE METABOLIC PANEL
ALT: 18 U/L (ref 0–44)
AST: 25 U/L (ref 15–41)
Albumin: 3.7 g/dL (ref 3.5–5.0)
Alkaline Phosphatase: 97 U/L (ref 38–126)
Anion gap: 12 (ref 5–15)
BUN: 22 mg/dL (ref 8–23)
CHLORIDE: 107 mmol/L (ref 98–111)
CO2: 28 mmol/L (ref 22–32)
CREATININE: 1.03 mg/dL — AB (ref 0.44–1.00)
Calcium: 9.3 mg/dL (ref 8.9–10.3)
GFR calc Af Amer: >60 mL/min (ref >60)
GFR calc non Af Amer: 53 mL/min — ABNORMAL LOW (ref >60)
Glucose, Bld: 101 mg/dL — ABNORMAL HIGH (ref 70–99)
Potassium: 3.3 mmol/L — ABNORMAL LOW (ref 3.5–5.1)
SODIUM: 147 mmol/L — AB (ref 135–145)
Total Bilirubin: 0.6 mg/dL (ref 0.3–1.2)
Total Protein: 7.7 g/dL (ref 6.5–8.1)

## 2018-06-13 LAB — CBC WITH DIFFERENTIAL/PLATELET
Basophils Absolute: 0 10*3/uL (ref 0.0–0.1)
Basophils Relative: 0 %
EOS ABS: 0 10*3/uL (ref 0.0–0.7)
Eosinophils Relative: 1 %
HCT: 33.1 % — ABNORMAL LOW (ref 36.0–46.0)
Hemoglobin: 10.6 g/dL — ABNORMAL LOW (ref 12.0–15.0)
LYMPHS ABS: 1.6 10*3/uL (ref 0.7–4.0)
Lymphocytes Relative: 32 %
MCH: 31.3 pg (ref 26.0–34.0)
MCHC: 32 g/dL (ref 30.0–36.0)
MCV: 97.6 fL (ref 78.0–100.0)
MONOS PCT: 9 %
Monocytes Absolute: 0.4 10*3/uL (ref 0.1–1.0)
Neutro Abs: 3 10*3/uL (ref 1.7–7.7)
Neutrophils Relative %: 58 %
PLATELETS: 258 10*3/uL (ref 150–400)
RBC: 3.39 MIL/uL — ABNORMAL LOW (ref 3.87–5.11)
RDW: 15.6 % — ABNORMAL HIGH (ref 11.5–15.5)
WBC: 5 10*3/uL (ref 4.0–10.5)

## 2018-06-13 LAB — URINALYSIS, ROUTINE W REFLEX MICROSCOPIC
BILIRUBIN URINE: NEGATIVE
Glucose, UA: NEGATIVE mg/dL
HGB URINE DIPSTICK: NEGATIVE
Ketones, ur: NEGATIVE mg/dL
Leukocytes, UA: NEGATIVE
Nitrite: NEGATIVE
PROTEIN: NEGATIVE mg/dL
SPECIFIC GRAVITY, URINE: 1.028 (ref 1.005–1.030)
pH: 5 (ref 5.0–8.0)

## 2018-06-13 MED ORDER — IOPAMIDOL (ISOVUE-300) INJECTION 61%
100.0000 mL | Freq: Once | INTRAVENOUS | Status: AC | PRN
  Administered 2018-06-13: 100 mL via INTRAVENOUS

## 2018-06-13 MED ORDER — SODIUM CHLORIDE 0.9 % IJ SOLN
INTRAMUSCULAR | Status: AC
  Filled 2018-06-13: qty 50

## 2018-06-13 MED ORDER — IOPAMIDOL (ISOVUE-300) INJECTION 61%
INTRAVENOUS | Status: AC
  Filled 2018-06-13: qty 100

## 2018-06-13 MED ORDER — KETOROLAC TROMETHAMINE 30 MG/ML IJ SOLN
15.0000 mg | Freq: Once | INTRAMUSCULAR | Status: AC
  Administered 2018-06-13: 15 mg via INTRAMUSCULAR
  Filled 2018-06-13: qty 1

## 2018-06-13 NOTE — ED Triage Notes (Addendum)
Pt from home with severe lower abdominal pain. Only occurs with movement. No other symptoms.

## 2018-06-13 NOTE — ED Notes (Signed)
Patient transported to CT 

## 2018-06-13 NOTE — Discharge Instructions (Signed)
As discussed, today's evaluation has been largely reassuring, aside from demonstrating mild dehydration and some degenerative changes in your spine (and intervertebral discs).  Please use ibuprofen, 400mg , three times daily for three days.  Please be sure to follow-up with your physician.

## 2018-06-13 NOTE — ED Provider Notes (Signed)
[facility] DEPT Provider Note   CSN: [id number] Arrival date & time: 06/13/18  9024     History   Chief Complaint Chief Complaint  Patient presents with  . Abdominal Pain    HPI Brittany Stark is a 72 y.o. female.  HPI Presents with concern of suprapubic pain. Patient was in her usual state of health until she went asleep, and awoke with pain. Since this morning, about 3 hours ago the pain is been persistent, throughout the suprapubic region, worse with activity, ambulation, moving either leg. She denies nausea, vomiting, fever. She also has no recent diarrhea, no changes in bowel movements. Patient per chart review has a history of kidney stone, but does not, seemingly, recall this. Patient has frequent urination at baseline, cannot specify if she has increased frequency or not, but denies dysuria. No medication taken for relief. EMS providers note that the patient was hemodynamically unremarkable in route. Past Medical History:  Diagnosis Date  . Arthritis    rheumatoid and osteoarthritis   . Depression   . Elevated cholesterol    currently under control  . GERD (gastroesophageal reflux disease)    laryngopharyngeal reflux  . Hemorrhoids   . History of kidney stones    hx of  . Hypertension     Patient Active Problem List   Diagnosis Date Noted  . Peri-prosthetic patellar fracture 12/23/2016  . Patellar tendon rupture, right, subsequent encounter 12/23/2016  . OA (osteoarthritis) of knee 11/02/2016  . Hydronephrosis of right kidney 05/01/2014  . Cystitis 05/01/2014    Past Surgical History:  Procedure Laterality Date  . ABDOMINAL HYSTERECTOMY    . BREAST SURGERY     breast reduction   . CATARACT EXTRACTION     both eyes  . COLONOSCOPY     hx polyps  . CYSTOSCOPY W/ RETROGRADES Right 05/01/2014   Procedure: CYSTOSCOPY WITH RIGHT RETROGRADE PYELOGRAM;  Surgeon: Malka So, MD;  Location: [facility];  Service: Urology;   Laterality: Right;  . CYSTOSCOPY WITH BIOPSY  05/01/2014   Procedure: CYSTOSCOPY WITH bladder BIOPSY;  Surgeon: Malka So, MD;  Location: [facility];  Service: Urology;;  . KNEE ARTHROSCOPY  1995  . ORIF PATELLA Right 12/23/2016   Procedure: RIGHT PATELLA TENDON REPAIR;  Surgeon: Gaynelle Arabian, MD;  Location: [facility];  Service: Orthopedics;  Laterality: Right;  . right ankle surgery      has pins   . right arm surgery      pins in right arm   . TOTAL KNEE ARTHROPLASTY Right 11/02/2016   Procedure: RIGHT TOTAL KNEE ARTHROPLASTY;  Surgeon: Gaynelle Arabian, MD;  Location: [facility];  Service: Orthopedics;  Laterality: Right;  with abductor block  . TOTAL KNEE ARTHROPLASTY Left 04/11/2018   Procedure: LEFT TOTAL KNEE ARTHROPLASTY;  Surgeon: Gaynelle Arabian, MD;  Location: [facility];  Service: Orthopedics;  Laterality: Left;  block     OB History   None      Home Medications    Prior to Admission medications   Medication Sig Start Date End Date Taking? Authorizing Provider  amLODipine (NORVASC) 2.5 MG tablet Take 2.5 mg by mouth daily. 10/05/16  Yes [provider]  atorvastatin (LIPITOR) 40 MG tablet Take 40 mg by mouth daily at 6 PM.  01/10/18  Yes [provider]  BIOTIN PO Take 1 capsule by mouth daily.   Yes [provider]  TRAVATAN Z 0.004 % SOLN ophthalmic solution Place 1 drop into both  eyes at bedtime.  03/11/18  Yes [provider]  XELJANZ XR 11 MG TB24 Take 1 tablet by mouth daily.  03/30/18  Yes [provider]  iron polysaccharides (NIFEREX) 150 MG capsule Take 1 capsule (150 mg total) by mouth 2 (two) times daily. Patient not taking: Reported on 12/23/2016 11/04/16   Dara Lords, Alexzandrew L, PA-C  methocarbamol (ROBAXIN) 500 MG tablet Take 1 tablet (500 mg total) by mouth every 6 (six) hours as needed for muscle spasms. Patient not taking: Reported on 06/13/2018 04/12/18   Edmisten, Ok Anis, PA  omeprazole (PRILOSEC) 20 MG capsule TAKE 1 CAPSULE(20  MG) BY MOUTH DAILY Patient not taking: No sig reported 11/12/16   Irene Shipper, MD  oxyCODONE (OXY IR/ROXICODONE) 5 MG immediate release tablet Take 1-2 tablets (5-10 mg total) by mouth every 6 (six) hours as needed for moderate pain or severe pain. Patient not taking: Reported on 06/13/2018 04/12/18   Edmisten, Ok Anis, PA  traMADol (ULTRAM) 50 MG tablet Take 1-2 tablets (50-100 mg total) by mouth every 6 (six) hours as needed for moderate pain (if oxycodone not effective). Patient not taking: Reported on 06/13/2018 04/12/18   Derl Barrow, PA    Family History Family History  Problem Relation Age of Onset  . Breast cancer Maternal Aunt   . Heart disease Sister   . Breast cancer Mother   . Colon cancer Neg Hx   . Esophageal cancer Neg Hx   . Rectal cancer Neg Hx   . Stomach cancer Neg Hx     Social History Social History   Tobacco Use  . Smoking status: Never Smoker  . Smokeless tobacco: Never Used  Substance Use Topics  . Alcohol use: No    Alcohol/week: 0.0 standard drinks  . Drug use: No     Allergies   Amoxicillin; Arava [leflunomide]; Doxycycline; and Methotrexate derivatives   Review of Systems Review of Systems  Constitutional:       Per HPI, otherwise negative  HENT:       Per HPI, otherwise negative  Respiratory:       Per HPI, otherwise negative  Cardiovascular:       Per HPI, otherwise negative  Gastrointestinal: Negative for vomiting.  Endocrine:       Negative aside from HPI  Genitourinary:       Neg aside from HPI   Musculoskeletal:       Per HPI, otherwise negative  Skin: Negative.   Neurological: Negative for syncope.     Physical Exam Updated Vital Signs BP (!) 144/90   Pulse 66   Temp 97.7 F (36.5 C) (Oral)   Resp 17   SpO2 96%   Physical Exam  Constitutional: She is oriented to person, place, and time. She appears well-developed and well-nourished. No distress.  HENT:  Head: Normocephalic and atraumatic.  Eyes:  Conjunctivae and EOM are normal.  Cardiovascular: Normal rate and regular rhythm.  Pulmonary/Chest: Effort normal and breath sounds normal. No stridor. No respiratory distress.  Abdominal: She exhibits no distension. There is tenderness in the suprapubic area.  Musculoskeletal: She exhibits no edema.  Flexion of either hip results and pain in the suprapubic region, left greater than right.  No gross musculoskeletal deformities.  Neurological: She is alert and oriented to person, place, and time. No cranial nerve deficit.  Skin: Skin is warm and dry.  Psychiatric: She has a normal mood and affect.  Nursing note and vitals reviewed.  ED Treatments / Results  Labs (all labs ordered are listed, but only abnormal results are displayed) Labs Reviewed  COMPREHENSIVE METABOLIC PANEL - Abnormal; Notable for the following components:      Result Value   Sodium 147 (*)    Potassium 3.3 (*)    Glucose, Bld 101 (*)    Creatinine, Ser 1.03 (*)    GFR calc non Af Amer 53 (*)    All other components within normal limits  CBC WITH DIFFERENTIAL/PLATELET - Abnormal; Notable for the following components:   RBC 3.39 (*)    Hemoglobin 10.6 (*)    HCT 33.1 (*)    RDW 15.6 (*)    All other components within normal limits  URINALYSIS, ROUTINE W REFLEX MICROSCOPIC    EKG None  Radiology Ct Abdomen Pelvis W Contrast  Result Date: 06/13/2018 CLINICAL DATA:  Lower abdominal pain more on the left EXAM: CT ABDOMEN AND PELVIS WITH CONTRAST TECHNIQUE: Multidetector CT imaging of the abdomen and pelvis was performed using the standard protocol following bolus administration of intravenous contrast. CONTRAST:  157mL ISOVUE-300 IOPAMIDOL (ISOVUE-300) INJECTION 61% COMPARISON:  11/30/2017 FINDINGS: Lower chest: Mild bibasilar atelectasis. No focal infiltrate or effusion is noted. Moderate-sized hiatal hernia is seen. Hepatobiliary: Gallbladder is well distended with multiple gallstones within. The liver is  diffusely decreased in attenuation consistent with fatty infiltration. Pancreas: Unremarkable. No pancreatic ductal dilatation or surrounding inflammatory changes. Spleen: Normal in size without focal abnormality. Adrenals/Urinary Tract: Adrenal glands are within normal limits. Kidneys are well visualized bilaterally without renal calculi or obstructive changes. Few hypodensities are noted within the left kidney consistent with small cysts. The bladder is partially distended. Stomach/Bowel: The appendix is within normal limits. Scattered diverticular change of the colon is noted without evidence of diverticulitis. No small bowel abnormality is noted. Multiple ingested tablets are noted throughout the bowel. Vascular/Lymphatic: Aortic atherosclerosis. No enlarged abdominal or pelvic lymph nodes. Reproductive: Status post hysterectomy. No adnexal masses. Other: No abdominal wall hernia or abnormality. No abdominopelvic ascites. Musculoskeletal: Degenerative changes of lumbar spine are noted. Chronic L2 compression deformity is seen. IMPRESSION: Cholelithiasis without complicating factors. Fatty liver. No renal calculi or obstructive changes are noted. Diverticulosis without diverticulitis. Electronically Signed   By: Inez Catalina M.D.   On: 06/13/2018 13:07    Procedures Procedures (including critical care time)  Medications Ordered in ED Medications  iopamidol (ISOVUE-300) 61 % injection (has no administration in time range)  sodium chloride 0.9 % injection (has no administration in time range)  ketorolac (TORADOL) 30 MG/ML injection 15 mg (15 mg Intramuscular Given 06/13/18 0956)  iopamidol (ISOVUE-300) 61 % injection 100 mL (100 mLs Intravenous Contrast Given 06/13/18 1238)     Initial Impression / Assessment and Plan / ED Course  I have reviewed the triage vital signs and the nursing notes.  Pertinent labs & imaging results that were available during my care of the patient were reviewed by me and  considered in my medical decision making (see chart for details).     3:11 PM Patient in no distress, continues to feel better. A lengthy conversation with her and her companion about her CT findings, labs, urinalysis, suspicion for dehydration, and possible radicular pain given the absence of evidence for UTI, other acute abdominal processes per Patient also does have gallstones, though these seem unrelated to her episode given the absence of upper abdominal pain or GI complaints. Patient started on anti-inflammatories we will follow-up with primary care.  Final Clinical Impressions(s) /  ED Diagnoses   Final diagnoses:  Bilateral lower abdominal pain      Carmin Muskrat, MD 06/13/18 1512

## 2018-06-16 DIAGNOSIS — M545 Low back pain: Secondary | ICD-10-CM | POA: Diagnosis not present

## 2018-06-16 DIAGNOSIS — R52 Pain, unspecified: Secondary | ICD-10-CM | POA: Diagnosis not present

## 2018-06-20 DIAGNOSIS — I10 Essential (primary) hypertension: Secondary | ICD-10-CM | POA: Diagnosis not present

## 2018-06-20 DIAGNOSIS — D649 Anemia, unspecified: Secondary | ICD-10-CM | POA: Diagnosis not present

## 2018-06-20 DIAGNOSIS — Z79899 Other long term (current) drug therapy: Secondary | ICD-10-CM | POA: Diagnosis not present

## 2018-06-20 DIAGNOSIS — E782 Mixed hyperlipidemia: Secondary | ICD-10-CM | POA: Diagnosis not present

## 2018-06-20 DIAGNOSIS — M059 Rheumatoid arthritis with rheumatoid factor, unspecified: Secondary | ICD-10-CM | POA: Diagnosis not present

## 2018-06-23 DIAGNOSIS — Z0000 Encounter for general adult medical examination without abnormal findings: Secondary | ICD-10-CM | POA: Diagnosis not present

## 2018-06-23 DIAGNOSIS — Z23 Encounter for immunization: Secondary | ICD-10-CM | POA: Diagnosis not present

## 2018-06-27 DIAGNOSIS — F5101 Primary insomnia: Secondary | ICD-10-CM | POA: Diagnosis not present

## 2018-06-27 DIAGNOSIS — D649 Anemia, unspecified: Secondary | ICD-10-CM | POA: Diagnosis not present

## 2018-06-27 DIAGNOSIS — Z87440 Personal history of urinary (tract) infections: Secondary | ICD-10-CM | POA: Diagnosis not present

## 2018-06-27 DIAGNOSIS — N3941 Urge incontinence: Secondary | ICD-10-CM | POA: Diagnosis not present

## 2018-06-27 DIAGNOSIS — M1990 Unspecified osteoarthritis, unspecified site: Secondary | ICD-10-CM | POA: Diagnosis not present

## 2018-06-27 DIAGNOSIS — N3281 Overactive bladder: Secondary | ICD-10-CM | POA: Diagnosis not present

## 2018-06-27 DIAGNOSIS — Z0000 Encounter for general adult medical examination without abnormal findings: Secondary | ICD-10-CM | POA: Diagnosis not present

## 2018-06-27 DIAGNOSIS — M810 Age-related osteoporosis without current pathological fracture: Secondary | ICD-10-CM | POA: Diagnosis not present

## 2018-06-27 DIAGNOSIS — I10 Essential (primary) hypertension: Secondary | ICD-10-CM | POA: Diagnosis not present

## 2018-06-27 DIAGNOSIS — R1032 Left lower quadrant pain: Secondary | ICD-10-CM | POA: Diagnosis not present

## 2018-06-27 DIAGNOSIS — R300 Dysuria: Secondary | ICD-10-CM | POA: Diagnosis not present

## 2018-06-27 DIAGNOSIS — E782 Mixed hyperlipidemia: Secondary | ICD-10-CM | POA: Diagnosis not present

## 2018-06-27 DIAGNOSIS — M059 Rheumatoid arthritis with rheumatoid factor, unspecified: Secondary | ICD-10-CM | POA: Diagnosis not present

## 2018-07-04 DIAGNOSIS — M5416 Radiculopathy, lumbar region: Secondary | ICD-10-CM | POA: Diagnosis not present

## 2018-07-20 DIAGNOSIS — H04123 Dry eye syndrome of bilateral lacrimal glands: Secondary | ICD-10-CM | POA: Diagnosis not present

## 2018-07-20 DIAGNOSIS — H401122 Primary open-angle glaucoma, left eye, moderate stage: Secondary | ICD-10-CM | POA: Diagnosis not present

## 2018-07-20 DIAGNOSIS — H401113 Primary open-angle glaucoma, right eye, severe stage: Secondary | ICD-10-CM | POA: Diagnosis not present

## 2018-07-20 DIAGNOSIS — Z961 Presence of intraocular lens: Secondary | ICD-10-CM | POA: Diagnosis not present

## 2018-07-21 DIAGNOSIS — M25561 Pain in right knee: Secondary | ICD-10-CM | POA: Diagnosis not present

## 2018-07-21 DIAGNOSIS — M25361 Other instability, right knee: Secondary | ICD-10-CM | POA: Diagnosis not present

## 2018-08-02 ENCOUNTER — Other Ambulatory Visit: Payer: Self-pay

## 2018-08-02 ENCOUNTER — Encounter: Payer: Self-pay | Admitting: Family Medicine

## 2018-08-02 ENCOUNTER — Ambulatory Visit (INDEPENDENT_AMBULATORY_CARE_PROVIDER_SITE_OTHER): Payer: Medicare HMO | Admitting: Family Medicine

## 2018-08-02 VITALS — BP 108/72 | HR 72 | Temp 97.7°F | Ht 69.0 in | Wt 194.0 lb

## 2018-08-02 DIAGNOSIS — M059 Rheumatoid arthritis with rheumatoid factor, unspecified: Secondary | ICD-10-CM

## 2018-08-02 DIAGNOSIS — K760 Fatty (change of) liver, not elsewhere classified: Secondary | ICD-10-CM | POA: Diagnosis not present

## 2018-08-02 DIAGNOSIS — E782 Mixed hyperlipidemia: Secondary | ICD-10-CM

## 2018-08-02 DIAGNOSIS — N3281 Overactive bladder: Secondary | ICD-10-CM

## 2018-08-02 DIAGNOSIS — K5790 Diverticulosis of intestine, part unspecified, without perforation or abscess without bleeding: Secondary | ICD-10-CM | POA: Insufficient documentation

## 2018-08-02 DIAGNOSIS — M069 Rheumatoid arthritis, unspecified: Secondary | ICD-10-CM | POA: Insufficient documentation

## 2018-08-02 DIAGNOSIS — M150 Primary generalized (osteo)arthritis: Secondary | ICD-10-CM | POA: Diagnosis not present

## 2018-08-02 DIAGNOSIS — Z87442 Personal history of urinary calculi: Secondary | ICD-10-CM | POA: Diagnosis not present

## 2018-08-02 DIAGNOSIS — I10 Essential (primary) hypertension: Secondary | ICD-10-CM | POA: Insufficient documentation

## 2018-08-02 DIAGNOSIS — M810 Age-related osteoporosis without current pathological fracture: Secondary | ICD-10-CM

## 2018-08-02 DIAGNOSIS — M159 Polyosteoarthritis, unspecified: Secondary | ICD-10-CM | POA: Insufficient documentation

## 2018-08-02 NOTE — Patient Instructions (Addendum)
Please return in 6 months Recheck blood pressure and cholesterol. Come fasting.   Please ensure we have release of record signed for her pcp and specialists listed in care team. Thanks   It was a pleasure meeting you today! Thank you for choosing [place] to meet your healthcare needs! I truly look forward to working with you. If you have any questions or concerns, please send me a message via Mychart or call the office at [phone].

## 2018-08-02 NOTE — Progress Notes (Signed)
Subjective  CC:  Chief Complaint  Patient presents with  . Establish Care    Transfer Care from [facility], last physical 2 weeks ago   . Pain    she states that she has Tingling in her feet.     HPI: Brittany Stark is a 72 y.o. female who presents to [facility] today to establish care with me as a new patient.   She has the following concerns or needs:  72 year old female who recently had complete physical at [facility] with reported normal blood work.  No primary care records available for review today.  I did review the chart.  Has been dealing with postoperative knee pain after right knee replacement for the last year or 2.  Has low back pain that is likely related to DJD.  Has history of hypertension hyperlipidemia and osteoporosis, she stopped all her medication 2 weeks ago.  She does not like to take medication unless absolutely needed.  She says she is eating better and does not feel her blood pressure cholesterol any longer having problems.  She feels that the biphosphonate's were giving her muscle aches.  I do not have her most recent bone density scan.  Recent CT scans did show stable L2 compression fracture.  She complains of left-sided low back pain.  This may be due to her DJD.  Evaluation by her PCP and ER did not show any other cause.  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  She has 2 grown children.  She is independent.  She denies problems with mood.  She does feel that her balance is unstable.  She denies recent falls.  She may be due for an annual wellness visit.  Her problem list was updated.  Health maintenance: She believes she has had the pneumo coccal vaccinations.  This will need to be verified.  She reports she has had a DEXA scan.  Need to get records from her rheumatologist.  Colorectal cancer screening and mammograms are  up-to-date.  Assessment  1. Essential hypertension   2. Mixed hyperlipidemia   3. History of kidney stones   4. Rheumatoid arthritis with positive rheumatoid factor, involving unspecified site ([place])   5. Primary osteoarthritis involving multiple joints   6. Fatty liver   7. Diverticulosis   8. Age-related osteoporosis without current pathological fracture   9. Overactive bladder      Plan   Reviewed his medical problems in detail.  Patient seems to be doing well and feeling well.  Has stopped multiple medications including narcotic pain medicines, blood pressure medicines and lipid medications.  We will recheck blood pressure and lipids in 6 months.  Recommend healthy diet and keeping active.  She does have osteoarthritic pain for which Tylenol will be used.  Rheumatoid arthritis: She would like to change rheumatologist.  She feels it would be awkward to go back to [facility].  A referral was placed.  We will get old records to review immunizations, bone density scan and make recommendations regarding medications for osteoporosis.  Follow-up 6 months  We will plan for annual wellness visit if needed.  CPE was 2 weeks ago  Follow up:  Return in about 6 months (around 01/31/2019) for follow up Hypertension, follow up hypercholesterolemia. Orders Placed This Encounter  Procedures  . Ambulatory referral to Rheumatology   No orders of the defined types were placed  in this encounter.    Depression screen PHQ 2/9 08/02/2018  Decreased Interest 0  Down, Depressed, Hopeless 0  PHQ - 2 Score 0     We updated and reviewed the patient's past history in detail and it is documented below.  Patient Active Problem List   Diagnosis Date Noted  . Essential hypertension 08/02/2018    Priority: High    Stopped low-dose amlodipine November 2019   . Mixed hyperlipidemia 08/02/2018    Priority: High    Stopped statin November 2019.   Marland Kitchen Rheumatoid arthritis ([place])  08/02/2018    Priority: High  . Osteoarthritis, multiple sites 08/02/2018    Priority: High  . Osteoporosis 08/02/2018    Priority: High    Patient stopped biphosphonate November 2019, after 2 years of use.   . Fatty liver 08/02/2018    Priority: Medium  . History of kidney stones     Priority: Medium    hx of   . History of total knee replacement, right 12/01/2017    Priority: Medium  . Osteoarthritis of left knee 11/02/2016    Priority: Medium  . Hydronephrosis of right kidney 05/01/2014    Priority: Medium  . Diverticulosis 08/02/2018    Priority: Low  . Overactive bladder 08/02/2018   Health Maintenance  Topic Date Due  . DEXA SCAN  12-19-1945  . Hepatitis C Screening  Oct 06, 1945  . TETANUS/TDAP  11/24/1965  . PNA vac Low Risk Adult (1 of 2 - PCV13) 11/25/2011  . MAMMOGRAM  11/11/2018  . COLONOSCOPY  10/30/2020  . INFLUENZA VACCINE  Completed   Immunization History  Administered Date(s) Administered  . Influenza,inj,quad, With Preservative 07/15/2017  . Zoster Recombinat (Shingrix) 02/24/2018, 05/28/2018   Current Meds  Medication Sig  . BIOTIN PO Take 1 capsule by mouth daily.  . COMBIGAN 0.2-0.5 % ophthalmic solution INT 1 GTT IN OU BID  . mirabegron ER (MYRBETRIQ) 50 MG TB24 tablet Myrbetriq 50 mg tablet,extended release  Take 1 tablet every day by oral route.  Marland Kitchen omeprazole (PRILOSEC) 20 MG capsule TAKE 1 CAPSULE(20 MG) BY MOUTH DAILY  . XELJANZ XR 11 MG TB24 Take 1 tablet by mouth daily.     Allergies: Patient is allergic to amoxicillin; arava [leflunomide]; doxycycline; and methotrexate derivatives. Past Medical History Patient  has a past medical history of Arthritis, Depression, Elevated cholesterol, GERD (gastroesophageal reflux disease), Hemorrhoids, History of kidney stones, Hypertension, and Patellar tendon rupture, right, subsequent encounter (12/23/2016). Past Surgical History Patient  has a past surgical history that includes Abdominal  hysterectomy; Breast surgery; right ankle surgery ; right arm surgery ; Cystoscopy w/ retrogrades (Right, 05/01/2014); Cystoscopy with biopsy (05/01/2014); Cataract extraction; Knee arthroscopy (1995); Colonoscopy; Total knee arthroplasty (Right, 11/02/2016); ORIF patella (Right, 12/23/2016); and Total knee arthroplasty (Left, 04/11/2018). Family History: Patient family history includes Breast cancer in her maternal aunt and mother; Heart disease in her sister. Social History:  Patient  reports that she has never smoked. She has never used smokeless tobacco. She reports that she does not drink alcohol or use drugs.  Review of Systems: Constitutional: negative for fever or malaise Ophthalmic: negative for photophobia, double vision or loss of vision Cardiovascular: negative for chest pain, dyspnea on exertion, or new LE swelling Respiratory: negative for SOB or persistent cough Gastrointestinal: negative for abdominal pain, change in bowel habits or melena Genitourinary: negative for dysuria or gross hematuria Musculoskeletal: negative for new gait disturbance or muscular weakness, has chronic right knee pain and low back pain.  Integumentary: negative for new or persistent rashes Neurological: negative for TIA or stroke symptoms, reports poor balance Psychiatric: negative for SI or delusions Allergic/Immunologic: negative for hives  Patient Care Team    Relationship Specialty Notifications Start End  Valinda Party, MD PCP - General Rheumatology  08/02/18   Gaynelle Arabian, MD Consulting Physician Orthopedic Surgery  08/02/18   Irene Shipper, MD Consulting Physician Gastroenterology  08/02/18   Irine Seal, MD Attending Physician Urology  08/02/18     Objective  Vitals: BP 108/72   Pulse 72   Temp 97.7 F (36.5 C)   Ht 5\' 9"  (1.753 m)   Wt 194 lb (88 kg)   SpO2 99%   BMI 28.65 kg/m  General:  Well developed, well nourished, no acute distress  Psych:  Alert and oriented,normal mood and  affect HEENT:  Normocephalic, atraumatic, non-icteric sclera, PERRL, oropharynx is without mass or exudate, supple neck without adenopathy, mass or thyromegaly Cardiovascular:  RRR without gallop, rub or murmur, nondisplaced PMI Respiratory:  Good breath sounds bilaterally, CTAB with normal respiratory effort Gastrointestinal: normal bowel sounds, soft, non-tender, no noted masses. No HSM MSK: Bilateral hands with ulnar deviation consistent with rheumatoid arthritic changes, mild scoliosis Skin:  Warm, no rashes or suspicious lesions noted Neurologic:    Mental status is normal. Gross motor and sensory exams are normal.  Walks with a cane   Commons side effects, risks, benefits, and alternatives for medications and treatment plan prescribed today were discussed, and the patient expressed understanding of the given instructions. Patient is instructed to call or message via MyChart if he/she has any questions or concerns regarding our treatment plan. No barriers to understanding were identified. We discussed Red Flag symptoms and signs in detail. Patient expressed understanding regarding what to do in case of urgent or emergency type symptoms.   Medication list was reconciled, printed and provided to the patient in AVS. Patient instructions and summary information was reviewed with the patient as documented in the AVS. This note was prepared with assistance of Dragon voice recognition software. Occasional wrong-word or sound-a-like substitutions may have occurred due to the inherent limitations of voice recognition software

## 2018-08-04 DIAGNOSIS — M25561 Pain in right knee: Secondary | ICD-10-CM | POA: Diagnosis not present

## 2018-08-09 ENCOUNTER — Encounter: Payer: Self-pay | Admitting: Emergency Medicine

## 2018-08-15 DIAGNOSIS — N3941 Urge incontinence: Secondary | ICD-10-CM | POA: Diagnosis not present

## 2018-08-15 DIAGNOSIS — Z87440 Personal history of urinary (tract) infections: Secondary | ICD-10-CM | POA: Diagnosis not present

## 2018-08-15 DIAGNOSIS — R8271 Bacteriuria: Secondary | ICD-10-CM | POA: Diagnosis not present

## 2018-08-23 DIAGNOSIS — M5136 Other intervertebral disc degeneration, lumbar region: Secondary | ICD-10-CM | POA: Diagnosis not present

## 2018-09-01 ENCOUNTER — Ambulatory Visit: Payer: Self-pay

## 2018-09-01 NOTE — Telephone Encounter (Signed)
If new or worsening and NOT related to knee problems, then recommend office visit to check it ou.

## 2018-09-01 NOTE — Telephone Encounter (Signed)
Nurse triage attempted to contact pt 3 times for her symptoms. Please advise See reason pt called in note below.

## 2018-09-01 NOTE — Telephone Encounter (Signed)
Call placed to patient. Left VM to return call to office.  Message from Ivar Drape sent at 09/01/2018 9:28 AM EST   Patient stated her feet, legs and ankles have been swelling for 3 to 4 months now, but lately it has gotten worse. She would like to talk to a nurse about it. She doesn't know if it is something she should come into the office for.

## 2018-09-01 NOTE — Telephone Encounter (Signed)
FYI

## 2018-09-01 NOTE — Telephone Encounter (Signed)
Left VM to call office.

## 2018-09-01 NOTE — Telephone Encounter (Signed)
Call placed to patient. Left VM to call office

## 2018-09-02 NOTE — Telephone Encounter (Signed)
LMOVM for pt to return call 

## 2018-09-06 DIAGNOSIS — M5416 Radiculopathy, lumbar region: Secondary | ICD-10-CM | POA: Diagnosis not present

## 2018-09-06 NOTE — Telephone Encounter (Signed)
LMOVM for pt to return call 

## 2018-09-16 NOTE — Telephone Encounter (Signed)
Several call to pt with no return.

## 2018-10-07 DIAGNOSIS — Z6829 Body mass index (BMI) 29.0-29.9, adult: Secondary | ICD-10-CM | POA: Diagnosis not present

## 2018-10-07 DIAGNOSIS — R5383 Other fatigue: Secondary | ICD-10-CM | POA: Diagnosis not present

## 2018-10-07 DIAGNOSIS — M150 Primary generalized (osteo)arthritis: Secondary | ICD-10-CM | POA: Diagnosis not present

## 2018-10-07 DIAGNOSIS — E663 Overweight: Secondary | ICD-10-CM | POA: Diagnosis not present

## 2018-10-07 DIAGNOSIS — M2550 Pain in unspecified joint: Secondary | ICD-10-CM | POA: Diagnosis not present

## 2018-10-07 DIAGNOSIS — M0579 Rheumatoid arthritis with rheumatoid factor of multiple sites without organ or systems involvement: Secondary | ICD-10-CM | POA: Diagnosis not present

## 2018-10-18 ENCOUNTER — Telehealth: Payer: Self-pay | Admitting: *Deleted

## 2018-10-18 NOTE — Telephone Encounter (Signed)
Chart reviewed.  She was seen once here in November.  At that time she was doing well.  I do not see medical reasons for jury duty exclusion.  If she feels it is due to knee pain she may discuss with her orthopedic doctor.  Thanks.

## 2018-10-18 NOTE — Telephone Encounter (Signed)
Pt is aware.  

## 2018-10-18 NOTE — Telephone Encounter (Signed)
Please advise if this can be done.  Copied from [place] [id number]. Topic: General - Other >> Oct 18, 2018  1:29 PM [name] wrote: Reason for CRM: Patient is calling to state she is needing a note for jury duty to state she is unable to do jury duty due to  her medical condition. Patient stated the letter can be  type and she come pick it up at the front. Her jury date is feb 13th. Please advise

## 2018-11-30 DIAGNOSIS — M150 Primary generalized (osteo)arthritis: Secondary | ICD-10-CM | POA: Diagnosis not present

## 2018-11-30 DIAGNOSIS — E669 Obesity, unspecified: Secondary | ICD-10-CM | POA: Diagnosis not present

## 2018-11-30 DIAGNOSIS — M0579 Rheumatoid arthritis with rheumatoid factor of multiple sites without organ or systems involvement: Secondary | ICD-10-CM | POA: Diagnosis not present

## 2018-11-30 DIAGNOSIS — Z6830 Body mass index (BMI) 30.0-30.9, adult: Secondary | ICD-10-CM | POA: Diagnosis not present

## 2018-11-30 DIAGNOSIS — M2550 Pain in unspecified joint: Secondary | ICD-10-CM | POA: Diagnosis not present

## 2018-12-22 ENCOUNTER — Ambulatory Visit: Payer: Self-pay

## 2018-12-22 ENCOUNTER — Ambulatory Visit: Payer: Self-pay | Admitting: Family Medicine

## 2018-12-22 NOTE — Telephone Encounter (Signed)
Spoke with patient regarding symptoms.  Patient plans to follow instructions provided by [facility] triage nurse. If symptoms/pain persists or increase, she will go to Urgent Care. Offered virtual visit with Dr. Jonni Sanger, patient declined.

## 2018-12-22 NOTE — Telephone Encounter (Signed)
As call was being transferred from agent call was dropped.  Attempted to call pt back for her symptoms of swelling of her lower extremities. Left VM to return call to office.

## 2018-12-22 NOTE — Telephone Encounter (Signed)
Pt. Reports she has noticed swelling to both legs x 2-3 days. Swelling gets better at night when legs are elevated. Last night had pain in right calf, "like a charlie horse." Pain ansd swelling is a little better this morning. No shortness of breath or chest pain. No redness. Would like advice from Dr. Jonni Sanger. Spoke with Levada Dy in the practice. Will forward triage over for review. Pt. Will try Tylenol and ice and elevate legs. Instructed if pain increases to go to UC. Verbalizes understanding. Answer Assessment - Initial Assessment Questions 1. ONSET: "When did the pain start?"      Started 2 days ago the swelling - the pain started last night 2. LOCATION: "Where is the pain located?"      Right calf 3. PAIN: "How bad is the pain?"    (Scale 1-10; or mild, moderate, severe)   -  MILD (1-3): doesn't interfere with normal activities    -  MODERATE (4-7): interferes with normal activities (e.g., work or school) or awakens from sleep, limping    -  SEVERE (8-10): excruciating pain, unable to do any normal activities, unable to walk      9 4. WORK OR EXERCISE: "Has there been any recent work or exercise that involved this part of the body?"      No 5. CAUSE: "What do you think is causing the leg pain?"     Unsure 6. OTHER SYMPTOMS: "Do you have any other symptoms?" (e.g., chest pain, back pain, breathing difficulty, swelling, rash, fever, numbness, weakness)     Swelling to calf, goes down at night, no chest pain or shortness of breath 7. PREGNANCY: "Is there any chance you are pregnant?" "When was your last menstrual period?"     No  Protocols used: LEG PAIN-A-AH

## 2019-01-05 ENCOUNTER — Encounter: Payer: Self-pay | Admitting: Family Medicine

## 2019-01-05 ENCOUNTER — Other Ambulatory Visit: Payer: Self-pay

## 2019-01-05 ENCOUNTER — Other Ambulatory Visit (HOSPITAL_COMMUNITY): Payer: Self-pay | Admitting: Orthopedic Surgery

## 2019-01-05 ENCOUNTER — Telehealth: Payer: Self-pay | Admitting: Family Medicine

## 2019-01-05 ENCOUNTER — Ambulatory Visit (INDEPENDENT_AMBULATORY_CARE_PROVIDER_SITE_OTHER): Payer: Medicare Other | Admitting: Family Medicine

## 2019-01-05 ENCOUNTER — Ambulatory Visit (HOSPITAL_COMMUNITY)
Admission: RE | Admit: 2019-01-05 | Discharge: 2019-01-05 | Disposition: A | Discharge status: Home / Self Care | Payer: Medicare Other | Source: Ambulatory Visit | Attending: Cardiology | Admitting: Cardiology

## 2019-01-05 DIAGNOSIS — M79662 Pain in left lower leg: Secondary | ICD-10-CM | POA: Diagnosis not present

## 2019-01-05 DIAGNOSIS — M79661 Pain in right lower leg: Secondary | ICD-10-CM | POA: Insufficient documentation

## 2019-01-05 DIAGNOSIS — I10 Essential (primary) hypertension: Secondary | ICD-10-CM

## 2019-01-05 DIAGNOSIS — M7121 Synovial cyst of popliteal space [Baker], right knee: Secondary | ICD-10-CM

## 2019-01-05 DIAGNOSIS — M79605 Pain in left leg: Secondary | ICD-10-CM | POA: Diagnosis not present

## 2019-01-05 DIAGNOSIS — M79604 Pain in right leg: Secondary | ICD-10-CM | POA: Diagnosis not present

## 2019-01-05 DIAGNOSIS — Z96651 Presence of right artificial knee joint: Secondary | ICD-10-CM | POA: Diagnosis not present

## 2019-01-05 MED ORDER — HYDROCHLOROTHIAZIDE 25 MG PO TABS: 12.5000 mg | ORAL_TABLET | Freq: Every day | ORAL | 0 refills | Status: DC | PRN

## 2019-01-05 NOTE — Progress Notes (Signed)
TELEPHONE ENCOUNTER   Patient verbally agreed to telephone visit and is aware that copayment and coinsurance may apply. Patient was treated using telemedicine according to accepted telemedicine protocols.  Location of the patient: home Location of provider: [place], [place] office Names of all persons participating in the telemedicine service and role in the encounter: Leamon Arnt, MD Lilli Light, CMA   Subjective  CC:  Chief Complaint  Patient presents with  . Leg Pain    bilateral sides, right worse. She reports that she saw Dr. Maureen Ralphs and was told she had a Baker's Cyst and to call PCP.     HPI: Brittany Stark is a 73 y.o. female who was telephoned today to address the problems listed above in the chief complaint.  Reviewed recent ortho note from earlier today. Ultrasound report not available. Per pt, has noted mild increased posterior knee pain over the last month, but over last week had increased swelling and pain behind the knee; pain with walking, bending ans sore to touch. Right Doppler today reportedly showed Baker's cyst with LE edema. Mild pain. Sleeping ok.   LEG edema: pt reports both legs are swelling but right is worse. Resolves with elevation and denies skin changes.   HTN: had been on low dose amlodipine but stopped on her own last year. She reports home bp readings run 140s-150/80s. No c/o cp, palpitations, sob, DOE.   I reviewed last labs:  Lab Results  Component Value Date   CREATININE 1.03 (H) 06/13/2018   BUN 22 06/13/2018   NA 147 (H) 06/13/2018   K 3.3 (L) 06/13/2018   CL 107 06/13/2018   CO2 28 06/13/2018   Lab Results  Component Value Date   WBC 5.0 06/13/2018   HGB 10.6 (L) 06/13/2018   HCT 33.1 (L) 06/13/2018   MCV 97.6 06/13/2018   PLT 258 06/13/2018     BP:  BP Readings from Last 3 Encounters:  08/02/18 108/72  06/13/18 134/74  04/12/18 (!) 149/73    ASSESSMENT: 1. Baker cyst, right   2. Essential  hypertension   3. History of total knee replacement, right      Baker cyst: right:  Supportive care. Neg DVT. Elevate, tylenol as needed for pain.   LE edema: difficult since telephone visit. Recommend prn hctz 25 and OV in one week to check legs, weight, BP and lab work. Then will adjust meds accordingly. Pt cannot come sooner than May 1st.   Chronic knee pain is stable. S/p TKR.  Time spent with the patient (non face-to-face time during this virtual encounter): 25 minutes, spent in obtaining information about her symptoms, reviewing her previous labs, evaluations, and treatments, counseling her about her condition (please see the discussed topics above), and developing a plan to further investigate it; the patient was provided an opportunity to ask questions and all were answered. The patient agreed with the plan and demonstrated an understanding of the instructions.   The patient was advised to call back or seek an in-person evaluation if the symptoms worsen or if the condition fails to improve as anticipated.  Follow up: No follow-ups on file.  01/31/2019  No orders of the defined types were placed in this encounter.  Meds ordered this encounter  Medications  . hydrochlorothiazide (HYDRODIURIL) 25 MG tablet    Sig: Take 0.5-1 tablets (12.5-25 mg total) by mouth daily as needed (swelling in legs).    Dispense:  30 tablet    Refill:  0     I reviewed the patients updated PMH, FH, and SocHx.    Patient Active Problem List   Diagnosis Date Noted  . Essential hypertension 08/02/2018    Priority: High  . Mixed hyperlipidemia 08/02/2018    Priority: High  . Rheumatoid arthritis ([place]) 08/02/2018    Priority: High  . Osteoarthritis, multiple sites 08/02/2018    Priority: High  . Osteoporosis 08/02/2018    Priority: High  . Fatty liver 08/02/2018    Priority: Medium  . History of kidney stones     Priority: Medium  . History of total knee replacement, right 12/01/2017     Priority: Medium  . Osteoarthritis of left knee 11/02/2016    Priority: Medium  . Hydronephrosis of right kidney 05/01/2014    Priority: Medium  . Diverticulosis 08/02/2018    Priority: Low  . Degeneration of lumbar intervertebral disc 08/23/2018  . Overactive bladder 08/02/2018   Current Meds  Medication Sig  . BIOTIN PO Take 1 capsule by mouth daily.  . COMBIGAN 0.2-0.5 % ophthalmic solution INT 1 GTT IN OU BID  . omeprazole (PRILOSEC) 20 MG capsule TAKE 1 CAPSULE(20 MG) BY MOUTH DAILY  . Pumpkin Seed-Soy Germ (AZO BLADDER CONTROL/GO-LESS PO) Take by mouth.  Marland Kitchen Upadacitinib (RINVOQ PO) Rinvoq    Allergies: Patient is allergic to amoxicillin; arava [leflunomide]; doxycycline; and methotrexate derivatives. Family History: Patient family history includes Breast cancer in her maternal aunt and mother; Heart disease in her sister. Social History:  Patient  reports that she has never smoked. She has never used smokeless tobacco. She reports that she does not drink alcohol or use drugs.  Review of Systems: Constitutional: Negative for fever malaise or anorexia Cardiovascular: negative for chest pain Respiratory: negative for SOB or persistent cough Gastrointestinal: negative for abdominal pain

## 2019-01-05 NOTE — Telephone Encounter (Signed)
Reviewed chart.  Saw ortho today for leg pain and swelling.  Doppler ordered.  She needs a virtual visit (vidoe or telephone) so I can assess her leg swelling and discuss medication. Thanks

## 2019-01-05 NOTE — Progress Notes (Signed)
I have discussed the procedure for the virtual visit with the patient who has given consent to proceed with assessment and treatment.   Jelan Batterton S Lorena Benham, CMA     

## 2019-01-05 NOTE — Telephone Encounter (Signed)
Copied from [place] [id number]. Topic: Quick Communication - See Telephone Encounter >> Jan 05, 2019  9:30 AM Robina Ade, Helene Kelp D wrote: CRM for notification. See Telephone encounter for: 01/05/19. Patient called and said that she had a knee surgery and they told her that she needed to request a fluid pill Rx from her provider. If any questions please call patient back, thanks.

## 2019-01-05 NOTE — Telephone Encounter (Signed)
See note

## 2019-01-05 NOTE — Telephone Encounter (Signed)
Please advise, looks like she may have saw or talked to Cardiology today

## 2019-01-12 ENCOUNTER — Encounter: Payer: Self-pay | Admitting: Family Medicine

## 2019-01-12 ENCOUNTER — Other Ambulatory Visit: Payer: Self-pay

## 2019-01-12 ENCOUNTER — Ambulatory Visit (INDEPENDENT_AMBULATORY_CARE_PROVIDER_SITE_OTHER): Payer: Medicare Other | Admitting: Family Medicine

## 2019-01-12 VITALS — BP 142/88 | HR 77 | Temp 97.7°F | Resp 16 | Ht 69.0 in | Wt 190.4 lb

## 2019-01-12 DIAGNOSIS — M7121 Synovial cyst of popliteal space [Baker], right knee: Secondary | ICD-10-CM | POA: Diagnosis not present

## 2019-01-12 DIAGNOSIS — R600 Localized edema: Secondary | ICD-10-CM | POA: Diagnosis not present

## 2019-01-12 DIAGNOSIS — I10 Essential (primary) hypertension: Secondary | ICD-10-CM | POA: Diagnosis not present

## 2019-01-12 DIAGNOSIS — R011 Cardiac murmur, unspecified: Secondary | ICD-10-CM | POA: Diagnosis not present

## 2019-01-12 DIAGNOSIS — M150 Primary generalized (osteo)arthritis: Secondary | ICD-10-CM | POA: Diagnosis not present

## 2019-01-12 DIAGNOSIS — E782 Mixed hyperlipidemia: Secondary | ICD-10-CM

## 2019-01-12 DIAGNOSIS — M159 Polyosteoarthritis, unspecified: Secondary | ICD-10-CM

## 2019-01-12 DIAGNOSIS — M059 Rheumatoid arthritis with rheumatoid factor, unspecified: Secondary | ICD-10-CM

## 2019-01-12 LAB — LIPID PANEL
Cholesterol: 238 mg/dL — ABNORMAL HIGH (ref 0–200)
HDL: 63.7 mg/dL (ref >39.00)
LDL Cholesterol: 144 mg/dL — ABNORMAL HIGH (ref 0–99)
NonHDL: 174.6
Total CHOL/HDL Ratio: 4
Triglycerides: 153 mg/dL — ABNORMAL HIGH (ref 0.0–149.0)
VLDL: 30.6 mg/dL (ref 0.0–40.0)

## 2019-01-12 LAB — CBC WITH DIFFERENTIAL/PLATELET
Basophils Absolute: 0 10*3/uL (ref 0.0–0.1)
Basophils Relative: 0.8 % (ref 0.0–3.0)
Eosinophils Absolute: 0.1 10*3/uL (ref 0.0–0.7)
Eosinophils Relative: 1.6 % (ref 0.0–5.0)
HCT: 32.1 % — ABNORMAL LOW (ref 36.0–46.0)
Hemoglobin: 10.5 g/dL — ABNORMAL LOW (ref 12.0–15.0)
Lymphocytes Relative: 46.3 % — ABNORMAL HIGH (ref 12.0–46.0)
Lymphs Abs: 2.1 10*3/uL (ref 0.7–4.0)
MCHC: 32.5 g/dL (ref 30.0–36.0)
MCV: 92.4 fl (ref 78.0–100.0)
Monocytes Absolute: 0.4 10*3/uL (ref 0.1–1.0)
Monocytes Relative: 8.5 % (ref 3.0–12.0)
Neutro Abs: 1.9 10*3/uL (ref 1.4–7.7)
Neutrophils Relative %: 42.8 % — ABNORMAL LOW (ref 43.0–77.0)
Platelets: 277 10*3/uL (ref 150.0–400.0)
RBC: 3.48 Mil/uL — ABNORMAL LOW (ref 3.87–5.11)
RDW: 18.3 % — ABNORMAL HIGH (ref 11.5–15.5)
WBC: 4.5 10*3/uL (ref 4.0–10.5)

## 2019-01-12 LAB — COMPREHENSIVE METABOLIC PANEL
ALT: 11 U/L (ref 0–35)
AST: 20 U/L (ref 0–37)
Albumin: 3.8 g/dL (ref 3.5–5.2)
Alkaline Phosphatase: 88 U/L (ref 39–117)
BUN: 16 mg/dL (ref 6–23)
CO2: 28 mEq/L (ref 19–32)
Calcium: 9 mg/dL (ref 8.4–10.5)
Chloride: 105 mEq/L (ref 96–112)
Creatinine, Ser: 0.84 mg/dL (ref 0.40–1.20)
GFR: 80.61 mL/min (ref >60.00)
Glucose, Bld: 128 mg/dL — ABNORMAL HIGH (ref 70–99)
Potassium: 3.7 mEq/L (ref 3.5–5.1)
Sodium: 141 mEq/L (ref 135–145)
Total Bilirubin: 0.4 mg/dL (ref 0.2–1.2)
Total Protein: 7.9 g/dL (ref 6.0–8.3)

## 2019-01-12 LAB — TSH: TSH: 1.89 u[IU]/mL (ref 0.35–4.50)

## 2019-01-12 LAB — BRAIN NATRIURETIC PEPTIDE: Pro B Natriuretic peptide (BNP): 26 pg/mL (ref 0.0–100.0)

## 2019-01-12 MED ORDER — HYDROCHLOROTHIAZIDE 25 MG PO TABS: 25.0000 mg | ORAL_TABLET | Freq: Every day | ORAL | 1 refills | Status: DC | PRN

## 2019-01-12 NOTE — Patient Instructions (Addendum)
Please return in 3 months for follow up of your hypertension and leg swelling. We will also consider getting an ultrasound of your heart to evaluate your murmur at that time as well.   Please take one tablet (HCTZ) daily for your leg swelling and your high blood pressure.   If you have any questions or concerns, please don't hesitate to send me a message via MyChart or call the office at [phone]. Thank you for visiting with [place] today! It's our pleasure caring for you.

## 2019-01-12 NOTE — Assessment & Plan Note (Signed)
Recheck non fasting levels. Likely is a candidate for statin but currently refusing: believes it causes cancer. Will get results and educate and then recommend meds if indicated

## 2019-01-12 NOTE — Progress Notes (Signed)
Subjective  CC:  Chief Complaint  Patient presents with  . Hypertension  . Leg Swelling    Right side, she reports swelling has improved    HPI: Brittany Stark is a 73 y.o. female who presents to the office today to address the problems listed above in the chief complaint.  Hypertension f/u: pt had stopped meds in November. We started 12.5 hctz a few weeks ago for leg edema and bp: she reports it is helping. Swelling responds to meds and elevation. She denies orthopnea, cp, doe, or h/o chf. dxd with right baker cyst last week and this is stable. Swelling is worse on right. Has h/o of Bilateral knee replacments and RA that is now doing better on meds per dr. Trudie Reed. She denies adverse effects from his BP medications. Compliance with medication is good. She has a h/o OAB managed by dr. Jeffie Pollock and unresponsive to meds. Uses azo prn.   Leg edema: as above. Improving.   RA: feeling much better.   H/o HLD: refuses statin.  Assessment  1. Bilateral lower extremity edema   2. Essential hypertension   3. Primary osteoarthritis involving multiple joints   4. Baker cyst, right   5. Heart murmur, systolic   6. Mixed hyperlipidemia   7. Rheumatoid arthritis with positive rheumatoid factor, involving unspecified site [facility])      Plan    See below for problem based assessment and plan documentation  Education regarding management of these chronic disease states was given. Management strategies discussed on successive visits include dietary and exercise recommendations, goals of achieving and maintaining IBW, and lifestyle modifications aiming for adequate sleep and minimizing stressors.   Follow up: Return in about 3 months (around 04/13/2019) for follow up Hypertension and leg swelling.  Orders Placed This Encounter  Procedures  . CBC with Differential/Platelet  . Comprehensive metabolic panel  . Lipid panel  . TSH  . B Nat Peptide   Meds ordered this encounter  Medications  .  hydrochlorothiazide (HYDRODIURIL) 25 MG tablet    Sig: Take 1 tablet (25 mg total) by mouth daily as needed (swelling in legs).    Dispense:  90 tablet    Refill:  1      BP Readings from Last 3 Encounters:  01/12/19 (!) 142/88  08/02/18 108/72  06/13/18 134/74   Wt Readings from Last 3 Encounters:  01/12/19 190 lb 6.4 oz (86.4 kg)  08/02/18 194 lb (88 kg)  04/11/18 190 lb (86.2 kg)    No results found for: CHOL No results found for: HDL No results found for: LDLCALC No results found for: TRIG No results found for: CHOLHDL No results found for: LDLDIRECT Lab Results  Component Value Date   CREATININE 1.03 (H) 06/13/2018   BUN 22 06/13/2018   NA 147 (H) 06/13/2018   K 3.3 (L) 06/13/2018   CL 107 06/13/2018   CO2 28 06/13/2018    The ASCVD Risk score (Goff DC Jr., et al., 2013) failed to calculate for the following reasons:   Cannot find a previous HDL lab   Cannot find a previous total cholesterol lab  I reviewed the patients updated PMH, FH, and SocHx.    Patient Active Problem List   Diagnosis Date Noted  . Essential hypertension 08/02/2018    Priority: High  . Mixed hyperlipidemia 08/02/2018    Priority: High  . Rheumatoid arthritis ([place]) 08/02/2018    Priority: High  . Osteoarthritis, multiple sites 08/02/2018  Priority: High  . Osteoporosis 08/02/2018    Priority: High  . Bilateral lower extremity edema 01/12/2019    Priority: Medium  . Fatty liver 08/02/2018    Priority: Medium  . History of kidney stones     Priority: Medium  . History of total knee replacement, right 12/01/2017    Priority: Medium  . Osteoarthritis of left knee 11/02/2016    Priority: Medium  . Hydronephrosis of right kidney 05/01/2014    Priority: Medium  . Diverticulosis 08/02/2018    Priority: Low  . Degeneration of lumbar intervertebral disc 08/23/2018  . Overactive bladder 08/02/2018    Allergies: Amoxicillin; Arava [leflunomide]; Doxycycline; and Methotrexate  derivatives  Social History: Patient  reports that she has never smoked. She has never used smokeless tobacco. She reports that she does not drink alcohol or use drugs.  Current Meds  Medication Sig  . acetaminophen (TYLENOL) 500 MG tablet Take 500 mg by mouth every 6 (six) hours as needed. 2 in am 2 at night  . BIOTIN PO Take 1 capsule by mouth daily.  . COMBIGAN 0.2-0.5 % ophthalmic solution INT 1 GTT IN OU BID  . hydrochlorothiazide (HYDRODIURIL) 25 MG tablet Take 1 tablet (25 mg total) by mouth daily as needed (swelling in legs).  Marland Kitchen omeprazole (PRILOSEC) 20 MG capsule TAKE 1 CAPSULE(20 MG) BY MOUTH DAILY  . Pumpkin Seed-Soy Germ (AZO BLADDER CONTROL/GO-LESS PO) Take by mouth.  Marland Kitchen Upadacitinib (RINVOQ PO) Rinvoq  . [DISCONTINUED] hydrochlorothiazide (HYDRODIURIL) 25 MG tablet Take 0.5-1 tablets (12.5-25 mg total) by mouth daily as needed (swelling in legs).    Review of Systems: Cardiovascular: negative for chest pain, palpitations, leg swelling, orthopnea Respiratory: negative for SOB, wheezing or persistent cough Gastrointestinal: negative for abdominal pain Genitourinary: negative for dysuria or gross hematuria  Objective  Vitals: BP (!) 142/88   Pulse 77   Temp 97.7 F (36.5 C) (Oral)   Resp 16   Ht 5\' 9"  (1.753 m)   Wt 190 lb 6.4 oz (86.4 kg)   SpO2 99%   BMI 28.12 kg/m  General: no acute distress  Psych:  Alert and oriented, normal mood and affect HEENT:  Normocephalic, atraumatic, supple neck  Cardiovascular:  RRR with 2-6/3 systolic murmur w/o click or rub or gallop Respiratory:  Good breath sounds bilaterally, CTAB with normal respiratory effort, no rales Skin:  Warm, no rashes Right knee with significant RA/OA changes; post swelling Bilateral +2 pitting edema to mid calves Neurologic:   Mental status is normal  Commons side effects, risks, benefits, and alternatives for medications and treatment plan prescribed today were discussed, and the patient expressed  understanding of the given instructions. Patient is instructed to call or message via MyChart if he/she has any questions or concerns regarding our treatment plan. No barriers to understanding were identified. We discussed Red Flag symptoms and signs in detail. Patient expressed understanding regarding what to do in case of urgent or emergency type symptoms.   Medication list was reconciled, printed and provided to the patient in AVS. Patient instructions and summary information was reviewed with the patient as documented in the AVS. This note was prepared with assistance of Dragon voice recognition software. Occasional wrong-word or sound-a-like substitutions may have occurred due to the inherent limitations of voice recognition software

## 2019-01-12 NOTE — Assessment & Plan Note (Signed)
Uncontrolled now: increase hctz to 25 mg daily and recheck 3 months. Check renal and lytes today.

## 2019-01-12 NOTE — Assessment & Plan Note (Signed)
Increase hctz to 25mg  daily and elevated. Will recheck in 3 months. Check renal and lytes.

## 2019-01-12 NOTE — Assessment & Plan Note (Signed)
Managed by ortho. Bakers cyst is stable

## 2019-01-12 NOTE — Assessment & Plan Note (Signed)
Per Dr. Trudie Reed, improved

## 2019-01-13 MED ORDER — ROSUVASTATIN CALCIUM 10 MG PO TABS: 10.0000 mg | ORAL_TABLET | Freq: Every day | ORAL | 3 refills | Status: DC

## 2019-01-13 NOTE — Progress Notes (Signed)
Please call patient: I have reviewed his/her lab results. Her labs are all stable. Cholesterol levels are high and a statin is indicated but she is refusing. We will recheck her potassium in 3 months on the HCTZ. No changed needed at this time.   The 10-year ASCVD risk score Mikey Bussing DC Brooke Bonito., et al., 2013) is: 18.8%   Values used to calculate the score:     Age: 73 years     Sex: Female     Is Non-Hispanic African American: Yes     Diabetic: No     Tobacco smoker: No     Systolic Blood Pressure: 349 mmHg     Is BP treated: Yes     HDL Cholesterol: 63.7 mg/dL     Total Cholesterol: 238 mg/dL

## 2019-01-13 NOTE — Addendum Note (Signed)
Addended by: Billey Chang on: 01/13/2019 11:06 AM   Modules accepted: Orders

## 2019-01-16 DIAGNOSIS — H401122 Primary open-angle glaucoma, left eye, moderate stage: Secondary | ICD-10-CM | POA: Diagnosis not present

## 2019-01-16 DIAGNOSIS — H401113 Primary open-angle glaucoma, right eye, severe stage: Secondary | ICD-10-CM | POA: Diagnosis not present

## 2019-01-31 ENCOUNTER — Ambulatory Visit: Payer: Medicare HMO | Admitting: Family Medicine

## 2019-02-02 DIAGNOSIS — M2550 Pain in unspecified joint: Secondary | ICD-10-CM | POA: Diagnosis not present

## 2019-02-02 DIAGNOSIS — M0579 Rheumatoid arthritis with rheumatoid factor of multiple sites without organ or systems involvement: Secondary | ICD-10-CM | POA: Diagnosis not present

## 2019-02-02 DIAGNOSIS — M150 Primary generalized (osteo)arthritis: Secondary | ICD-10-CM | POA: Diagnosis not present

## 2019-02-02 DIAGNOSIS — Z79899 Other long term (current) drug therapy: Secondary | ICD-10-CM | POA: Diagnosis not present

## 2019-02-03 ENCOUNTER — Other Ambulatory Visit: Payer: Self-pay | Admitting: Family Medicine

## 2019-02-20 ENCOUNTER — Telehealth: Payer: Self-pay | Admitting: Family Medicine

## 2019-02-20 NOTE — Telephone Encounter (Signed)
Unfortunately, needs evaluation to help understand what is causing her symptoms. Can't recommend treatment without further evaluation. Can get her set up to see if she qualifies for financial assistance - give her information? Or if mild sxs, can discuss with Dr. Trudie Reed at her next visit.

## 2019-02-20 NOTE — Telephone Encounter (Signed)
Copied from [place] [id number]. Topic: General - Other >> Feb 20, 2019  9:04 AM Carolyn Stare wrote:  Juluis Rainier pt call and said she wanted Dr Jonni Sanger to know that her feet falls asleep when she stands

## 2019-02-20 NOTE — Telephone Encounter (Signed)
She reports that this has been on going problem a yr ago but it is getting worse. Starting to affect to walking offered appt, she states she is not able to come in right now due to financial problems. She does also see a rheumatologist and has not discussed problem with them but states she has an appointment due with them soon. She is wanting to know what can be done.

## 2019-02-21 NOTE — Telephone Encounter (Signed)
Pt scheduled for 6/11

## 2019-02-23 ENCOUNTER — Encounter: Payer: Self-pay | Admitting: Family Medicine

## 2019-02-23 ENCOUNTER — Other Ambulatory Visit: Payer: Self-pay

## 2019-02-23 ENCOUNTER — Ambulatory Visit (INDEPENDENT_AMBULATORY_CARE_PROVIDER_SITE_OTHER): Payer: Medicare Other | Admitting: Family Medicine

## 2019-02-23 VITALS — BP 124/76 | HR 57 | Temp 98.0°F | Resp 16 | Ht 69.0 in | Wt 187.2 lb

## 2019-02-23 DIAGNOSIS — R600 Localized edema: Secondary | ICD-10-CM | POA: Diagnosis not present

## 2019-02-23 DIAGNOSIS — G6289 Other specified polyneuropathies: Secondary | ICD-10-CM | POA: Diagnosis not present

## 2019-02-23 DIAGNOSIS — D649 Anemia, unspecified: Secondary | ICD-10-CM

## 2019-02-23 LAB — VITAMIN D 25 HYDROXY (VIT D DEFICIENCY, FRACTURES): VITD: 33.33 ng/mL (ref 30.00–100.00)

## 2019-02-23 LAB — BASIC METABOLIC PANEL
BUN: 21 mg/dL (ref 6–23)
CO2: 29 mEq/L (ref 19–32)
Calcium: 9.4 mg/dL (ref 8.4–10.5)
Chloride: 105 mEq/L (ref 96–112)
Creatinine, Ser: 0.89 mg/dL (ref 0.40–1.20)
GFR: 75.39 mL/min (ref >60.00)
Glucose, Bld: 78 mg/dL (ref 70–99)
Potassium: 3.9 mEq/L (ref 3.5–5.1)
Sodium: 142 mEq/L (ref 135–145)

## 2019-02-23 LAB — B12 AND FOLATE PANEL
Folate: 20.3 ng/mL (ref >5.9)
Vitamin B-12: 632 pg/mL (ref 211–911)

## 2019-02-23 MED ORDER — GABAPENTIN 100 MG PO CAPS: 100.0000 mg | ORAL_CAPSULE | Freq: Three times a day (TID) | ORAL | 3 refills | Status: DC

## 2019-02-23 NOTE — Patient Instructions (Signed)
Please follow up as scheduled for your next visit with me: 04/13/2019   If you have any questions or concerns, please don't hesitate to send me a message via MyChart or call the office at [phone]. Thank you for visiting with [place] today! It's our pleasure caring for you.  Please start the gabapentin once nightly for 1-2 weeks. If tolerating, then can increase to twice a day and eventually 3x/day to see if will help the numbness.  I'm checking your vitamin levels today.

## 2019-02-23 NOTE — Progress Notes (Signed)
Subjective  CC:  Chief Complaint  Patient presents with  . Numbness    Bilateral feet, states feels like they go to sleep.. Causes walking and balance difficulty, states her feet will feel cold but warm to the touch. Has tried Vitamin B 1000 and 5000.    HPI: Brittany Stark is a 73 y.o. female who presents to the office today to address the problems listed above in the chief complaint.  As above, feel keep "falling asleep" . Now happening daily. Day and night. No pain, burning sensation or pin prick sensation but feel numb and tingling. No hand involvement. New problem that actually started about a year ago. This is the first time she has brought it up because it is getting worse and making it hard for her to walk well at times. No foot sores. No DM. Denies claudication. Chronic LE edema is controlled. No redness in legs or feet.   Anemia, unspecified and chronic. Reviewed old records. Ongoing for 2 years w/o eval. CRC screen is up to date. No GI blood loss sxs. Has RA, no ckd: ? Chronic disease anemia vs other.   Assessment  1. Other polyneuropathy   2. Normochromic anemia   3. Bilateral lower extremity edema      Plan   Peripheral neuropathy:  Decreased proprioception on exam. Check labs and start gabapentin.   Anemia: start evaluation. ? Chronic disease. R/o vitamin or iron defic  Edema is stable and non-contributory to neuropathy.   Follow up: Return for as scheduled.  04/13/2019  Orders Placed This Encounter  Procedures  . Iron, TIBC and Ferritin Panel  . B12 and Folate Panel  . Basic metabolic panel  . VITAMIN D 25 Hydroxy (Vit-D Deficiency, Fractures)   Meds ordered this encounter  Medications  . gabapentin (NEURONTIN) 100 MG capsule    Sig: Take 1 capsule (100 mg total) by mouth 3 (three) times daily.    Dispense:  90 capsule    Refill:  3      I reviewed the patients updated PMH, FH, and SocHx.    Patient Active Problem List   Diagnosis Date Noted  .  Essential hypertension 08/02/2018    Priority: High  . Mixed hyperlipidemia 08/02/2018    Priority: High  . Rheumatoid arthritis ([place]) 08/02/2018    Priority: High  . Osteoarthritis, multiple sites 08/02/2018    Priority: High  . Osteoporosis 08/02/2018    Priority: High  . Bilateral lower extremity edema 01/12/2019    Priority: Medium  . Fatty liver 08/02/2018    Priority: Medium  . History of kidney stones     Priority: Medium  . History of total knee replacement, right 12/01/2017    Priority: Medium  . Osteoarthritis of left knee 11/02/2016    Priority: Medium  . Hydronephrosis of right kidney 05/01/2014    Priority: Medium  . Diverticulosis 08/02/2018    Priority: Low  . Degeneration of lumbar intervertebral disc 08/23/2018  . Overactive bladder 08/02/2018   Current Meds  Medication Sig  . acetaminophen (TYLENOL) 500 MG tablet Take 500 mg by mouth every 6 (six) hours as needed. 2 in am 2 at night  . BIOTIN PO Take 1 capsule by mouth daily.  . COMBIGAN 0.2-0.5 % ophthalmic solution INT 1 GTT IN OU BID  . hydrochlorothiazide (HYDRODIURIL) 25 MG tablet Take 1 tablet (25 mg total) by mouth daily as needed (swelling in legs).  Marland Kitchen omeprazole (PRILOSEC) 20 MG capsule TAKE 1  CAPSULE(20 MG) BY MOUTH DAILY  . Pumpkin Seed-Soy Germ (AZO BLADDER CONTROL/GO-LESS PO) Take by mouth.  . rosuvastatin (CRESTOR) 10 MG tablet Take 1 tablet (10 mg total) by mouth daily.  Marland Kitchen Upadacitinib (RINVOQ PO) Rinvoq    Allergies: Patient is allergic to amoxicillin; arava [leflunomide]; doxycycline; and methotrexate derivatives. Family History: Patient family history includes Breast cancer in her maternal aunt and mother; Heart disease in her sister. Social History:  Patient  reports that she has never smoked. She has never used smokeless tobacco. She reports that she does not drink alcohol or use drugs.  Review of Systems: Constitutional: Negative for fever malaise or anorexia Cardiovascular:  negative for chest pain Respiratory: negative for SOB or persistent cough Gastrointestinal: negative for abdominal pain  Objective  Vitals: BP 124/76   Pulse (!) 57   Temp 98 F (36.7 C) (Oral)   Resp 16   Ht 5\' 9"  (1.753 m)   Wt 187 lb 3.2 oz (84.9 kg)   SpO2 98%   BMI 27.64 kg/m  General: no acute distress , A&Ox3 HEENT: PEERL, conjunctiva normal, Oropharynx moist,neck is supple Cardiovascular:  RRR with murmur  Respiratory:  Good breath sounds bilaterally, CTAB with normal respiratory effort Skin:  Warm, no rashes BLE with trace edema, no cords or redness, Pedal pulses are faint Sensation to light touch intact throughout but decreased proprioception bilaterally     Commons side effects, risks, benefits, and alternatives for medications and treatment plan prescribed today were discussed, and the patient expressed understanding of the given instructions. Patient is instructed to call or message via MyChart if he/she has any questions or concerns regarding our treatment plan. No barriers to understanding were identified. We discussed Red Flag symptoms and signs in detail. Patient expressed understanding regarding what to do in case of urgent or emergency type symptoms.   Medication list was reconciled, printed and provided to the patient in AVS. Patient instructions and summary information was reviewed with the patient as documented in the AVS. This note was prepared with assistance of Dragon voice recognition software. Occasional wrong-word or sound-a-like substitutions may have occurred due to the inherent limitations of voice recognition software

## 2019-02-24 ENCOUNTER — Encounter: Payer: Self-pay | Admitting: Family Medicine

## 2019-02-24 DIAGNOSIS — D638 Anemia in other chronic diseases classified elsewhere: Secondary | ICD-10-CM

## 2019-02-24 HISTORY — DX: Anemia in other chronic diseases classified elsewhere: D63.8

## 2019-02-24 LAB — IRON,TIBC AND FERRITIN PANEL
%SAT: 21 % (calc) (ref 16–45)
Ferritin: 111 ng/mL (ref 16–288)
Iron: 82 ug/dL (ref 45–160)
TIBC: 396 mcg/dL (calc) (ref 250–450)

## 2019-02-24 NOTE — Progress Notes (Signed)
Please call patient: I have reviewed his/her lab results. All lab results look good. Try the medication offered yesterday to see if things improve. Thanks.

## 2019-02-28 NOTE — Telephone Encounter (Signed)
Patient calling in stating she is still having an issue with being so tired. States she believes it is the gabapentin (NEURONTIN) 100 MG capsule. Patient states she only takes it at night and is worried about falling asleep while trying to go to work or doing other activities. Please advise.

## 2019-02-28 NOTE — Telephone Encounter (Signed)
Call pt to verify that she is only taking 1 pill at night, she reports that she is but is still feeling very sleepy.. Wants to know can she stop medication or what can she do. She reports that she can tell some improvement.

## 2019-02-28 NOTE — Telephone Encounter (Signed)
Try taking it earlier in the evening to see if she awakens less groggy.  If it is helping her, I'd like to see if we can get her used to it.   If she can't tolerate it, then we will need to stop it.

## 2019-02-28 NOTE — Telephone Encounter (Signed)
Pt aware and verbalized understanding, she will call back after trying it take medication earlier in the day.

## 2019-03-08 ENCOUNTER — Telehealth: Payer: Self-pay | Admitting: Family Medicine

## 2019-03-08 NOTE — Telephone Encounter (Signed)
Continue the gabapentin. Try adding melatonin 3mg  at night for sleep.

## 2019-03-08 NOTE — Telephone Encounter (Signed)
Spoke with pt she is aware and verbalized
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
## 2019-03-08 NOTE — Telephone Encounter (Signed)
Patient called and said that she would like a call back from Dr. Jonni Sanger or her CMA about problem she is having issues falling a sleep. Please call patient back, thanks.

## 2019-03-22 ENCOUNTER — Telehealth: Payer: Self-pay | Admitting: Family Medicine

## 2019-03-22 DIAGNOSIS — G6289 Other specified polyneuropathies: Secondary | ICD-10-CM

## 2019-03-22 NOTE — Telephone Encounter (Signed)
See note  Copied from [place] [id number]. Topic: General - Other >> Mar 22, 2019  9:12 AM Leward Quan A wrote: Reason for CRM: Patient called to inform Dr Jonni Sanger that the gabapentin (NEURONTIN) 100 MG capsule is not working and she would like to see some one that specialize in neuropathy asking for some help please. Ph# [phone]

## 2019-03-23 NOTE — Telephone Encounter (Signed)
Called and spoke with pt, she reports the only improvement is when she is laying in bed and not having the cold feet feeling anymore. She is aware to continue medication until Monday when Dr. Jonni Sanger returns and I will call her back with further instructions.  Please advise

## 2019-03-25 NOTE — Telephone Encounter (Signed)
Please refer to neurology for peripheral neuropathy evaluation. Thanks

## 2019-03-27 NOTE — Telephone Encounter (Signed)
Pt aware neuro referral made. Verbalized understanding

## 2019-03-27 NOTE — Addendum Note (Signed)
Addended by: Layla Barter on: 03/27/2019 08:06 AM   Modules accepted: Orders

## 2019-04-13 ENCOUNTER — Ambulatory Visit (INDEPENDENT_AMBULATORY_CARE_PROVIDER_SITE_OTHER): Payer: Medicare Other | Admitting: Family Medicine

## 2019-04-13 ENCOUNTER — Ambulatory Visit (INDEPENDENT_AMBULATORY_CARE_PROVIDER_SITE_OTHER): Payer: Medicare Other

## 2019-04-13 ENCOUNTER — Encounter: Payer: Self-pay | Admitting: Family Medicine

## 2019-04-13 ENCOUNTER — Other Ambulatory Visit: Payer: Self-pay

## 2019-04-13 VITALS — BP 122/82 | HR 63 | Temp 98.0°F | Resp 16 | Wt 192.8 lb

## 2019-04-13 DIAGNOSIS — M059 Rheumatoid arthritis with rheumatoid factor, unspecified: Secondary | ICD-10-CM

## 2019-04-13 DIAGNOSIS — R600 Localized edema: Secondary | ICD-10-CM

## 2019-04-13 DIAGNOSIS — D638 Anemia in other chronic diseases classified elsewhere: Secondary | ICD-10-CM

## 2019-04-13 DIAGNOSIS — R197 Diarrhea, unspecified: Secondary | ICD-10-CM | POA: Diagnosis not present

## 2019-04-13 LAB — POC HEMOCCULT BLD/STL (OFFICE/1-CARD/DIAGNOSTIC)
Card #1 Date: 7302002
Fecal Occult Blood, POC: NEGATIVE
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
## 2019-04-13 NOTE — Patient Instructions (Signed)
Please return in November for your complete physical, please come fasting.  Start immodium to help with your diarrhea. Let me know if it hasn't improved in the next 2 weeks.   If you have any questions or concerns, please don't hesitate to send me a message via MyChart or call the office at [phone]. Thank you for visiting with Brittany Stark today! It's our pleasure caring for you.   Diarrhea, Adult Diarrhea is frequent loose and watery bowel movements. Diarrhea can make you feel weak and cause you to become dehydrated. Dehydration can make you tired and thirsty, cause you to have a dry mouth, and decrease how often you urinate. Diarrhea typically lasts 2-3 days. However, it can last longer if it is a sign of something more serious. It is important to treat your diarrhea as told by your health care provider. Follow these instructions at home: Eating and drinking     Follow these recommendations as told by your health care provider:  Take an oral rehydration solution (ORS). This is an over-the-counter medicine that helps return your body to its normal balance of nutrients and water. It is found at pharmacies and retail stores.  Drink plenty of fluids, such as water, ice chips, diluted fruit juice, and low-calorie sports drinks. You can drink milk also, if desired.  Avoid drinking fluids that contain a lot of sugar or caffeine, such as energy drinks, sports drinks, and soda.  Eat bland, easy-to-digest foods in small amounts as you are able. These foods include bananas, applesauce, rice, lean meats, toast, and crackers.  Avoid alcohol.  Avoid spicy or fatty foods.  Medicines  Take over-the-counter and prescription medicines only as told by your health care provider.  If you were prescribed an antibiotic medicine, take it as told by your health care provider. Do not stop using the antibiotic even if you start to feel better. General instructions   Wash your hands often using soap and water.  If soap and water are not available, use a hand sanitizer. Others in the household should wash their hands as well. Hands should be washed: ? After using the toilet or changing a diaper. ? Before preparing, cooking, or serving food. ? While caring for a sick person or while visiting someone in a hospital.  Drink enough fluid to keep your urine pale yellow.  Rest at home while you recover.  Watch your condition for any changes.  Take a warm bath to relieve any burning or pain from frequent diarrhea episodes.  Keep all follow-up visits as told by your health care provider. This is important. Contact a health care provider if:  You have a fever.  Your diarrhea gets worse.  You have new symptoms.  You cannot keep fluids down.  You feel light-headed or dizzy.  You have a headache.  You have muscle cramps. Get help right away if:  You have chest pain.  You feel extremely weak or you faint.  You have bloody or black stools or stools that look like tar.  You have severe pain, cramping, or bloating in your abdomen.  You have trouble breathing or you are breathing very quickly.  Your heart is beating very quickly.  Your skin feels cold and clammy.  You feel confused.  You have signs of dehydration, such as: ? Dark urine, very little urine, or no urine. ? Cracked lips. ? Dry mouth. ? Sunken eyes. ? Sleepiness. ? Weakness. Summary  Diarrhea is frequent loose and watery bowel movements. Diarrhea  can make you feel weak and cause you to become dehydrated.  Drink enough fluids to keep your urine pale yellow.  Make sure that you wash your hands after using the toilet. If soap and water are not available, use hand sanitizer.  Contact a health care provider if your diarrhea gets worse or you have new symptoms.  Get help right away if you have signs of dehydration. This information is not intended to replace advice given to you by your health care provider. Make sure you  discuss any questions you have with your health care provider. Document Released: 08/21/2002 Document Revised: 02/04/2018 Document Reviewed: 02/04/2018 Elsevier Patient Education  2020 [employer].

## 2019-04-20 DIAGNOSIS — M150 Primary generalized (osteo)arthritis: Secondary | ICD-10-CM | POA: Diagnosis not present

## 2019-04-20 DIAGNOSIS — M2550 Pain in unspecified joint: Secondary | ICD-10-CM | POA: Diagnosis not present

## 2019-04-20 DIAGNOSIS — M0579 Rheumatoid arthritis with rheumatoid factor of multiple sites without organ or systems involvement: Secondary | ICD-10-CM | POA: Diagnosis not present

## 2019-04-20 DIAGNOSIS — Z79899 Other long term (current) drug therapy: Secondary | ICD-10-CM | POA: Diagnosis not present

## 2019-05-04 ENCOUNTER — Encounter: Payer: Self-pay | Admitting: Neurology

## 2019-05-04 ENCOUNTER — Other Ambulatory Visit: Payer: Self-pay

## 2019-05-04 ENCOUNTER — Ambulatory Visit (INDEPENDENT_AMBULATORY_CARE_PROVIDER_SITE_OTHER): Payer: Medicare Other | Admitting: Neurology

## 2019-05-04 ENCOUNTER — Telehealth: Payer: Self-pay | Admitting: Neurology

## 2019-05-04 VITALS — BP 129/77 | HR 59 | Temp 97.7°F | Ht 69.0 in | Wt 197.5 lb

## 2019-05-04 DIAGNOSIS — R531 Weakness: Secondary | ICD-10-CM | POA: Diagnosis not present

## 2019-05-04 DIAGNOSIS — R269 Unspecified abnormalities of gait and mobility: Secondary | ICD-10-CM | POA: Diagnosis not present

## 2019-05-04 DIAGNOSIS — G3281 Cerebellar ataxia in diseases classified elsewhere: Secondary | ICD-10-CM | POA: Diagnosis not present

## 2019-05-04 DIAGNOSIS — R202 Paresthesia of skin: Secondary | ICD-10-CM

## 2019-05-04 NOTE — Telephone Encounter (Signed)
[employer] medicare order sent to [facility]. No auth they will reach out to the patient to schedule.  

## 2019-05-04 NOTE — Progress Notes (Signed)
PATIENT: Brittany Stark DOB: 1945-11-14  Chief Complaint  Patient presents with  . Polyneuropathy    Reports bilateral feet feel cold but are warm to touch and numbness/tingling present.  She uses a cane to help her with walking.  Gabapentin 100mg  TID has not helped.   Marland Kitchen PCP    Leamon Arnt, MD     HISTORICAL  Brittany Stark is a 73 year old female, seen in request by her primary care doctor Leamon Arnt for evaluation of lateral lower extremity paresthesia, initial evaluation was on May 04, 2019.  I have reviewed and summarized the referring note from the referring physician.  She had past medical history of hypertension, hyperlipidemia, rheumatoid arthritis, presented with worsening bilateral lower extremity numbness tingling since 2017.  She had gradual onset gait abnormality since 2017 after knee replacement, symptoms gradually getting worse, now she described bottom of her feet numbness, crawling sensation at her leg, worsening gait abnormality, rely on her walker, she denies hand paresthesia, she has significant hand joints deformity, difficulty opening a jar.  She also complains of chronic constipation, urinary urgency chronic low back, neck pain  Laboratory evaluations in June 2020 showed normal vitamin D, BMP, B12, iron panel, TSH, lipid panel showed LDL 144, cholesterol of 238, CMP glucose of 128, CBC showed hemoglobin of 10.5  REVIEW OF SYSTEMS: Full 14 system review of systems performed and notable only for as above All other review of systems were negative.  ALLERGIES: Allergies  Allergen Reactions  . Amoxicillin Other (See Comments)    Yeast infection  Has patient had a PCN reaction causing immediate rash, facial/tongue/throat swelling, SOB or lightheadedness with hypotension: No Has patient had a PCN reaction causing severe rash involving mucus membranes or skin necrosis: No Has patient had a PCN reaction that required hospitalization No Has patient had  a PCN reaction occurring within the last 10 years: No If all of the above answers are "NO", then may proceed with Cephalosporin use.   Jolee Ewing [Leflunomide] Other (See Comments)    hairloss   . Doxycycline Itching and Other (See Comments)    Eyes red and tearing   . Methotrexate Derivatives Other (See Comments)    The injectable causes skin lesions     HOME MEDICATIONS: Current Outpatient Medications  Medication Sig Dispense Refill  . acetaminophen (TYLENOL) 500 MG tablet Take 500 mg by mouth every 6 (six) hours as needed. 2 in am 2 at night    . ASPIRIN 81 PO Take 1 tablet by mouth daily.    Marland Kitchen BIOTIN PO Take 1 capsule by mouth daily.    . COMBIGAN 0.2-0.5 % ophthalmic solution INT 1 GTT IN OU BID  3  . gabapentin (NEURONTIN) 100 MG capsule Take 1 capsule (100 mg total) by mouth 3 (three) times daily. 90 capsule 3  . hydrochlorothiazide (HYDRODIURIL) 25 MG tablet Take 1 tablet (25 mg total) by mouth daily as needed (swelling in legs). 90 tablet 1  . omeprazole (PRILOSEC) 20 MG capsule TAKE 1 CAPSULE(20 MG) BY MOUTH DAILY 90 capsule 0  . Phenazopyridine HCl (AZO URINARY PAIN RELIEF PO) Take by mouth as needed.    . rosuvastatin (CRESTOR) 10 MG tablet Take 1 tablet (10 mg total) by mouth daily. 90 tablet 3  . Travoprost, BAK Free, (TRAVATAN) 0.004 % SOLN ophthalmic solution Place 1 drop into both eyes at bedtime.    Marland Kitchen Upadacitinib (RINVOQ PO) Rinvoq     No current facility-administered medications  for this visit.     PAST MEDICAL HISTORY: Past Medical History:  Diagnosis Date  . Anemia of chronic disease 02/24/2019   Nl iron, vit b12 and folate 01/2019  . Arthritis    rheumatoid and osteoarthritis   . Depression   . Elevated cholesterol    currently under control  . GERD (gastroesophageal reflux disease)    laryngopharyngeal reflux  . Hemorrhoids   . History of kidney stones    hx of  . Hypertension   . Neuropathy   . Patellar tendon rupture, right, subsequent encounter  12/23/2016    PAST SURGICAL HISTORY: Past Surgical History:  Procedure Laterality Date  . ABDOMINAL HYSTERECTOMY    . BREAST SURGERY     breast reduction   . CATARACT EXTRACTION     both eyes  . COLONOSCOPY     hx polyps  . CYSTOSCOPY W/ RETROGRADES Right 05/01/2014   Procedure: CYSTOSCOPY WITH RIGHT RETROGRADE PYELOGRAM;  Surgeon: Malka So, MD;  Location: [facility];  Service: Urology;  Laterality: Right;  . CYSTOSCOPY WITH BIOPSY  05/01/2014   Procedure: CYSTOSCOPY WITH bladder BIOPSY;  Surgeon: Malka So, MD;  Location: [facility];  Service: Urology;;  . KNEE ARTHROSCOPY  1995  . ORIF PATELLA Right 12/23/2016   Procedure: RIGHT PATELLA TENDON REPAIR;  Surgeon: Gaynelle Arabian, MD;  Location: [facility];  Service: Orthopedics;  Laterality: Right;  . right ankle surgery      has pins   . right arm surgery      pins in right arm   . TOTAL KNEE ARTHROPLASTY Right 11/02/2016   Procedure: RIGHT TOTAL KNEE ARTHROPLASTY;  Surgeon: Gaynelle Arabian, MD;  Location: [facility];  Service: Orthopedics;  Laterality: Right;  with abductor block  . TOTAL KNEE ARTHROPLASTY Left 04/11/2018   Procedure: LEFT TOTAL KNEE ARTHROPLASTY;  Surgeon: Gaynelle Arabian, MD;  Location: [facility];  Service: Orthopedics;  Laterality: Left;  block    FAMILY HISTORY: Family History  Problem Relation Age of Onset  . Breast cancer Maternal Aunt   . Heart disease Sister   . Cancer Mother        unknown origin  . Stroke Father   . Colon cancer Neg Hx   . Esophageal cancer Neg Hx   . Rectal cancer Neg Hx   . Stomach cancer Neg Hx     SOCIAL HISTORY: Social History   Socioeconomic History  . Marital status: Divorced    Spouse name: Not on file  . Number of children: 2  . Years of education: 62  . Highest education level: High school graduate  Occupational History  . Occupation: Retired  Scientific laboratory technician  . Financial resource strain: Not on file  . Food insecurity    Worry: Not on file    Inability: Not on file  .  Transportation needs    Medical: Not on file    Non-medical: Not on file  Tobacco Use  . Smoking status: Never Smoker  . Smokeless tobacco: Never Used  Substance and Sexual Activity  . Alcohol use: No    Alcohol/week: 0.0 standard drinks  . Drug use: No  . Sexual activity: Not Currently  Lifestyle  . Physical activity    Days per week: Not on file    Minutes per session: Not on file  . Stress: Not on file  Relationships  . Social Herbalist on phone: Not on file    Gets together: Not on file  Attends religious service: Not on file    Active member of club or organization: Not on file    Attends meetings of clubs or organizations: Not on file    Relationship status: Not on file  . Intimate partner violence    Fear of current or ex partner: Not on file    Emotionally abused: Not on file    Physically abused: Not on file    Forced sexual activity: Not on file  Other Topics Concern  . Not on file  Social History Narrative   Lives at home alone.   Right-handed.   Occasional caffeine use.     PHYSICAL EXAM   Vitals:   05/04/19 0810  BP: 129/77  Pulse: (!) 59  Temp: 97.7 F (36.5 C)  Weight: 197 lb 8 oz (89.6 kg)  Height: 5\' 9"  (1.753 m)    Not recorded      Body mass index is 29.17 kg/m.  PHYSICAL EXAMNIATION:  Gen: NAD, conversant, well nourised, obese, well groomed                     Cardiovascular: Regular rate rhythm, no peripheral edema, warm, nontender. Eyes: Conjunctivae clear without exudates or hemorrhage Neck: Supple, no carotid bruits. Pulmonary: Clear to auscultation bilaterally   NEUROLOGICAL EXAM:  MENTAL STATUS: Speech:    Speech is normal; fluent and spontaneous with normal comprehension.  Cognition:     Orientation to time, place and person     Normal recent and remote memory     Normal Attention span and concentration     Normal Language, naming, repeating,spontaneous speech     Fund of knowledge   CRANIAL NERVES:  CN II: Visual fields are full to confrontation.  Pupils are round equal and briskly reactive to light. CN III, IV, VI: extraocular movement are normal. No ptosis. CN V: Facial sensation is intact to pinprick in all 3 divisions bilaterally. Corneal responses are intact.  CN VII: Face is symmetric with normal eye closure and smile. CN VIII: Hearing is normal to rubbing fingers CN IX, X: Palate elevates symmetrically. Phonation is normal. CN XI: Head turning and shoulder shrug are intact CN XII: Tongue is midline with normal movements and no atrophy.  MOTOR: Deformity of bilateral hands joints, she has no significant bilateral upper extremity proximal muscle weakness, no significant bilateral lower extremity proximal muscle weakness, mild right more than left toe extension flexion weakness  REFLEXES: Reflexes are 2+ and symmetric at the biceps, triceps, absent at knees, and ankles. Plantar responses are flexor.  SENSORY: Length dependent decreased to  light touch, pinprick, and vibratory sensation at ankle level.  COORDINATION: Rapid alternating movements and fine finger movements are intact. There is no dysmetria on finger-to-nose and heel-knee-shin.    GAIT/STANCE: She needs push-up to get up from seated position, wide-based, cautious, unsteady gait   DIAGNOSTIC DATA (LABS, IMAGING, TESTING) - I reviewed patient records, labs, notes, testing and imaging myself where available.   ASSESSMENT AND PLAN  SHEQUITA PEPLINSKI is a 73 y.o. female   Worsening gait abnormality,  bilateral lower extremity paresthesia, weakness  History of rheumatoid arthritis, significant deformity of bilateral hands joints,  Differentiation diagnosis including peripheral neuropathy, also need to rule out cervical spondylitic myelopathy, lumbosacral radiculopathy  Proceed with MRI of cervical, lumbar spine  EMG nerve conduction study  Laboratory evaluations  Referral to physical therapy   Marcial Pacas, M.D.  Ph.D.  [facility] [address]  [place], [place] [address] Ph: [phone] Fax: [phone]  CC: Leamon Arnt, MD

## 2019-05-08 ENCOUNTER — Telehealth: Payer: Self-pay | Admitting: Neurology

## 2019-05-08 LAB — PROTEIN ELECTROPHORESIS
A/G Ratio: 1 (ref 0.7–1.7)
Albumin ELP: 3.7 g/dL (ref 2.9–4.4)
Alpha 1: 0.2 g/dL (ref 0.0–0.4)
Alpha 2: 0.9 g/dL (ref 0.4–1.0)
Beta: 1.2 g/dL (ref 0.7–1.3)
Gamma Globulin: 1.3 g/dL (ref 0.4–1.8)
Globulin, Total: 3.6 g/dL (ref 2.2–3.9)
Total Protein: 7.3 g/dL (ref 6.0–8.5)

## 2019-05-08 LAB — ANA W/REFLEX IF POSITIVE
Anti JO-1: <0.2 AI (ref 0.0–0.9)
Anti Nuclear Antibody (ANA): POSITIVE — AB
Centromere Ab Screen: <0.2 AI (ref 0.0–0.9)
Chromatin Ab SerPl-aCnc: <0.2 AI (ref 0.0–0.9)
ENA RNP Ab: <0.2 AI (ref 0.0–0.9)
ENA SM Ab Ser-aCnc: <0.2 AI (ref 0.0–0.9)
ENA SSA (RO) Ab: >8 AI — ABNORMAL HIGH (ref 0.0–0.9)
ENA SSB (LA) Ab: >8 AI — ABNORMAL HIGH (ref 0.0–0.9)
Scleroderma (Scl-70) (ENA) Antibody, IgG: <0.2 AI (ref 0.0–0.9)
dsDNA Ab: <1 IU/mL (ref 0–9)

## 2019-05-08 LAB — HEMOGLOBIN A1C
Est. average glucose Bld gHb Est-mCnc: 108 mg/dL
Hgb A1c MFr Bld: 5.4 % (ref 4.8–5.6)

## 2019-05-08 LAB — SEDIMENTATION RATE: Sed Rate: 45 mm/hr — ABNORMAL HIGH (ref 0–40)

## 2019-05-08 LAB — C-REACTIVE PROTEIN: CRP: 2 mg/L (ref 0–10)

## 2019-05-08 LAB — COPPER, SERUM: Copper: 149 ug/dL (ref 72–166)

## 2019-05-08 MED ORDER — GABAPENTIN 300 MG PO CAPS: 300.0000 mg | ORAL_CAPSULE | Freq: Three times a day (TID) | ORAL | 11 refills | Status: DC

## 2019-05-08 NOTE — Telephone Encounter (Signed)
I spoke to the patient and she verbalized understanding of her results. 

## 2019-05-08 NOTE — Telephone Encounter (Signed)
Please let patient know, I have written gabapentin 300 mg 3 times daily

## 2019-05-08 NOTE — Telephone Encounter (Signed)
She would like to know if she can increase her gabapentin 100mg  TID to 200mg  TID.  She tried this over the weekend and it was helpful.

## 2019-05-08 NOTE — Telephone Encounter (Signed)
Please call patient, laboratory evaluation showed normal C-reactive protein, ESR was mildly elevated 45,  Positive ANA, SSA, SSB antibody, above findings consistent with her diagnosis of autoimmune disorder (usually consistent with Sjogren's disease, she has the diagnosis of rheumatoid arthritis)  Rest of the laboratory evaluation showed no significant abnormalities.

## 2019-05-08 NOTE — Addendum Note (Signed)
Addended by: Marcial Pacas on: 05/08/2019 06:02 PM   Modules accepted: Orders

## 2019-05-09 NOTE — Telephone Encounter (Signed)
The patient is agreeable to this plan and will start the higher dosage.  She will call us back with any further concerns.

## 2019-05-15 DIAGNOSIS — H401113 Primary open-angle glaucoma, right eye, severe stage: Secondary | ICD-10-CM | POA: Diagnosis not present

## 2019-05-15 DIAGNOSIS — H401122 Primary open-angle glaucoma, left eye, moderate stage: Secondary | ICD-10-CM | POA: Diagnosis not present

## 2019-05-15 DIAGNOSIS — H04123 Dry eye syndrome of bilateral lacrimal glands: Secondary | ICD-10-CM | POA: Diagnosis not present

## 2019-05-15 DIAGNOSIS — Z961 Presence of intraocular lens: Secondary | ICD-10-CM | POA: Diagnosis not present

## 2019-05-17 ENCOUNTER — Telehealth: Payer: Self-pay | Admitting: Neurology

## 2019-05-17 NOTE — Telephone Encounter (Signed)
I called patient and LVM to schedule NCV/EMG with Dr. Krista Blue.

## 2019-06-01 ENCOUNTER — Other Ambulatory Visit: Payer: Self-pay

## 2019-06-01 ENCOUNTER — Ambulatory Visit: Payer: Medicare Other

## 2019-06-01 ENCOUNTER — Encounter: Payer: Self-pay | Admitting: Physical Therapy

## 2019-06-01 ENCOUNTER — Ambulatory Visit: Payer: Medicare Other | Attending: Neurology | Admitting: Physical Therapy

## 2019-06-01 DIAGNOSIS — R2689 Other abnormalities of gait and mobility: Secondary | ICD-10-CM | POA: Diagnosis not present

## 2019-06-01 DIAGNOSIS — M6281 Muscle weakness (generalized): Secondary | ICD-10-CM | POA: Insufficient documentation

## 2019-06-01 DIAGNOSIS — R2681 Unsteadiness on feet: Secondary | ICD-10-CM | POA: Diagnosis not present

## 2019-06-02 NOTE — Therapy (Addendum)
[facility] [address] [place], [place], [address] Phone: [phone]   Fax:  [phone]  Physical Therapy Evaluation  Patient Details  Name: Brittany Stark MRN: [id number] Date of Birth: [birth year] Referring Provider (PT): Dr. Marcial Pacas   Encounter Date: 06/01/2019  PT End of Session - 06/01/19 2114    Visit Number  1    Number of Visits  9    Date for PT Re-Evaluation  07/01/19    Authorization Type  [employer] Medicare    Authorization Time Period  06-01-19 - 08-14-19    PT Start Time  0800    PT Stop Time  0847    PT Time Calculation (min)  47 min    Equipment Utilized During Treatment  --   heel lift placed in Rt shoe due to LLD   Activity Tolerance  Patient tolerated treatment well    Behavior During Therapy  [facility] for tasks assessed/performed       Past Medical History:  Diagnosis Date  . Anemia of chronic disease 02/24/2019   Nl iron, vit b12 and folate 01/2019  . Arthritis    rheumatoid and osteoarthritis   . Depression   . Elevated cholesterol    currently under control  . GERD (gastroesophageal reflux disease)    laryngopharyngeal reflux  . Hemorrhoids   . History of kidney stones    hx of  . Hypertension   . Neuropathy   . Patellar tendon rupture, right, subsequent encounter 12/23/2016    Past Surgical History:  Procedure Laterality Date  . ABDOMINAL HYSTERECTOMY    . BREAST SURGERY     breast reduction   . CATARACT EXTRACTION     both eyes  . COLONOSCOPY     hx polyps  . CYSTOSCOPY W/ RETROGRADES Right 05/01/2014   Procedure: CYSTOSCOPY WITH RIGHT RETROGRADE PYELOGRAM;  Surgeon: Malka So, MD;  Location: [facility];  Service: Urology;  Laterality: Right;  . CYSTOSCOPY WITH BIOPSY  05/01/2014   Procedure: CYSTOSCOPY WITH bladder BIOPSY;  Surgeon: Malka So, MD;  Location: [facility];  Service: Urology;;  . KNEE ARTHROSCOPY  1995  . ORIF PATELLA Right 12/23/2016   Procedure: RIGHT PATELLA TENDON  REPAIR;  Surgeon: Gaynelle Arabian, MD;  Location: [facility];  Service: Orthopedics;  Laterality: Right;  . right ankle surgery      has pins   . right arm surgery      pins in right arm   . TOTAL KNEE ARTHROPLASTY Right 11/02/2016   Procedure: RIGHT TOTAL KNEE ARTHROPLASTY;  Surgeon: Gaynelle Arabian, MD;  Location: [facility];  Service: Orthopedics;  Laterality: Right;  with abductor block  . TOTAL KNEE ARTHROPLASTY Left 04/11/2018   Procedure: LEFT TOTAL KNEE ARTHROPLASTY;  Surgeon: Gaynelle Arabian, MD;  Location: [facility];  Service: Orthopedics;  Laterality: Left;  block    There were no vitals filed for this visit.   Subjective Assessment - 06/01/19 0805    Subjective  Pt states Dr. Krista Blue told her to use her walker but she does not want to use and become dependent on it; pt is using [facility] today; pt states she received her RW after Rt TKR in Feb. 2018    Patient Stated Goals  to be able to walk outside without the cane    Currently in Pain?  Other (Comment)   pt states no pain but has numbness and tingling in bil. feet  [facility] PT Assessment - 06/02/19 0001      Assessment   Medical Diagnosis  Gait Abnormality    Referring Provider (PT)  Dr. Marcial Pacas    Onset Date/Surgical Date  --   2018 after Rt TKR; referral dated    Hand Dominance  Right    Next MD Visit  06-28-19 with Dr. Krista Blue for NCV & EMG tests; MRI scheduled on 06-11-19      Precautions   Precautions  Fall      Restrictions   Weight Bearing Restrictions  No      Balance Screen   Has the patient fallen in the past 6 months  No    Has the patient had a decrease in activity level because of a fear of falling?   No    Is the patient reluctant to leave their home because of a fear of falling?   No      Home Environment   Living Environment  Private residence    Type of [place] Access  Level entry    [place] - single point;Walker - 2 wheels      Prior Function   Level of  Independence  Independent with basic ADLs;Independent with household mobility without device;Independent with community mobility with device   uses RW to go from bed to bathroom   Vocation  Part time employment    Vocation Requirements  pt is a Actuary - works 4 hours/day    Leisure  no specific activities - "with the virus I'm scared"      ROM / Strength   AROM / PROM / Strength  Strength      Strength   Overall Strength  Deficits    Strength Assessment Site  Hip;Knee;Ankle    Right/Left Hip  Right;Left    Right Hip Flexion  4/5    Right Hip Extension  3+/5    Right Hip ABduction  3+/5    Left Hip Flexion  5/5    Left Hip Extension  3-/5    Left Hip ABduction  3+/5    Right/Left Knee  Right;Left    Right Knee Extension  4-/5    Left Knee Extension  5/5    Right/Left Ankle  Right;Left    Right Ankle Dorsiflexion  3+/5    Right Ankle Plantar Flexion  3-/5    Left Ankle Dorsiflexion  5/5    Left Ankle Plantar Flexion  3-/5      Transfers   Transfers  Sit to Stand    Comments  bil. UE support required from mat surface       Ambulation/Gait   Ambulation/Gait  Yes    Ambulation/Gait Assistance  6: Modified independent (Device/Increase time)    Ambulation Distance (Feet)  125 Feet    Assistive device  Straight cane    Gait Pattern  Trendelenburg;Right genu recurvatum;Decreased dorsiflexion - left    Ambulation Surface  Level;Indoor    Gait velocity  12.75 secs = 2.57 ft/sec with cane;     Gait Comments  heel lift placed in Rt shoe due to LLE - gait improved;;      Standardized Balance Assessment   Standardized Balance Assessment  Timed Up and Go Test      Timed Up and Go Test   Normal TUG (seconds)  17.72   with cane  Objective measurements completed on examination: See above findings.              PT Education - 06/01/19 2112    Education Details  eval results; shoe lift placed in Rt shoe due to LLD with RLE slightly shorter than LLE -  gait improved    Person(s) Educated  Patient    Methods  Explanation    Comprehension  Verbalized understanding;Returned demonstration          PT Long Term Goals - 06/02/19 1225      PT LONG TERM GOAL #1   Title  Increase TUG score form 17.72 secs to </= 14 secs with SPC for reduced fall risk.    Baseline  17.72 secs with SPC on 06-01-19    Time  4    Period  Weeks    Status  New    Target Date  07/01/19      PT LONG TERM GOAL #2   Title  Increase gait velocity from 2.57 ft/sec with SPC to >/= 2.8 ft/sec with cane for incr. gait efficiency.    Baseline  12.75 secs with SPC = 2.57 ft/sec on 06-01-19    Time  4    Period  Weeks    Status  New    Target Date  07/01/19      PT LONG TERM GOAL #3   Title  Assess need for orthosis as pt has Rt knee hyperextension and decreased Lt dorsiflexion during gait.    Baseline  heel lift placed in Rt shoe - 06-01-19    Time  4    Period  Weeks    Status  New    Target Date  07/01/19             Plan - 06/02/19 1220    Clinical Impression Statement  Pt presents with bil. LE weakness with gait deviations; pt has Trendelenburg gait due to bil. hip abdct. weakness and also Rt knee hyperextension in stance; heel lift placed in Rt shoe due to leg length discrepancy.  Gait pattern improved with use of heel lift. Pt  presents with balance deficits but declines use of RW at this time - pt using SPC.    Personal Factors and Comorbidities  Behavior Pattern;Comorbidity 2;Time since onset of injury/illness/exacerbation;Age    Comorbidities  RA - signficant deformity bil. hands and joints;  Differential dx - peripheral neuropathy, rule out cervical spondylitic myelopathy, Lumbosacral radiculopathy; Rt TKR 11-02-16;  Lt TKR 04-01-18; OP, OA, HTN, paresthesias bil. feet    Examination-Activity Limitations  Locomotion Level;Transfers;Stairs;Stand;Squat    Examination-Participation Restrictions  Meal Prep;Cleaning;Community Activity;Interpersonal  Relationship    Stability/Clinical Decision Making  Evolving/Moderate complexity    Clinical Decision Making  Moderate    Rehab Potential  Good    PT Frequency  2x / week    PT Duration  4 weeks    PT Treatment/Interventions  ADLs/Self Care Home Management;DME Instruction;Gait training;Stair training;Therapeutic activities;Therapeutic exercise;Balance training;Orthotic Fit/Training;Patient/family education;Neuromuscular re-education    PT Next Visit Plan  check heel lift in Rt shoe; begin balance HEP and strengthening exercises    Consulted and Agree with Plan of Care  Patient       Patient will benefit from skilled therapeutic intervention in order to improve the following deficits and impairments:  Abnormal gait, Decreased balance, Decreased strength, Decreased activity tolerance  Visit Diagnosis: Muscle weakness (generalized) - Plan: PT plan of care cert/re-cert  Other abnormalities of gait and mobility -  Plan: PT plan of care cert/re-cert  Unsteadiness on feet - Plan: PT plan of care cert/re-cert     Problem List Patient Active Problem List   Diagnosis Date Noted  . Gait abnormality 05/04/2019  . Weakness 05/04/2019  . Paresthesia 05/04/2019  . Anemia of chronic disease 02/24/2019  . Bilateral lower extremity edema 01/12/2019  . Degeneration of lumbar intervertebral disc 08/23/2018  . Essential hypertension 08/02/2018  . Mixed hyperlipidemia 08/02/2018  . Rheumatoid arthritis ([place]) 08/02/2018  . Osteoarthritis, multiple sites 08/02/2018  . Fatty liver 08/02/2018  . Diverticulosis 08/02/2018  . Osteoporosis 08/02/2018  . Overactive bladder 08/02/2018  . History of kidney stones   . History of total knee replacement, right 12/01/2017  . Osteoarthritis of left knee 11/02/2016  . Hydronephrosis of right kidney 05/01/2014    Alda Lea, PT 06/02/2019, 12:35 PM  [facility] [address] [place], [place], [address] Phone: [phone]   Fax:  [phone]  Name: Brittany Stark MRN: [id number] Date of Birth: 1946-05-16   PHYSICAL THERAPY DISCHARGE SUMMARY Visits:  1 Plan: Patient agrees to discharge.  Patient goals were not met. Patient is being discharged due to not returning since the last visit.  ?????     Lyndee Hensen, PT, DPT 2:43 PM  01/29/20

## 2019-06-05 ENCOUNTER — Telehealth: Payer: Self-pay | Admitting: Neurology

## 2019-06-05 NOTE — Telephone Encounter (Signed)
I have spoken with Dr. Krista Blue concerning this symptom.  The patient has pending appts for MRI lumbar, MRI cervical and NCV/EMG.  The plan will be to move forward with these tests.  I returned the call to the patient and left this information on her voicemail (ok per DPR).  Provided our number to call back with any further questions.

## 2019-06-05 NOTE — Telephone Encounter (Signed)
Pt states that she is having the feeling of her feet being wet, but when she touches them they are not wet. Pt would like to speak to RN. Please advise.

## 2019-06-05 NOTE — Telephone Encounter (Signed)
Patient called back and wanted to make Sharyn Lull RN aware she is getting her MRI on the 29th, the facility rs her. She just wants Sharyn Lull to be aware, no need to call back.

## 2019-06-06 ENCOUNTER — Other Ambulatory Visit: Payer: Medicare Other

## 2019-06-07 ENCOUNTER — Ambulatory Visit: Payer: Medicare Other

## 2019-06-08 ENCOUNTER — Ambulatory Visit (INDEPENDENT_AMBULATORY_CARE_PROVIDER_SITE_OTHER): Payer: Medicare Other

## 2019-06-08 ENCOUNTER — Encounter: Payer: Self-pay | Admitting: Family Medicine

## 2019-06-08 ENCOUNTER — Other Ambulatory Visit: Payer: Self-pay

## 2019-06-08 DIAGNOSIS — Z23 Encounter for immunization: Secondary | ICD-10-CM

## 2019-06-11 ENCOUNTER — Ambulatory Visit
Admission: RE | Admit: 2019-06-11 | Discharge: 2019-06-11 | Disposition: A | Discharge status: Home / Self Care | Payer: Medicare Other | Source: Ambulatory Visit | Attending: Neurology | Admitting: Neurology

## 2019-06-11 ENCOUNTER — Other Ambulatory Visit: Payer: Self-pay

## 2019-06-11 DIAGNOSIS — G3281 Cerebellar ataxia in diseases classified elsewhere: Secondary | ICD-10-CM

## 2019-06-12 ENCOUNTER — Telehealth: Payer: Self-pay | Admitting: Neurology

## 2019-06-12 NOTE — Telephone Encounter (Signed)
Please call patient, MRI of lumbar spine showed 30% chronic compression fracture of L2 vertebral body, multilevel degenerative changes, variable degree of foraminal narrowing, and there is no significant canal stenosis. MRI of cervical spine showed multilevel mild degenerative changes, there is no evidence of significant canal or foraminal narrowing.   IMPRESSION: This MRI of the lumbar spine shows the following: 1.  30% chronic compression fracture of the L2 vertebral body. 2.  At L4-L5, there is anterolisthesis and other degenerative changes causing foraminal narrowing but no definite nerve root compression or spinal stenosis. 3.  At L5-S1, there is minimal anterolisthesis and other degenerative changes causing moderately severe right foraminal narrowing and moderate left foraminal narrowing and right lateral recess stenosis.  There is potential for right L5 nerve root compression. 4.  There are milder degenerative changes at the other lumbar levels with less potential for nerve root compression. 5.  No spinal stenosis at any of the lumbar levels.  IMPRESSION: This MRI of the cervical spine without contrast shows the following: 1.    The spinal cord appears normal. 2.    There is mild spinal stenosis at C4-C5 and C5-C6 due to degenerative changes but no nerve root compression at these levels. 3.    Degenerative changes at other cervical levels do not cause spinal stenosis or nerve root compression.

## 2019-06-12 NOTE — Telephone Encounter (Signed)
I spoke with the patient and she verbalized understanding of the MRI results below.  She will keep her pending appt for NCV/EMG on 06/28/2019.

## 2019-06-15 ENCOUNTER — Ambulatory Visit: Payer: Medicare Other | Admitting: Physical Therapy

## 2019-06-22 ENCOUNTER — Ambulatory Visit: Payer: Medicare Other | Admitting: Physical Therapy

## 2019-06-28 ENCOUNTER — Ambulatory Visit (INDEPENDENT_AMBULATORY_CARE_PROVIDER_SITE_OTHER): Payer: Medicare Other | Admitting: Neurology

## 2019-06-28 ENCOUNTER — Other Ambulatory Visit: Payer: Self-pay

## 2019-06-28 ENCOUNTER — Ambulatory Visit: Payer: Medicare Other | Admitting: Neurology

## 2019-06-28 DIAGNOSIS — R531 Weakness: Secondary | ICD-10-CM

## 2019-06-28 DIAGNOSIS — R269 Unspecified abnormalities of gait and mobility: Secondary | ICD-10-CM

## 2019-06-28 DIAGNOSIS — M21371 Foot drop, right foot: Secondary | ICD-10-CM

## 2019-06-28 DIAGNOSIS — R202 Paresthesia of skin: Secondary | ICD-10-CM

## 2019-06-28 NOTE — Progress Notes (Signed)
PATIENT: Brittany Stark DOB: 10/29/1945  No chief complaint on file.    HISTORICAL  Brittany Stark is a 73 year old female, seen in request by her primary care doctor Leamon Arnt for evaluation of lateral lower extremity paresthesia, initial evaluation was on May 04, 2019.  I have reviewed and summarized the referring note from the referring physician.  She had past medical history of hypertension, hyperlipidemia, rheumatoid arthritis, presented with worsening bilateral lower extremity numbness tingling since 2017.  She had gradual onset gait abnormality since 2017 after knee replacement, symptoms gradually getting worse, now she described bottom of her feet numbness, crawling sensation at her leg, worsening gait abnormality, rely on her walker, she denies hand paresthesia, she has significant hand joints deformity, difficulty opening a jar.  She also complains of chronic constipation, urinary urgency chronic low back, neck pain  Laboratory evaluations in June 2020 showed normal vitamin D, BMP, B12, iron panel, TSH, lipid panel showed LDL 144, cholesterol of 238, CMP glucose of 128, CBC showed hemoglobin of 10.5  UPDATE Jun 28 2019: Patient return for EMG nerve conduction study today, which showed evidence of mild axonal sensorimotor polyneuropathy, there is also evidence of chronic right L4-5 lumbar radiculopathy.  We have personally reviewed MRI of lumbar in September 2020, 30% chronic compression fracture of L2, L5-S1, there is minimum anterolisthesis, causing moderate severe right foraminal narrowing and moderate left foraminal narrowing.  Potential compression of right L5 nerve roots. MRI of cervical spine, multilevel degenerative changes, no significant canal or foraminal narrowing.  She ambulate with a cane, has right foot drop, complains of bilateral foot paresthesia REVIEW OF SYSTEMS: Full 14 system review of systems performed and notable only for as above All other  review of systems were negative.  ALLERGIES: Allergies  Allergen Reactions  . Amoxicillin Other (See Comments)    Yeast infection  Has patient had a PCN reaction causing immediate rash, facial/tongue/throat swelling, SOB or lightheadedness with hypotension: No Has patient had a PCN reaction causing severe rash involving mucus membranes or skin necrosis: No Has patient had a PCN reaction that required hospitalization No Has patient had a PCN reaction occurring within the last 10 years: No If all of the above answers are "NO", then may proceed with Cephalosporin use.   Jolee Ewing [[place]unomide] Other (See Comments)    hairloss   . Doxycycline Itching and Other (See Comments)    Eyes red and tearing   . Methotrexate Derivatives Other (See Comments)    The injectable causes skin lesions     HOME MEDICATIONS: Current Outpatient Medications  Medication Sig Dispense Refill  . acetaminophen (TYLENOL) 500 MG tablet Take 500 mg by mouth every 6 (six) hours as needed. 2 in am 2 at night    . ASPIRIN 81 PO Take 1 tablet by mouth daily.    Marland Kitchen BIOTIN PO Take 1 capsule by mouth daily.    . COMBIGAN 0.2-0.5 % ophthalmic solution INT 1 GTT IN OU BID  3  . gabapentin (NEURONTIN) 300 MG capsule Take 1 capsule (300 mg total) by mouth 3 (three) times daily. 90 capsule 11  . hydrochlorothiazide (HYDRODIURIL) 25 MG tablet Take 1 tablet (25 mg total) by mouth daily as needed (swelling in legs). 90 tablet 1  . omeprazole (PRILOSEC) 20 MG capsule TAKE 1 CAPSULE(20 MG) BY MOUTH DAILY 90 capsule 0  . Phenazopyridine HCl (AZO URINARY PAIN RELIEF PO) Take by mouth as needed.    . rosuvastatin (CRESTOR)  10 MG tablet Take 1 tablet (10 mg total) by mouth daily. 90 tablet 3  . Travoprost, BAK Free, (TRAVATAN) 0.004 % SOLN ophthalmic solution Place 1 drop into both eyes at bedtime.    Marland Kitchen Upadacitinib (RINVOQ PO) Rinvoq     No current facility-administered medications for this visit.     PAST MEDICAL HISTORY: Past  Medical History:  Diagnosis Date  . Anemia of chronic disease 02/24/2019   Nl iron, vit b12 and folate 01/2019  . Arthritis    rheumatoid and osteoarthritis   . Depression   . Elevated cholesterol    currently under control  . GERD (gastroesophageal reflux disease)    laryngopharyngeal reflux  . Hemorrhoids   . History of kidney stones    hx of  . Hypertension   . Neuropathy   . Patellar tendon rupture, right, subsequent encounter 12/23/2016    PAST SURGICAL HISTORY: Past Surgical History:  Procedure Laterality Date  . ABDOMINAL HYSTERECTOMY    . BREAST SURGERY     breast reduction   . CATARACT EXTRACTION     both eyes  . COLONOSCOPY     hx polyps  . CYSTOSCOPY W/ RETROGRADES Right 05/01/2014   Procedure: CYSTOSCOPY WITH RIGHT RETROGRADE PYELOGRAM;  Surgeon: Malka So, MD;  [facility];  Service: Urology;  Laterality: Right;  . CYSTOSCOPY WITH BIOPSY  05/01/2014   Procedure: CYSTOSCOPY WITH bladder BIOPSY;  Surgeon: Malka So, MD;  [facility];  Service: Urology;;  . KNEE ARTHROSCOPY  1995  . ORIF PATELLA Right 12/23/2016   Procedure: RIGHT PATELLA TENDON REPAIR;  Surgeon: Gaynelle Arabian, MD;  [facility];  Service: Orthopedics;  Laterality: Right;  . right ankle surgery      has pins   . right arm surgery      pins in right arm   . TOTAL KNEE ARTHROPLASTY Right 11/02/2016   Procedure: RIGHT TOTAL KNEE ARTHROPLASTY;  Surgeon: Gaynelle Arabian, MD;  [facility];  Service: Orthopedics;  Laterality: Right;  with abductor block  . TOTAL KNEE ARTHROPLASTY Left 04/11/2018   Procedure: LEFT TOTAL KNEE ARTHROPLASTY;  Surgeon: Gaynelle Arabian, MD;  [facility];  Service: Orthopedics;  Laterality: Left;  block    FAMILY HISTORY: Family History  Problem Relation Age of Onset  . Breast cancer Maternal Aunt   . Heart disease Sister   . Cancer Mother        unknown origin  . Stroke Father   . Colon cancer Neg Hx   . Esophageal cancer Neg Hx   . Rectal  cancer Neg Hx   . Stomach cancer Neg Hx     SOCIAL HISTORY: Social History   Socioeconomic History  . Marital status: Divorced    Spouse name: Not on file  . Number of children: 2  . Years of education: 42  . Highest education level: High school graduate  Occupational History  . Occupation: Retired  Scientific laboratory technician  . Financial resource strain: Not on file  . Food insecurity    Worry: Not on file    Inability: Not on file  . Transportation needs    Medical: Not on file    Non-medical: Not on file  Tobacco Use  . Smoking status: Never Smoker  . Smokeless tobacco: Never Used  Substance and Sexual Activity  . Alcohol use: No    Alcohol/week: 0.0 standard drinks  . Drug use: No  . Sexual activity: Not Currently  Lifestyle  .  Physical activity    Days per week: Not on file    Minutes per session: Not on file  . Stress: Not on file  Relationships  . Social Herbalist on phone: Not on file    Gets together: Not on file    Attends religious service: Not on file    Active member of club or organization: Not on file    Attends meetings of clubs or organizations: Not on file    Relationship status: Not on file  . Intimate partner violence    Fear of current or ex partner: Not on file    Emotionally abused: Not on file    Physically abused: Not on file    Forced sexual activity: Not on file  Other Topics Concern  . Not on file  Social History Narrative   Lives at home alone.   Right-handed.   Occasional caffeine use.     PHYSICAL EXAM   There were no vitals filed for this visit.  Not recorded      There is no height or weight on file to calculate BMI.  PHYSICAL EXAMNIATION:  Gen: NAD, conversant, well nourised, well groomed                     Cardiovascular: Regular rate rhythm, no peripheral edema, warm, nontender. Eyes: Conjunctivae clear without exudates or hemorrhage Neck: Supple, no carotid bruits. Pulmonary: Clear to auscultation bilaterally    NEUROLOGICAL EXAM:  MENTAL STATUS: Speech:    Speech is normal; fluent and spontaneous with normal comprehension.  Cognition:     Orientation to time, place and person     Normal recent and remote memory     Normal Attention span and concentration     Normal Language, naming, repeating,spontaneous speech     Fund of knowledge   CRANIAL NERVES: CN II: Visual fields are full to confrontation.  Pupils are round equal and briskly reactive to light. CN III, IV, VI: extraocular movement are normal. No ptosis. CN V: Facial sensation is intact to pinprick in all 3 divisions bilaterally. Corneal responses are intact.  CN VII: Face is symmetric with normal eye closure and smile. CN VIII: Hearing is normal to rubbing fingers CN IX, X: Palate elevates symmetrically. Phonation is normal. CN XI: Head turning and shoulder shrug are intact CN XII: Tongue is midline with normal movements and no atrophy.  MOTOR: Deformity of bilateral hands joints, she has no significant bilateral upper extremity proximal muscle weakness, she had mild right knee extension weakness, mild right toe extension weakness.  REFLEXES: Reflexes are absent and symmetric at the biceps, triceps, absent at knees, and ankles. Plantar responses are flexor.  SENSORY: Length dependent decreased to  light touch, pinprick, and vibratory sensation at ankle level.  COORDINATION: Rapid alternating movements and fine finger movements are intact. There is no dysmetria on finger-to-nose and heel-knee-shin.    GAIT/STANCE: She needs push-up to get up from seated position, right foot drop, cautious   DIAGNOSTIC DATA (LABS, IMAGING, TESTING) - I reviewed patient records, labs, notes, testing and imaging myself where available.   ASSESSMENT AND PLAN  CARLETT FIRST is a 73 y.o. female   Worsening gait abnormality,  bilateral lower extremity paresthesia, weakness  History of rheumatoid arthritis, significant deformity of bilateral  hands joints,  Electrodiagnostic study today showed evidence of mild axonal sensorimotor polyneuropathy, there is also evidence of chronic right L4, L5 radiculopathy,  She does has right  foot drop, unsteady gait  She denies significant pain, no bowel and bladder incontinence, not a surgical candidate at this point,  Continue with physical therapy, right AFO,  Marcial Pacas, M.D. Ph.D.  [facility] [address], [place], [place] [address] Ph: [phone] Fax: [phone]  CC: Leamon Arnt, MD

## 2019-06-28 NOTE — Procedures (Signed)
Full Name: Brittany Stark Gender: Female MRN #: [id number] Date of Birth: 09/13/1946    Visit Date: 06/28/2019 07:16 Age: 73 Years 46 Months Old Examining Physician: Marcial Pacas, MD  Referring Physician: Marcial Pacas, MD History: 73 years old female, with history of bilateral knee replacement, rheumatoid arthritis, presenting with bilateral feet paresthesia, right foot drop, gait abnormality  Summary of the tests:  Nerve conduction study: Bilateral sural, right superficial peroneal sensory responses showed mildly decreased snap amplitude.  Left superficial peroneal sensory response was absent. Right ulnar sensory response showed mildly prolonged peak latency, with normal snap amplitude.  Right radial sensory response was normal.  Bilateral tibial and right peroneal to EDB motor responses were normal.  Left peroneal to EDB motor response showed significantly decreased CMAP amplitude, with mildly prolonged distal latency.  Right ulnar motor response showed mildly decreased CMAP amplitude, with low normal range conduction velocity.  Electromyography: Selected needle examinations were performed at bilateral lower extremity muscles, and bilateral lumbosacral paraspinal muscles.  The most significant abnormality was seen at the right tibialis anterior, there is evidence of increased insertional activity, 2+ spontaneous activity, with decreased recruitment patterns.  There is also mild chronic neuropathic changes involving right tibialis posterior, peroneal longus, biceps femoris short head.  There was no spontaneous activity at bilateral lumbosacral paraspinal muscles.  Conclusion: This is an abnormal study.  There is electrodiagnostic evidence of mild length dependent axonal sensorimotor polyneuropathy.  In addition, there is evidence of chronic right lumbar radiculopathy, mainly involving right L4-L5 myotomes.    ------------------------------- Marcial Pacas, M.D. PhD  [facility]  [facility] [place], [place] [address] Tel: [phone] Fax: [phone]        [facility]    Nerve / Sites Muscle Latency Ref. Amplitude Ref. Rel Amp Segments Distance Velocity Ref. Area    ms ms mV mV %  cm m/s m/s mVms  R Ulnar - ADM     Wrist ADM 3.1 ?3.3 5.7 ?6.0 100 Wrist - ADM 7   25.5     B.Elbow ADM 7.4  4.9  85.9 B.Elbow - Wrist 21 49 ?49 24.4     A.Elbow ADM 9.6  4.9  100 A.Elbow - B.Elbow 10 47 ?49 25.2         A.Elbow - Wrist      R Peroneal - EDB     Ankle EDB 5.7 ?6.5 2.3 ?2.0 100 Ankle - EDB 9   7.3     Fib head EDB 13.9  1.6  71.6 Fib head - Ankle 33 40 ?44 5.4     Pop fossa EDB 16.4  1.7  107 Pop fossa - Fib head 10 40 ?44 5.6         Pop fossa - Ankle      L Peroneal - EDB     Ankle EDB 7.0 ?6.5 0.3 ?2.0 100 Ankle - EDB 9   0.8     Fib head EDB 15.5  0.2  69.8 Fib head - Ankle 32 37 ?44 0.5     Pop fossa EDB 18.3  0.2  95.5 Pop fossa - Fib head 10 36 ?44 0.4         Pop fossa - Ankle      R Tibial - AH     Ankle AH 4.6 ?5.8 4.7 ?4.0 100 Ankle - AH 9   12.7     Pop fossa AH 17.2  0.8  16.2 Pop fossa -  Ankle 42 33 ?41 3.0  L Tibial - AH     Ankle AH 3.9 ?5.8 4.3 ?4.0 100 Ankle - AH 9   10.7     Pop fossa AH 13.8  2.6  60.5 Pop fossa - Ankle 41 41 ?41 7.5               SNC    Nerve / Sites Rec. Site Peak Lat Ref.  Amp Ref. Segments Distance    ms ms V V  cm  R Radial - Anatomical snuff box (Forearm)     Forearm Wrist 2.5 ?2.9 29 ?15 Forearm - Wrist 10  R Sural - Ankle (Calf)     Calf Ankle 3.6 ?4.4 4 ?6 Calf - Ankle 14  L Sural - Ankle (Calf)     Calf Ankle 3.9 ?4.4 3 ?6 Calf - Ankle 14  R Superficial peroneal - Ankle     Lat leg Ankle 3.8 ?4.4 3 ?6 Lat leg - Ankle 14  L Superficial peroneal - Ankle     Lat leg Ankle NR ?4.4 NR ?6 Lat leg - Ankle 14  R Ulnar - Orthodromic, (Dig V, Mid palm)     Dig V Wrist 3.4 ?3.1 6 ?5 Dig V - Wrist 77                  F  Wave    Nerve F Lat Ref.   ms ms  R Tibial - AH 60.6 ?56.0  L Tibial - AH  66.0 ?56.0  R Ulnar - ADM 34.4 ?32.0           EMG       EMG Summary Table    Spontaneous MUAP Recruitment  Muscle IA Fib PSW Fasc Other Amp Dur. Poly Pattern  R. Tibialis anterior Increased 2+ 2+ None _______ Increased Increased Normal Reduced  R. Tibialis posterior Increased None None None _______ Normal Normal Normal Reduced  R. Peroneus longus Increased None None None _______ Normal Normal Normal Reduced  R. Gastrocnemius (Medial head) Increased None None None _______ Normal Normal Normal Reduced  R. Vastus lateralis Normal None None None _______ Normal Normal Normal Reduced  R. Biceps femoris (short head) Normal None None None _______ Normal Normal Normal Reduced  R. Lumbar paraspinals (mid) Normal None None None _______ Normal Normal Normal Normal  R. Lumbar paraspinals (low) Normal None None None _______ Normal Normal Normal Normal  L. Tibialis anterior Normal None None None _______ Normal Normal Normal Reduced  L. Tibialis posterior Normal None None None _______ Normal Normal Normal Reduced  L. Peroneus longus Normal None None None _______ Normal Normal Normal Normal  L. Gastrocnemius (Medial head) Normal None None None _______ Normal Normal Normal Normal  L. Vastus lateralis Normal None None None _______ Normal Normal Normal Normal  L. Lumbar paraspinals (mid) Normal None None None _______ Normal Normal Normal Normal  L. Lumbar paraspinals (low) Normal None None None _______ Normal Normal Normal Normal

## 2019-07-04 ENCOUNTER — Ambulatory Visit: Payer: Medicare Other | Admitting: Physical Therapy

## 2019-07-06 ENCOUNTER — Ambulatory Visit: Payer: Medicare Other | Admitting: Physical Therapy

## 2019-07-13 DIAGNOSIS — G8310 Monoplegia of lower limb affecting unspecified side: Secondary | ICD-10-CM | POA: Diagnosis not present

## 2019-07-20 DIAGNOSIS — G8310 Monoplegia of lower limb affecting unspecified side: Secondary | ICD-10-CM | POA: Diagnosis not present

## 2019-07-25 DIAGNOSIS — G8310 Monoplegia of lower limb affecting unspecified side: Secondary | ICD-10-CM | POA: Diagnosis not present

## 2019-07-27 DIAGNOSIS — M0579 Rheumatoid arthritis with rheumatoid factor of multiple sites without organ or systems involvement: Secondary | ICD-10-CM | POA: Diagnosis not present

## 2019-07-27 DIAGNOSIS — M150 Primary generalized (osteo)arthritis: Secondary | ICD-10-CM | POA: Diagnosis not present

## 2019-07-27 DIAGNOSIS — G8310 Monoplegia of lower limb affecting unspecified side: Secondary | ICD-10-CM | POA: Diagnosis not present

## 2019-07-27 DIAGNOSIS — M2550 Pain in unspecified joint: Secondary | ICD-10-CM | POA: Diagnosis not present

## 2019-07-27 DIAGNOSIS — Z79899 Other long term (current) drug therapy: Secondary | ICD-10-CM | POA: Diagnosis not present

## 2019-08-01 DIAGNOSIS — G8310 Monoplegia of lower limb affecting unspecified side: Secondary | ICD-10-CM | POA: Diagnosis not present

## 2019-08-04 ENCOUNTER — Other Ambulatory Visit: Payer: Self-pay | Admitting: Family Medicine

## 2019-08-04 DIAGNOSIS — Z1231 Encounter for screening mammogram for malignant neoplasm of breast: Secondary | ICD-10-CM

## 2019-08-08 DIAGNOSIS — G8310 Monoplegia of lower limb affecting unspecified side: Secondary | ICD-10-CM | POA: Diagnosis not present

## 2019-08-17 DIAGNOSIS — M79651 Pain in right thigh: Secondary | ICD-10-CM | POA: Diagnosis not present

## 2019-08-17 DIAGNOSIS — Z96651 Presence of right artificial knee joint: Secondary | ICD-10-CM | POA: Diagnosis not present

## 2019-08-17 DIAGNOSIS — Z471 Aftercare following joint replacement surgery: Secondary | ICD-10-CM | POA: Diagnosis not present

## 2019-08-17 DIAGNOSIS — M25551 Pain in right hip: Secondary | ICD-10-CM | POA: Diagnosis not present

## 2019-08-25 DIAGNOSIS — M25551 Pain in right hip: Secondary | ICD-10-CM | POA: Diagnosis not present

## 2019-08-30 ENCOUNTER — Telehealth: Payer: Self-pay | Admitting: Family Medicine

## 2019-08-30 NOTE — Telephone Encounter (Signed)
I called the pt to schedule AWV with Loma Sousa.  She said that a nurse from [facility] came to her home last week for this visit, and she was given a paper to turn in to doctor when she sees [place] again.

## 2019-09-01 DIAGNOSIS — M7061 Trochanteric bursitis, right hip: Secondary | ICD-10-CM | POA: Diagnosis not present

## 2019-09-01 DIAGNOSIS — Z96651 Presence of right artificial knee joint: Secondary | ICD-10-CM | POA: Diagnosis not present

## 2019-09-01 DIAGNOSIS — M25551 Pain in right hip: Secondary | ICD-10-CM | POA: Diagnosis not present

## 2019-09-12 DIAGNOSIS — H04123 Dry eye syndrome of bilateral lacrimal glands: Secondary | ICD-10-CM | POA: Diagnosis not present

## 2019-09-12 DIAGNOSIS — Z961 Presence of intraocular lens: Secondary | ICD-10-CM | POA: Diagnosis not present

## 2019-09-12 DIAGNOSIS — H401122 Primary open-angle glaucoma, left eye, moderate stage: Secondary | ICD-10-CM | POA: Diagnosis not present

## 2019-09-12 DIAGNOSIS — H401113 Primary open-angle glaucoma, right eye, severe stage: Secondary | ICD-10-CM | POA: Diagnosis not present

## 2019-09-28 DIAGNOSIS — M79651 Pain in right thigh: Secondary | ICD-10-CM | POA: Diagnosis not present

## 2019-09-28 DIAGNOSIS — M545 Low back pain: Secondary | ICD-10-CM | POA: Diagnosis not present

## 2019-09-28 DIAGNOSIS — M25551 Pain in right hip: Secondary | ICD-10-CM | POA: Diagnosis not present

## 2019-10-02 ENCOUNTER — Encounter: Payer: Self-pay | Admitting: Family Medicine

## 2019-10-05 ENCOUNTER — Ambulatory Visit
Admission: RE | Admit: 2019-10-05 | Discharge: 2019-10-05 | Disposition: A | Discharge status: Home / Self Care | Payer: Medicare Other | Source: Ambulatory Visit | Attending: Family Medicine | Admitting: Family Medicine

## 2019-10-05 ENCOUNTER — Other Ambulatory Visit: Payer: Self-pay

## 2019-10-05 DIAGNOSIS — Z1231 Encounter for screening mammogram for malignant neoplasm of breast: Secondary | ICD-10-CM

## 2019-10-06 DIAGNOSIS — M545 Low back pain: Secondary | ICD-10-CM | POA: Diagnosis not present

## 2019-10-19 DIAGNOSIS — M5136 Other intervertebral disc degeneration, lumbar region: Secondary | ICD-10-CM | POA: Diagnosis not present

## 2019-10-19 DIAGNOSIS — M25551 Pain in right hip: Secondary | ICD-10-CM | POA: Diagnosis not present

## 2019-10-28 ENCOUNTER — Ambulatory Visit: Payer: Medicare Other | Attending: Internal Medicine

## 2019-10-28 DIAGNOSIS — Z23 Encounter for immunization: Secondary | ICD-10-CM | POA: Insufficient documentation

## 2019-10-28 NOTE — Progress Notes (Signed)
   Covid-19 Vaccination Clinic  Name:  Brittany Stark    MRN: [id number] DOB: 14-Dec-1945  10/28/2019  [name] was observed post Covid-19 immunization for 15 minutes without incidence. She was provided with Vaccine Information Sheet and instruction to access the V-Safe system.   [name] was instructed to call 911 with any severe reactions post vaccine: Marland Kitchen Difficulty breathing  . Swelling of your face and throat  . A fast heartbeat  . A bad rash all over your body  . Dizziness and weakness    Immunizations Administered    Name Date Dose VIS Date Route   Moderna COVID-19 Vaccine 10/28/2019 11:31 AM 0.5 mL 08/15/2019 Intramuscular   Manufacturer: Moderna   Lot: [id number]   [id number]

## 2019-10-29 ENCOUNTER — Ambulatory Visit: Payer: Medicare Other

## 2019-11-23 DIAGNOSIS — Z96651 Presence of right artificial knee joint: Secondary | ICD-10-CM | POA: Diagnosis not present

## 2019-11-23 DIAGNOSIS — S8001XA Contusion of right knee, initial encounter: Secondary | ICD-10-CM | POA: Diagnosis not present

## 2019-11-25 ENCOUNTER — Ambulatory Visit: Payer: Medicare Other | Attending: Internal Medicine

## 2019-11-25 DIAGNOSIS — Z23 Encounter for immunization: Secondary | ICD-10-CM

## 2019-11-25 NOTE — Progress Notes (Signed)
   Covid-19 Vaccination Clinic  Name:  Brittany Stark    MRN: [id number] DOB: 1946-08-27  11/25/2019  [name] was observed post Covid-19 immunization for 15 minutes without incident. She was provided with Vaccine Information Sheet and instruction to access the V-Safe system.   [name] was instructed to call 911 with any severe reactions post vaccine: Marland Kitchen Difficulty breathing  . Swelling of face and throat  . A fast heartbeat  . A bad rash all over body  . Dizziness and weakness   Immunizations Administered    Name Date Dose VIS Date Route   Moderna COVID-19 Vaccine 11/25/2019  9:48 AM 0.5 mL 08/15/2019 Intramuscular   Manufacturer: Moderna   Lot: [id number]   [place]:[id number]

## 2019-12-21 DIAGNOSIS — Z96651 Presence of right artificial knee joint: Secondary | ICD-10-CM | POA: Diagnosis not present

## 2020-01-04 DIAGNOSIS — Z13220 Encounter for screening for lipoid disorders: Secondary | ICD-10-CM | POA: Diagnosis not present

## 2020-01-04 DIAGNOSIS — Z79899 Other long term (current) drug therapy: Secondary | ICD-10-CM | POA: Diagnosis not present

## 2020-01-04 DIAGNOSIS — M0579 Rheumatoid arthritis with rheumatoid factor of multiple sites without organ or systems involvement: Secondary | ICD-10-CM | POA: Diagnosis not present

## 2020-01-04 DIAGNOSIS — M2550 Pain in unspecified joint: Secondary | ICD-10-CM | POA: Diagnosis not present

## 2020-01-04 DIAGNOSIS — M150 Primary generalized (osteo)arthritis: Secondary | ICD-10-CM | POA: Diagnosis not present

## 2020-01-10 DIAGNOSIS — H04123 Dry eye syndrome of bilateral lacrimal glands: Secondary | ICD-10-CM | POA: Diagnosis not present

## 2020-01-10 DIAGNOSIS — H401113 Primary open-angle glaucoma, right eye, severe stage: Secondary | ICD-10-CM | POA: Diagnosis not present

## 2020-01-10 DIAGNOSIS — Z961 Presence of intraocular lens: Secondary | ICD-10-CM | POA: Diagnosis not present

## 2020-01-10 DIAGNOSIS — H401122 Primary open-angle glaucoma, left eye, moderate stage: Secondary | ICD-10-CM | POA: Diagnosis not present

## 2020-01-16 ENCOUNTER — Other Ambulatory Visit: Payer: Self-pay | Admitting: Family Medicine

## 2020-01-16 ENCOUNTER — Other Ambulatory Visit: Payer: Self-pay

## 2020-01-16 MED ORDER — HYDROCHLOROTHIAZIDE 25 MG PO TABS: 25.0000 mg | ORAL_TABLET | Freq: Every day | ORAL | 1 refills | Status: DC | PRN

## 2020-01-16 NOTE — Telephone Encounter (Signed)
Received a fax from [employer] [facility] where the patient is requesting a refill on hydrochlorothiazide (HYDRODIURIL) 25 MG tablet. Medication has been sent to the patient's pharmacy.

## 2020-01-29 ENCOUNTER — Other Ambulatory Visit: Payer: Self-pay

## 2020-01-30 ENCOUNTER — Ambulatory Visit (INDEPENDENT_AMBULATORY_CARE_PROVIDER_SITE_OTHER): Payer: Medicare Other | Admitting: Family Medicine

## 2020-01-30 ENCOUNTER — Encounter: Payer: Self-pay | Admitting: Family Medicine

## 2020-01-30 VITALS — BP 104/78 | HR 73 | Temp 97.2°F | Resp 16 | Ht 69.0 in | Wt 191.2 lb

## 2020-01-30 DIAGNOSIS — M810 Age-related osteoporosis without current pathological fracture: Secondary | ICD-10-CM | POA: Diagnosis not present

## 2020-01-30 DIAGNOSIS — K760 Fatty (change of) liver, not elsewhere classified: Secondary | ICD-10-CM

## 2020-01-30 DIAGNOSIS — G629 Polyneuropathy, unspecified: Secondary | ICD-10-CM

## 2020-01-30 DIAGNOSIS — I10 Essential (primary) hypertension: Secondary | ICD-10-CM

## 2020-01-30 DIAGNOSIS — E782 Mixed hyperlipidemia: Secondary | ICD-10-CM

## 2020-01-30 DIAGNOSIS — K5909 Other constipation: Secondary | ICD-10-CM

## 2020-01-30 DIAGNOSIS — M159 Polyosteoarthritis, unspecified: Secondary | ICD-10-CM

## 2020-01-30 DIAGNOSIS — M8949 Other hypertrophic osteoarthropathy, multiple sites: Secondary | ICD-10-CM

## 2020-01-30 DIAGNOSIS — M059 Rheumatoid arthritis with rheumatoid factor, unspecified: Secondary | ICD-10-CM

## 2020-01-30 DIAGNOSIS — R269 Unspecified abnormalities of gait and mobility: Secondary | ICD-10-CM | POA: Diagnosis not present

## 2020-01-30 HISTORY — DX: Polyneuropathy, unspecified: G62.9

## 2020-01-30 LAB — CBC WITH DIFFERENTIAL/PLATELET
Basophils Absolute: 0 10*3/uL (ref 0.0–0.1)
Basophils Relative: 0.8 % (ref 0.0–3.0)
Eosinophils Absolute: 0 10*3/uL (ref 0.0–0.7)
Eosinophils Relative: 0.9 % (ref 0.0–5.0)
HCT: 35.2 % — ABNORMAL LOW (ref 36.0–46.0)
Hemoglobin: 11.5 g/dL — ABNORMAL LOW (ref 12.0–15.0)
Lymphocytes Relative: 36.3 % (ref 12.0–46.0)
Lymphs Abs: 1.9 10*3/uL (ref 0.7–4.0)
MCHC: 32.8 g/dL (ref 30.0–36.0)
MCV: 100.5 fl — ABNORMAL HIGH (ref 78.0–100.0)
Monocytes Absolute: 0.8 10*3/uL (ref 0.1–1.0)
Monocytes Relative: 14.5 % — ABNORMAL HIGH (ref 3.0–12.0)
Neutro Abs: 2.5 10*3/uL (ref 1.4–7.7)
Neutrophils Relative %: 47.5 % (ref 43.0–77.0)
Platelets: 240 10*3/uL (ref 150.0–400.0)
RBC: 3.5 Mil/uL — ABNORMAL LOW (ref 3.87–5.11)
RDW: 14.9 % (ref 11.5–15.5)
WBC: 5.2 10*3/uL (ref 4.0–10.5)

## 2020-01-30 LAB — LIPID PANEL
Cholesterol: 174 mg/dL (ref 0–200)
HDL: 55 mg/dL (ref >39.00)
LDL Cholesterol: 81 mg/dL (ref 0–99)
NonHDL: 118.65
Total CHOL/HDL Ratio: 3
Triglycerides: 189 mg/dL — ABNORMAL HIGH (ref 0.0–149.0)
VLDL: 37.8 mg/dL (ref 0.0–40.0)

## 2020-01-30 LAB — COMPREHENSIVE METABOLIC PANEL
ALT: 12 U/L (ref 0–35)
AST: 19 U/L (ref 0–37)
Albumin: 4 g/dL (ref 3.5–5.2)
Alkaline Phosphatase: 80 U/L (ref 39–117)
BUN: 21 mg/dL (ref 6–23)
CO2: 32 mEq/L (ref 19–32)
Calcium: 9.2 mg/dL (ref 8.4–10.5)
Chloride: 105 mEq/L (ref 96–112)
Creatinine, Ser: 1.01 mg/dL (ref 0.40–1.20)
GFR: 64.98 mL/min (ref >60.00)
Glucose, Bld: 90 mg/dL (ref 70–99)
Potassium: 3.8 mEq/L (ref 3.5–5.1)
Sodium: 142 mEq/L (ref 135–145)
Total Bilirubin: 0.4 mg/dL (ref 0.2–1.2)
Total Protein: 7.4 g/dL (ref 6.0–8.3)

## 2020-01-30 NOTE — Progress Notes (Signed)
Subjective  CC:  Chief Complaint  Patient presents with  . bowel movements    patient states that she has experienced constipation and takes stool softeners to help move bowels which leads to uncontrollable bowel movements/ diarrhea, has seen some blood in her stools  . Hypertension    checking BP at home 130/70's, stopped taking hydrochlorothiazide for a few months, but recently restarted in the last month    HPI: Brittany Stark is a 74 y.o. female who presents to the office today to address the problems listed above in the chief complaint.  Hypertension f/u: Control is good . Pt reports she is doing well. taking medications as instructed, no medication side effects noted, no TIAs, no chest pain on exertion, no dyspnea on exertion, no swelling of ankles. She had stopped her meds due to her friend telling her it causes cancer but Bps were high at home off the med. Now controlled again.  She denies adverse effects from his BP medications. Overdue for labs and lipids. Denies sxs of orthostatic hypotension  HLD on statin w/o AEs. nonfasting today  C/o constipation for most of her life. Uses colace. But thinks hemorrhoids act up when her stool gets too soft. No abdominal pain or diarrhea. No f/c/s. Has occ bright blood in stool or when she wipes from hemorrhoids. CRC screens are up to date. No weight loss. Eats well with good appetite.   RA and gait abnormality with peripheral neuropathy. Stable on gabapentin. No recent falls. Uses a cane to ambulate  Osteoporosis at wrist on last dexa 2019; reviewed results. Due forrecheck. Has been off fosamax since 2019. Reportedly took it for 2 years   H/o fatty liver. No pain.   Assessment  1. Essential hypertension   2. Peripheral polyneuropathy   3. Chronic constipation   4. Rheumatoid arthritis with positive rheumatoid factor, involving unspecified site ([place])   5. Gait abnormality   6. Mixed hyperlipidemia   7. Age-related osteoporosis without  current pathological fracture   8. Primary osteoarthritis involving multiple joints   9. Fatty liver      Plan    Hypertension f/u: BP control is well controlled. Continue hctz and Check renal function and electrolytes.  Neuropathy and gait abnormality is stable. Fall precautions reviewed  Hyperlipidemia f/u: recheck nonfasting labs today on statin  Constipation, chronic. Add miralax prn. See avs for instructions.   Hemorrhoids: reassured. otc meds as needed. Want to avoid hard stool  Osteoporosis: pt to schedule dexa for recheck. Will restart treatment if indicated. Avoid calcium supp for now due to constipation  Fatty liver: recheck lfts  OA: stable.   Education regarding management of these chronic disease states was given. Management strategies discussed on successive visits include dietary and exercise recommendations, goals of achieving and maintaining IBW, and lifestyle modifications aiming for adequate sleep and minimizing stressors.   Follow up: No follow-ups on file.  Orders Placed This Encounter  Procedures  . DG Bone Density  . CBC with Differential/Platelet  . Comprehensive metabolic panel  . Lipid panel  . TSH  . VITAMIN D 25 Hydroxy (Vit-D Deficiency, Fractures)   No orders of the defined types were placed in this encounter.     BP Readings from Last 3 Encounters:  01/30/20 104/78  05/04/19 129/77  04/13/19 122/82   Wt Readings from Last 3 Encounters:  01/30/20 191 lb 3.2 oz (86.7 kg)  05/04/19 197 lb 8 oz (89.6 kg)  04/13/19 192 lb 12.8 oz (  87.5 kg)    Lab Results  Component Value Date   CHOL 238 (H) 01/12/2019   Lab Results  Component Value Date   HDL 63.70 01/12/2019   Lab Results  Component Value Date   LDLCALC 144 (H) 01/12/2019   Lab Results  Component Value Date   TRIG 153.0 (H) 01/12/2019   Lab Results  Component Value Date   CHOLHDL 4 01/12/2019   No results found for: LDLDIRECT Lab Results  Component Value Date    CREATININE 0.89 02/23/2019   BUN 21 02/23/2019   NA 142 02/23/2019   K 3.9 02/23/2019   CL 105 02/23/2019   CO2 29 02/23/2019    The 10-year ASCVD risk score Mikey Bussing DC Jr., et al., 2013) is: 12.1%   Values used to calculate the score:     Age: 67 years     Sex: Female     Is Non-Hispanic African American: Yes     Diabetic: No     Tobacco smoker: No     Systolic Blood Pressure: 123456 mmHg     Is BP treated: Yes     HDL Cholesterol: 63.7 mg/dL     Total Cholesterol: 238 mg/dL  I reviewed the patients updated PMH, FH, and SocHx.    Patient Active Problem List   Diagnosis Date Noted  . Gait abnormality 05/04/2019    Priority: High  . Essential hypertension 08/02/2018    Priority: High  . Mixed hyperlipidemia 08/02/2018    Priority: High  . Rheumatoid arthritis ([facility]) 08/02/2018    Priority: High  . Osteoarthritis, multiple sites 08/02/2018    Priority: High  . Osteoporosis 08/02/2018    Priority: High  . Peripheral neuropathy 01/30/2020    Priority: Medium  . Chronic constipation 01/30/2020    Priority: Medium  . Right foot drop 06/28/2019    Priority: Medium  . Anemia of chronic disease 02/24/2019    Priority: Medium  . Bilateral lower extremity edema 01/12/2019    Priority: Medium  . Degeneration of lumbar intervertebral disc 08/23/2018    Priority: Medium  . Fatty liver 08/02/2018    Priority: Medium  . Overactive bladder 08/02/2018    Priority: Medium  . History of kidney stones     Priority: Medium  . History of total knee replacement, right 12/01/2017    Priority: Medium  . Osteoarthritis of left knee 11/02/2016    Priority: Medium  . Hydronephrosis of right kidney 05/01/2014    Priority: Medium  . Diverticulosis 08/02/2018    Priority: Low    Allergies: Amoxicillin, Arava [leflunomide], Doxycycline, and Methotrexate derivatives  Social History: Patient  reports that she has never smoked. She has never used smokeless tobacco. She reports that she does  not drink alcohol or use drugs.  Current Meds  Medication Sig  . acetaminophen (TYLENOL) 500 MG tablet Take 500 mg by mouth every 6 (six) hours as needed. 2 in am 2 at night  . ASPIRIN 81 PO Take 1 tablet by mouth daily.  Marland Kitchen BIOTIN PO Take 1 capsule by mouth daily.  . COMBIGAN 0.2-0.5 % ophthalmic solution INT 1 GTT IN OU BID  . gabapentin (NEURONTIN) 300 MG capsule Take 1 capsule (300 mg total) by mouth 3 (three) times daily.  . hydrochlorothiazide (HYDRODIURIL) 25 MG tablet Take 1 tablet (25 mg total) by mouth daily as needed (swelling in legs).  Marland Kitchen omeprazole (PRILOSEC) 20 MG capsule TAKE 1 CAPSULE(20 MG) BY MOUTH DAILY  . rosuvastatin (CRESTOR) 10  MG tablet TAKE 1 TABLET(10 MG) BY MOUTH DAILY  . Travoprost, BAK Free, (TRAVATAN) 0.004 % SOLN ophthalmic solution Place 1 drop into both eyes at bedtime.  Marland Kitchen Upadacitinib (RINVOQ PO) Rinvoq    Review of Systems: Cardiovascular: negative for chest pain, palpitations, leg swelling, orthopnea Respiratory: negative for SOB, wheezing or persistent cough Gastrointestinal: negative for abdominal pain Genitourinary: negative for dysuria or gross hematuria  Objective  Vitals: BP 104/78   Pulse 73   Temp (!) 97.2 F (36.2 C) (Temporal)   Resp 16   Ht 5\' 9"  ([id number] m)   Wt 191 lb 3.2 oz (86.7 kg)   SpO2 98%   BMI 28.24 kg/m  General: no acute distress , looks good Psych:  Alert and oriented, normal mood and affect HEENT:  Normocephalic, atraumatic, supple neck  Cardiovascular:  RRR without murmur. no edema Respiratory:  Good breath sounds bilaterally, CTAB with normal respiratory effort Gastrointestinal: soft, flat abdomen, nontender, normal active bowel sounds, no palpable masses Neurologic:   Mental status is normal   Commons side effects, risks, benefits, and alternatives for medications and treatment plan prescribed today were discussed, and the patient expressed understanding of the given instructions. Patient is instructed to call or  message via MyChart if he/she has any questions or concerns regarding our treatment plan. No barriers to understanding were identified. We discussed Red Flag symptoms and signs in detail. Patient expressed understanding regarding what to do in case of urgent or emergency type symptoms.   Medication list was reconciled, printed and provided to the patient in AVS. Patient instructions and summary information was reviewed with the patient as documented in the AVS. This note was prepared with assistance of Dragon voice recognition software. Occasional wrong-word or sound-a-like substitutions may have occurred due to the inherent limitations of voice recognition software  This visit occurred during the SARS-CoV-2 public health emergency.  Safety protocols were in place, including screening questions prior to the visit, additional usage of staff PPE, and extensive cleaning of exam room while observing appropriate contact time as indicated for disinfecting solutions.

## 2020-01-30 NOTE — Patient Instructions (Addendum)
Please return in 6 months for your annual complete physical; please come fasting.  Start using otc miralax once a day as needed to help with your constipation. It should help move the bowels. Use the colace to keep the stool soft; if it gets too soft, hold the colace and use it as needed.  I have ordered a mammogram and/or bone density for you as we discussed today: []   Mammogram  [x]   Bone Density  Please call the office checked below to schedule your appointment: Your appointment will at the following location  [x]   [facility]      [place], [place]         []   [facility]  [place], [place]  Make sure to wear two peace clothing  No lotions powders or deodorants the day of the appointment Make sure to bring picture ID and insurance card.  Bring list of medications you are currently taking including any supplements.    If you have any questions or concerns, please don't hesitate to send me a message via MyChart or call the office at [phone]. Thank you for visiting with [place] today! It's our pleasure caring for you.   Constipation, Adult Constipation is when a person has fewer bowel movements in a week than normal, has difficulty having a bowel movement, or has stools that are dry, hard, or larger than normal. Constipation may be caused by an underlying condition. It may become worse with age if a person takes certain medicines and does not take in enough fluids. Follow these instructions at home: Eating and drinking   Eat foods that have a lot of fiber, such as fresh fruits and vegetables, whole grains, and beans.  Limit foods that are high in fat, low in fiber, or overly processed, such as french fries, hamburgers, cookies, candies, and soda.  Drink enough fluid to keep your urine clear or pale yellow. General instructions  Exercise regularly or as told by your  health care provider.  Go to the restroom when you have the urge to go. Do not hold it in.  Take over-the-counter and prescription medicines only as told by your health care provider. These include any fiber supplements.  Practice pelvic floor retraining exercises, such as deep breathing while relaxing the lower abdomen and pelvic floor relaxation during bowel movements.  Watch your condition for any changes.  Keep all follow-up visits as told by your health care provider. This is important. Contact a health care provider if:  You have pain that gets worse.  You have a fever.  You do not have a bowel movement after 4 days.  You vomit.  You are not hungry.  You lose weight.  You are bleeding from the anus.  You have thin, pencil-like stools. Get help right away if:  You have a fever and your symptoms suddenly get worse.  You leak stool or have blood in your stool.  Your abdomen is bloated.  You have severe pain in your abdomen.  You feel dizzy or you faint. This information is not intended to replace advice given to you by your health care provider. Make sure you discuss any questions you have with your health care provider. Document Revised: 08/13/2017 Document Reviewed: 02/19/2016 Elsevier Patient Education  2020 [employer].

## 2020-01-31 LAB — TSH: TSH: 1.57 u[IU]/mL (ref 0.35–4.50)

## 2020-01-31 LAB — VITAMIN D 25 HYDROXY (VIT D DEFICIENCY, FRACTURES): VITD: 25.62 ng/mL — ABNORMAL LOW (ref 30.00–100.00)

## 2020-01-31 NOTE — Progress Notes (Signed)
Please call patient: I have reviewed his/her lab results. Everything looks good! Vit D is a little low so please restart OTC Vit D 4000 units daily. This is important for your bone health.

## 2020-02-06 ENCOUNTER — Other Ambulatory Visit: Payer: Self-pay

## 2020-02-06 ENCOUNTER — Ambulatory Visit
Admission: RE | Admit: 2020-02-06 | Discharge: 2020-02-06 | Disposition: A | Discharge status: Home / Self Care | Payer: Medicare Other | Source: Ambulatory Visit | Attending: Family Medicine | Admitting: Family Medicine

## 2020-02-06 DIAGNOSIS — Z780 Asymptomatic menopausal state: Secondary | ICD-10-CM | POA: Diagnosis not present

## 2020-02-06 DIAGNOSIS — M810 Age-related osteoporosis without current pathological fracture: Secondary | ICD-10-CM

## 2020-02-06 DIAGNOSIS — M8589 Other specified disorders of bone density and structure, multiple sites: Secondary | ICD-10-CM | POA: Diagnosis not present

## 2020-02-06 NOTE — Progress Notes (Signed)
Please call patient: I have reviewed his/her Dexa  results. Bone density looks improved. In the osteopenic category at this time; her prior medications helped. OK to monitor for now on calcium, vit D and exercise. Will recheck in 2 years.

## 2020-03-06 ENCOUNTER — Telehealth: Payer: Self-pay | Admitting: Family Medicine

## 2020-03-06 NOTE — Telephone Encounter (Signed)
Patient states that here recently that she has been having some bad balance issues, states that she has not fell but did read that gabapentin has this side effect, patient would like to know if she can stop taking it or if she needs to come in to see Dr.Andy.

## 2020-03-06 NOTE — Telephone Encounter (Signed)
Please advise 

## 2020-03-06 NOTE — Telephone Encounter (Signed)
She can stop taking this and see if issues resolve, but if they do not then would follow up with dr. Jonni Sanger. She may need to see dr. Jonni Sanger after stopping medication if her neuropathy gets worse as well.  Dr. Rogers Blocker

## 2020-03-06 NOTE — Telephone Encounter (Signed)
Patient notified of recommendations per Dr. Rogers Blocker. Agreeable to calling and making appt with Dr. Jonni Sanger if balance issues are not resolved after stopping Gabapentin

## 2020-04-03 ENCOUNTER — Other Ambulatory Visit: Payer: Self-pay | Admitting: Family Medicine

## 2020-04-04 DIAGNOSIS — Z79899 Other long term (current) drug therapy: Secondary | ICD-10-CM | POA: Diagnosis not present

## 2020-04-04 DIAGNOSIS — M0579 Rheumatoid arthritis with rheumatoid factor of multiple sites without organ or systems involvement: Secondary | ICD-10-CM | POA: Diagnosis not present

## 2020-04-04 DIAGNOSIS — M2550 Pain in unspecified joint: Secondary | ICD-10-CM | POA: Diagnosis not present

## 2020-04-04 DIAGNOSIS — M150 Primary generalized (osteo)arthritis: Secondary | ICD-10-CM | POA: Diagnosis not present

## 2020-04-22 ENCOUNTER — Telehealth: Payer: Self-pay | Admitting: Family Medicine

## 2020-04-22 NOTE — Telephone Encounter (Signed)
error 

## 2020-04-26 ENCOUNTER — Ambulatory Visit: Payer: Medicare Other | Admitting: Physician Assistant

## 2020-04-29 DIAGNOSIS — R300 Dysuria: Secondary | ICD-10-CM | POA: Diagnosis not present

## 2020-05-15 DIAGNOSIS — R300 Dysuria: Secondary | ICD-10-CM | POA: Diagnosis not present

## 2020-05-15 DIAGNOSIS — R8271 Bacteriuria: Secondary | ICD-10-CM | POA: Diagnosis not present

## 2020-05-29 DIAGNOSIS — Z961 Presence of intraocular lens: Secondary | ICD-10-CM | POA: Diagnosis not present

## 2020-05-29 DIAGNOSIS — H401113 Primary open-angle glaucoma, right eye, severe stage: Secondary | ICD-10-CM | POA: Diagnosis not present

## 2020-05-29 DIAGNOSIS — H04123 Dry eye syndrome of bilateral lacrimal glands: Secondary | ICD-10-CM | POA: Diagnosis not present

## 2020-05-29 DIAGNOSIS — H401122 Primary open-angle glaucoma, left eye, moderate stage: Secondary | ICD-10-CM | POA: Diagnosis not present

## 2020-06-11 DIAGNOSIS — M25561 Pain in right knee: Secondary | ICD-10-CM | POA: Diagnosis not present

## 2020-06-25 DIAGNOSIS — M222X1 Patellofemoral disorders, right knee: Secondary | ICD-10-CM | POA: Diagnosis not present

## 2020-07-03 DIAGNOSIS — M0579 Rheumatoid arthritis with rheumatoid factor of multiple sites without organ or systems involvement: Secondary | ICD-10-CM | POA: Diagnosis not present

## 2020-07-03 DIAGNOSIS — M150 Primary generalized (osteo)arthritis: Secondary | ICD-10-CM | POA: Diagnosis not present

## 2020-07-03 DIAGNOSIS — Z79899 Other long term (current) drug therapy: Secondary | ICD-10-CM | POA: Diagnosis not present

## 2020-07-03 DIAGNOSIS — M2550 Pain in unspecified joint: Secondary | ICD-10-CM | POA: Diagnosis not present

## 2020-07-08 ENCOUNTER — Other Ambulatory Visit: Payer: Self-pay | Admitting: Family Medicine

## 2020-07-09 DIAGNOSIS — R8271 Bacteriuria: Secondary | ICD-10-CM | POA: Diagnosis not present

## 2020-07-18 ENCOUNTER — Other Ambulatory Visit: Payer: Self-pay | Admitting: Family Medicine

## 2020-07-24 DIAGNOSIS — R3915 Urgency of urination: Secondary | ICD-10-CM | POA: Diagnosis not present

## 2020-08-01 ENCOUNTER — Ambulatory Visit: Payer: Medicare Other | Admitting: Family Medicine

## 2020-08-21 ENCOUNTER — Other Ambulatory Visit: Payer: Self-pay

## 2020-08-21 ENCOUNTER — Encounter: Payer: Self-pay | Admitting: Family Medicine

## 2020-08-21 ENCOUNTER — Ambulatory Visit (INDEPENDENT_AMBULATORY_CARE_PROVIDER_SITE_OTHER): Payer: Medicare Other | Admitting: Family Medicine

## 2020-08-21 VITALS — BP 127/79 | HR 60 | Temp 98.2°F | Ht 69.0 in | Wt 190.8 lb

## 2020-08-21 DIAGNOSIS — Z1159 Encounter for screening for other viral diseases: Secondary | ICD-10-CM

## 2020-08-21 DIAGNOSIS — G629 Polyneuropathy, unspecified: Secondary | ICD-10-CM

## 2020-08-21 DIAGNOSIS — I10 Essential (primary) hypertension: Secondary | ICD-10-CM | POA: Diagnosis not present

## 2020-08-21 DIAGNOSIS — E782 Mixed hyperlipidemia: Secondary | ICD-10-CM | POA: Diagnosis not present

## 2020-08-21 DIAGNOSIS — K760 Fatty (change of) liver, not elsewhere classified: Secondary | ICD-10-CM | POA: Diagnosis not present

## 2020-08-21 DIAGNOSIS — M059 Rheumatoid arthritis with rheumatoid factor, unspecified: Secondary | ICD-10-CM

## 2020-08-21 DIAGNOSIS — Z23 Encounter for immunization: Secondary | ICD-10-CM | POA: Diagnosis not present

## 2020-08-21 DIAGNOSIS — Z0000 Encounter for general adult medical examination without abnormal findings: Secondary | ICD-10-CM

## 2020-08-21 DIAGNOSIS — E559 Vitamin D deficiency, unspecified: Secondary | ICD-10-CM | POA: Insufficient documentation

## 2020-08-21 DIAGNOSIS — N3281 Overactive bladder: Secondary | ICD-10-CM

## 2020-08-21 DIAGNOSIS — D638 Anemia in other chronic diseases classified elsewhere: Secondary | ICD-10-CM

## 2020-08-21 DIAGNOSIS — M810 Age-related osteoporosis without current pathological fracture: Secondary | ICD-10-CM

## 2020-08-21 MED ORDER — OXYBUTYNIN CHLORIDE ER 10 MG PO TB24: 10.0000 mg | ORAL_TABLET | Freq: Every day | ORAL | 3 refills | Status: DC

## 2020-08-21 NOTE — Progress Notes (Signed)
Subjective  Chief Complaint  Patient presents with  . Annual Exam    fasting   . Insomnia    HPI: Brittany Stark is a 74 y.o. female who presents to [facility] [place] today for a Female Wellness Visit. She also has the concerns and/or needs as listed above in the chief complaint. These will be addressed in addition to the Health Maintenance Visit.   Wellness Visit: annual visit with health maintenance review and exam without Pap   Health maintenance: Screens are up-to-date.  Due Pneumovax today.  Reviewed most recent bone density noted stable osteopenia.  Vitamin D levels are borderline low and she is taking over-the-counter medication.  She feels well.  No pain.  No mood complaints she continues to work and keeps active. Chronic disease f/u and/or acute problem visit: (deemed necessary to be done in addition to the wellness visit):  Hypertension uncontrolled on current medications without adverse effects.  No chest pain or shortness of breath.  No lower extremity edema.  Mixed hyperlipidemia on statin without adverse effects.  Fasting for recheck.  Has been well controlled.  No history of atherosclerotic disease or vascular disease.  No diabetes.  Rheumatoid arthritis without pain on management of her rheumatology.  No active joint symptoms.  Anemia of chronic disease has been stable.  Polyneuropathy by imaging studies no longer on gabapentin.  Denies pain.  He has gait abnormality but uses cane.  Overactive bladder: She did see urology and they started a medication but she was unable to afford it.  Admits to getting up from the bathroom 5-6 times nightly.  No irritative symptoms.  No infection noted at urology office.  Assessment  1. Annual physical exam   2. Essential hypertension   3. Mixed hyperlipidemia   4. Rheumatoid arthritis with positive rheumatoid factor, involving unspecified site ([place])   5. Fatty liver   6. Anemia of chronic disease   7.  Age-related osteoporosis without current pathological fracture   8. Peripheral polyneuropathy   9. Need for hepatitis C screening test   10. Vitamin D deficiency   11. Overactive bladder      Plan  Female Wellness Visit:  Age appropriate Health Maintenance and Prevention measures were discussed with patient. Included topics are cancer screening recommendations, ways to keep healthy (see AVS) including dietary and exercise recommendations, regular eye and dental care, use of seat belts, and avoidance of moderate alcohol use and tobacco use.  Screens are up-to-date  BMI: discussed patient's BMI and encouraged positive lifestyle modifications to help get to or maintain a target BMI.  HM needs and immunizations were addressed and ordered. See below for orders. See HM and immunization section for updates.  Pneumovax given  Routine labs and screening tests ordered including cmp, cbc and lipids where appropriate.  Discussed recommendations regarding Vit D and calcium supplementation (see AVS)  Chronic disease management visit and/or acute problem visit:  Overactive bladder: Trial Ditropan.  Education given.  Blood pressure and lipids have been well controlled.  [place] lab work.  Continue current medications.  History of osteoporosis improved on this treatment, now with stable osteopenia.  Recheck bone density in 2 years.  Continue vitamin D and calcium levels.  Recheck vitamin D level today.  Rheumatoid arthritis per rheumatology.  Anemia of chronic disease has been stable.  Recheck today.  Asymptomatic peripheral neuropathy.  Monitor   Follow up: 6 months for follow-up hypertension recheck Orders Placed This Encounter  Procedures  .  Hepatitis C antibody  . VITAMIN D 25 Hydroxy (Vit-D Deficiency, Fractures)  . COMPLETE METABOLIC PANEL WITH GFR  . CBC with Differential/Platelet  . Lipid panel   Meds ordered this encounter  Medications  . oxybutynin (DITROPAN XL) 10 MG 24 hr  tablet    Sig: Take 1 tablet (10 mg total) by mouth at bedtime.    Dispense:  90 tablet    Refill:  3      Lifestyle: Body mass index is 28.18 kg/m. Wt Readings from Last 3 Encounters:  08/21/20 190 lb 12.8 oz (86.5 kg)  01/30/20 191 lb 3.2 oz (86.7 kg)  05/04/19 197 lb 8 oz (89.6 kg)    Patient Active Problem List   Diagnosis Date Noted  . Gait abnormality 05/04/2019    Priority: High  . Essential hypertension 08/02/2018    Priority: High    Stopped low-dose amlodipine November 2019   . Mixed hyperlipidemia 08/02/2018    Priority: High    Stopped statin November 2019.   Marland Kitchen Rheumatoid arthritis ([place]) 08/02/2018    Priority: High  . Osteoarthritis, multiple sites 08/02/2018    Priority: High  . Osteoporosis 08/02/2018    Priority: High    dexa 2018: osteoporosis at wrist and h/o adult fracture. Patient stopped biphosphonate November 2019, after 2 years of use. Dexa 2019: osteopenia. Pt elects holding off on more treatment at this time. rcheck 2 years. Dexa 2021: osteopenia T = -2.1; recheck 2 years    . Peripheral neuropathy 01/30/2020    Priority: Medium    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 Evaluated by Dr. Krista Blue, neuro 2020   . Chronic constipation 01/30/2020    Priority: Medium  . Right foot drop 06/28/2019    Priority: Medium  . Anemia of chronic disease 02/24/2019    Priority: Medium    Nl iron, vit b12 and folate 01/2019   . Bilateral lower extremity edema 01/12/2019    Priority: Medium  . Degeneration of lumbar intervertebral disc 08/23/2018    Priority: Medium  . Fatty liver 08/02/2018    Priority: Medium  . Overactive bladder 08/02/2018    Priority: Medium  . History of kidney stones     Priority: Medium    hx of   . History of total knee replacement, right 12/01/2017    Priority: Medium  . Osteoarthritis of left knee 11/02/2016    Priority: Medium  . Hydronephrosis of right kidney 05/01/2014    Priority: Medium   . Diverticulosis 08/02/2018    Priority: Low  . Vitamin D deficiency 08/21/2020   Health Maintenance  Topic Date Due  . Hepatitis C Screening  Never done  . TETANUS/TDAP  Never done  . PNA vac Low Risk Adult (2 of 2 - PPSV23) 09/04/2015  . MAMMOGRAM  10/04/2020  . DEXA SCAN  02/05/2022  . COLONOSCOPY  10/01/2027  . INFLUENZA VACCINE  Completed  . COVID-19 Vaccine  Completed   Immunization History  Administered Date(s) Administered  . Fluad Quad(high Dose 65+) 06/08/2019  . Influenza, Quadrivalent, Recombinant, Inj, Pf 06/23/2018  . Influenza,inj,quad, With Preservative 07/15/2017  . Influenza-Unspecified 06/14/2020  . Moderna SARS-COVID-2 Vaccination 10/28/2019, 11/25/2019, 07/17/2020  . Pneumococcal Conjugate-13 09/03/2014  . Zoster Recombinat (Shingrix) 02/24/2018, 05/28/2018   We updated and reviewed the patient's past history in detail and it is documented below. Allergies: Patient is allergic to amoxicillin, arava [leflunomide], doxycycline, and methotrexate derivatives. Past Medical History Patient  has a past medical history of  Anemia of chronic disease (02/24/2019), Arthritis, Depression, Elevated cholesterol, GERD (gastroesophageal reflux disease), Hemorrhoids, History of kidney stones, Hypertension, Neuropathy, Patellar tendon rupture, right, subsequent encounter (12/23/2016), and Peripheral neuropathy (01/30/2020). Past Surgical History Patient  has a past surgical history that includes Abdominal hysterectomy; Breast surgery; right ankle surgery ; right arm surgery ; Cystoscopy w/ retrogrades (Right, 05/01/2014); Cystoscopy with biopsy (05/01/2014); Cataract extraction; Knee arthroscopy (1995); Colonoscopy; Total knee arthroplasty (Right, 11/02/2016); ORIF patella (Right, 12/23/2016); and Total knee arthroplasty (Left, 04/11/2018). Family History: Patient family history includes Breast cancer in her maternal aunt; Cancer in her mother; Heart disease in her sister; Stroke in her  father. Social History:  Patient  reports that she has never smoked. She has never used smokeless tobacco. She reports that she does not drink alcohol and does not use drugs.  Review of Systems: Constitutional: negative for fever or malaise Ophthalmic: negative for photophobia, double vision or loss of vision Cardiovascular: negative for chest pain, dyspnea on exertion, or new LE swelling Respiratory: negative for SOB or persistent cough Gastrointestinal: negative for abdominal pain, change in bowel habits or melena Genitourinary: negative for dysuria or gross hematuria, no abnormal uterine bleeding or disharge Musculoskeletal: negative for new gait disturbance or muscular weakness Integumentary: negative for new or persistent rashes, no breast lumps Neurological: negative for TIA or stroke symptoms Psychiatric: negative for SI or delusions Allergic/Immunologic: negative for hives  Patient Care Team    Relationship Specialty Notifications Start End  Leamon Arnt, MD PCP - General Family Medicine  08/04/18   Gaynelle Arabian, MD Consulting Physician Orthopedic Surgery  08/02/18   Irene Shipper, MD Consulting Physician Gastroenterology  08/02/18   Irine Seal, MD Attending Physician Urology  08/02/18     Objective  Vitals: BP 127/79   Pulse 60   Temp 98.2 F (36.8 C) (Temporal)   Ht 5\' 9"  (1.753 m)   Wt 190 lb 12.8 oz (86.5 kg)   SpO2 98%   BMI 28.18 kg/m  General:  Well developed, well nourished, no acute distress  Psych:  Alert and orientedx3,normal mood and affect HEENT:  Normocephalic, atraumatic, non-icteric sclera,  supple neck without adenopathy, mass or thyromegaly Cardiovascular:  Normal S1, S2, RRR without gallop, rub or murmur Respiratory:  Good breath sounds bilaterally, CTAB with normal respiratory effort Gastrointestinal: normal bowel sounds, soft, non-tender, no noted masses. No HSM MSK: RA changes in hands, no contusions. Joints are without erythema or  swelling.  Skin:  Warm, no rashes or suspicious lesions noted Neurologic:    Mental status is normal. CN 2-11 are normal. Gross motor and sensory exams are normal.     Commons side effects, risks, benefits, and alternatives for medications and treatment plan prescribed today were discussed, and the patient expressed understanding of the given instructions. Patient is instructed to call or message via MyChart if he/she has any questions or concerns regarding our treatment plan. No barriers to understanding were identified. We discussed Red Flag symptoms and signs in detail. Patient expressed understanding regarding what to do in case of urgent or emergency type symptoms.   Medication list was reconciled, printed and provided to the patient in AVS. Patient instructions and summary information was reviewed with the patient as documented in the AVS. This note was prepared with assistance of Dragon voice recognition software. Occasional wrong-word or sound-a-like substitutions may have occurred due to the inherent limitations of voice recognition software  This visit occurred during the SARS-CoV-2 public health emergency.  Safety protocols  were in place, including screening questions prior to the visit, additional usage of staff PPE, and extensive cleaning of exam room while observing appropriate contact time as indicated for disinfecting solutions.

## 2020-08-21 NOTE — Addendum Note (Signed)
Addended byHildred Alamin on: 08/21/2020 09:00 AM   Modules accepted: Orders

## 2020-08-21 NOTE — Patient Instructions (Signed)
Please return in 6 months for hypertension follow up and recheck.  We will call you with your lab results.  Today you were given your pneumovax vaccination. This helps protect your from getting pneumonia.  I'm glad you are doing so well. Please try the ditropan; this medicine should help manage your bladder symptoms.   If you have any questions or concerns, please don't hesitate to send me a message via MyChart or call the office at [phone]. Thank you for visiting with [place] today! It's our pleasure caring for you.

## 2020-08-22 ENCOUNTER — Other Ambulatory Visit: Payer: Self-pay

## 2020-08-22 ENCOUNTER — Encounter (HOSPITAL_COMMUNITY): Payer: Self-pay

## 2020-08-22 ENCOUNTER — Emergency Department (HOSPITAL_COMMUNITY)
Admission: EM | Admit: 2020-08-22 | Discharge: 2020-08-22 | Disposition: A | Discharge status: Home / Self Care | Payer: Medicare Other | Attending: Emergency Medicine | Admitting: Emergency Medicine

## 2020-08-22 DIAGNOSIS — Z7982 Long term (current) use of aspirin: Secondary | ICD-10-CM | POA: Insufficient documentation

## 2020-08-22 DIAGNOSIS — Y9389 Activity, other specified: Secondary | ICD-10-CM | POA: Diagnosis not present

## 2020-08-22 DIAGNOSIS — S161XXA Strain of muscle, fascia and tendon at neck level, initial encounter: Secondary | ICD-10-CM | POA: Insufficient documentation

## 2020-08-22 DIAGNOSIS — Z96651 Presence of right artificial knee joint: Secondary | ICD-10-CM | POA: Insufficient documentation

## 2020-08-22 DIAGNOSIS — Z79899 Other long term (current) drug therapy: Secondary | ICD-10-CM | POA: Diagnosis not present

## 2020-08-22 DIAGNOSIS — R52 Pain, unspecified: Secondary | ICD-10-CM | POA: Diagnosis not present

## 2020-08-22 DIAGNOSIS — Z96652 Presence of left artificial knee joint: Secondary | ICD-10-CM | POA: Diagnosis not present

## 2020-08-22 DIAGNOSIS — M542 Cervicalgia: Secondary | ICD-10-CM | POA: Diagnosis not present

## 2020-08-22 DIAGNOSIS — S199XXA Unspecified injury of neck, initial encounter: Secondary | ICD-10-CM | POA: Diagnosis present

## 2020-08-22 DIAGNOSIS — Y92410 Unspecified street and highway as the place of occurrence of the external cause: Secondary | ICD-10-CM | POA: Insufficient documentation

## 2020-08-22 DIAGNOSIS — I10 Essential (primary) hypertension: Secondary | ICD-10-CM | POA: Insufficient documentation

## 2020-08-22 HISTORY — DX: Disorder of kidney and ureter, unspecified: N28.9

## 2020-08-22 LAB — CBC WITH DIFFERENTIAL/PLATELET
Absolute Monocytes: 474 cells/uL (ref 200–950)
Basophils Absolute: 20 cells/uL (ref 0–200)
Basophils Relative: 0.4 %
Eosinophils Absolute: 61 cells/uL (ref 15–500)
Eosinophils Relative: 1.2 %
HCT: 34.9 % — ABNORMAL LOW (ref 35.0–45.0)
Hemoglobin: 11.6 g/dL — ABNORMAL LOW (ref 11.7–15.5)
Lymphs Abs: 1540 cells/uL (ref 850–3900)
MCH: 33.1 pg — ABNORMAL HIGH (ref 27.0–33.0)
MCHC: 33.2 g/dL (ref 32.0–36.0)
MCV: 99.7 fL (ref 80.0–100.0)
MPV: 10 fL (ref 7.5–12.5)
Monocytes Relative: 9.3 %
Neutro Abs: 3004 cells/uL (ref 1500–7800)
Neutrophils Relative %: 58.9 %
Platelets: 274 10*3/uL (ref 140–400)
RBC: 3.5 10*6/uL — ABNORMAL LOW (ref 3.80–5.10)
RDW: 12.8 % (ref 11.0–15.0)
Total Lymphocyte: 30.2 %
WBC: 5.1 10*3/uL (ref 3.8–10.8)

## 2020-08-22 LAB — HEPATITIS C ANTIBODY
Hepatitis C Ab: NONREACTIVE
SIGNAL TO CUT-OFF: 0.09 (ref <1.00)

## 2020-08-22 LAB — COMPLETE METABOLIC PANEL WITH GFR
AG Ratio: 1.1 (calc) (ref 1.0–2.5)
ALT: 13 U/L (ref 6–29)
AST: 22 U/L (ref 10–35)
Albumin: 4.1 g/dL (ref 3.6–5.1)
Alkaline phosphatase (APISO): 66 U/L (ref 37–153)
BUN/Creatinine Ratio: 24 (calc) — ABNORMAL HIGH (ref 6–22)
BUN: 24 mg/dL (ref 7–25)
CO2: 33 mmol/L — ABNORMAL HIGH (ref 20–32)
Calcium: 9.8 mg/dL (ref 8.6–10.4)
Chloride: 101 mmol/L (ref 98–110)
Creat: 0.98 mg/dL — ABNORMAL HIGH (ref 0.60–0.93)
GFR, Est African American: 66 mL/min/{1.73_m2} (ref >=60)
GFR, Est Non African American: 57 mL/min/{1.73_m2} — ABNORMAL LOW (ref >=60)
Globulin: 3.8 g/dL (calc) — ABNORMAL HIGH (ref 1.9–3.7)
Glucose, Bld: 85 mg/dL (ref 65–99)
Potassium: 4 mmol/L (ref 3.5–5.3)
Sodium: 140 mmol/L (ref 135–146)
Total Bilirubin: 0.6 mg/dL (ref 0.2–1.2)
Total Protein: 7.9 g/dL (ref 6.1–8.1)

## 2020-08-22 LAB — LIPID PANEL
Cholesterol: 191 mg/dL (ref <200)
HDL: 66 mg/dL (ref >=50)
LDL Cholesterol (Calc): 99 mg/dL (calc)
Non-HDL Cholesterol (Calc): 125 mg/dL (calc) (ref <130)
Total CHOL/HDL Ratio: 2.9 (calc) (ref <5.0)
Triglycerides: 154 mg/dL — ABNORMAL HIGH (ref <150)

## 2020-08-22 LAB — VITAMIN D 25 HYDROXY (VIT D DEFICIENCY, FRACTURES): Vit D, 25-Hydroxy: 39 ng/mL (ref 30–100)

## 2020-08-22 MED ORDER — ACETAMINOPHEN 325 MG PO TABS
650.0000 mg | ORAL_TABLET | Freq: Once | ORAL | Status: AC
  Administered 2020-08-22: 650 mg via ORAL
  Filled 2020-08-22: qty 2
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
## 2020-08-22 NOTE — ED Notes (Addendum)
An After Visit Summary was printed and given to the patient. °Discharge instructions given and no further questions at this time.  °Pt states a friend is driving her home.  °
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
## 2020-08-22 NOTE — Progress Notes (Signed)
Please call patient: I have reviewed his/her lab results. All lab results look ok. No changes are needed at this time.

## 2020-08-22 NOTE — Discharge Instructions (Signed)
You have been seen and discharged from the emergency department.  Follow-up with your primary provider for reevaluation. Take Tylenol as prescribed for pain control.  Use ice and heat on the neck muscles for relief.  You may experience increased muscle soreness/tightness over the next 2 days. If you have any worsening symptoms or further concerns or health please return to emergency department for further evaluation.

## 2020-08-23 ENCOUNTER — Telehealth: Payer: Self-pay
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
## 2020-08-23 NOTE — Telephone Encounter (Signed)
Patient is scheduled for Monday at 9:30

## 2020-08-23 NOTE — Telephone Encounter (Signed)
Needs evaluation to better assess needs.  Please call, reassure, deep breathing exercises and schedule with me early next week.

## 2020-08-23 NOTE — Telephone Encounter (Signed)
Please advise 

## 2020-08-26 ENCOUNTER — Ambulatory Visit (INDEPENDENT_AMBULATORY_CARE_PROVIDER_SITE_OTHER): Payer: Medicare Other | Admitting: Family Medicine

## 2020-08-26 ENCOUNTER — Other Ambulatory Visit: Payer: Self-pay

## 2020-08-26 ENCOUNTER — Encounter: Payer: Self-pay | Admitting: Family Medicine

## 2020-08-26 DIAGNOSIS — G4709 Other insomnia: Secondary | ICD-10-CM

## 2020-08-26 DIAGNOSIS — F4322 Adjustment disorder with anxiety: Secondary | ICD-10-CM | POA: Diagnosis not present

## 2020-08-26 MED ORDER — TRAZODONE HCL 50 MG PO TABS: 25.0000 mg | ORAL_TABLET | Freq: Every evening | ORAL | 3 refills | Status: AC | PRN
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
## 2020-08-27 ENCOUNTER — Telehealth: Payer: Self-pay
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
## 2020-08-27 NOTE — Telephone Encounter (Signed)
Please advise 

## 2020-08-28 NOTE — Telephone Encounter (Signed)
FYI

## 2020-08-28 NOTE — Telephone Encounter (Signed)
Pt called back saying she got in at [facility] for PT.

## 2020-08-30 ENCOUNTER — Telehealth: Payer: Self-pay

## 2020-08-30 DIAGNOSIS — M542 Cervicalgia: Secondary | ICD-10-CM | POA: Diagnosis not present

## 2020-08-30 NOTE — Telephone Encounter (Signed)
OK for DME script? Please advise
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
## 2020-09-02 ENCOUNTER — Other Ambulatory Visit: Payer: Self-pay | Admitting: Family Medicine

## 2020-09-02 ENCOUNTER — Telehealth: Payer: Self-pay

## 2020-09-02 DIAGNOSIS — Z1231 Encounter for screening mammogram for malignant neoplasm of breast: Secondary | ICD-10-CM

## 2020-09-02 NOTE — Telephone Encounter (Signed)
FYI

## 2020-09-02 NOTE — Telephone Encounter (Signed)
Pt called to set up a follow up, per Dr. Jonni Sanger. Pt was crying. States she has no one and that everyone is trying to use her. Pt stated she just wants to be alone. I asked pt if she had everything she needed, if she was eating and sleeping. Pt said Yes maam. Reassured pt to call up to our office if she needs anything. Pt is scheduled for Jan 7

## 2020-09-03 NOTE — Telephone Encounter (Signed)
Can you please order and fax this [place] for me?

## 2020-09-03 NOTE — Telephone Encounter (Signed)
Yes, if still needed

## 2020-09-04 DIAGNOSIS — M542 Cervicalgia: Secondary | ICD-10-CM | POA: Diagnosis not present

## 2020-09-04 NOTE — Telephone Encounter (Signed)
Order has been filled out and fax to [facility]. Confirmation fax has been received.

## 2020-09-16 DIAGNOSIS — M542 Cervicalgia: Secondary | ICD-10-CM | POA: Diagnosis not present

## 2020-09-19 DIAGNOSIS — M542 Cervicalgia: Secondary | ICD-10-CM | POA: Diagnosis not present

## 2020-09-20 ENCOUNTER — Encounter: Payer: Self-pay | Admitting: Family Medicine

## 2020-09-20 ENCOUNTER — Other Ambulatory Visit: Payer: Self-pay

## 2020-09-20 ENCOUNTER — Telehealth (INDEPENDENT_AMBULATORY_CARE_PROVIDER_SITE_OTHER): Payer: Medicare Other | Admitting: Family Medicine

## 2020-09-20 DIAGNOSIS — R1030 Lower abdominal pain, unspecified: Secondary | ICD-10-CM | POA: Diagnosis not present

## 2020-09-20 DIAGNOSIS — F4322 Adjustment disorder with anxiety: Secondary | ICD-10-CM

## 2020-09-20 DIAGNOSIS — K5790 Diverticulosis of intestine, part unspecified, without perforation or abscess without bleeding: Secondary | ICD-10-CM | POA: Diagnosis not present

## 2020-09-20 DIAGNOSIS — G4709 Other insomnia: Secondary | ICD-10-CM | POA: Diagnosis not present

## 2020-09-20 DIAGNOSIS — N3281 Overactive bladder: Secondary | ICD-10-CM | POA: Diagnosis not present

## 2020-09-20 MED ORDER — METRONIDAZOLE 500 MG PO TABS: 500.0000 mg | ORAL_TABLET | Freq: Two times a day (BID) | ORAL | 0 refills | Status: AC

## 2020-09-20 MED ORDER — CIPROFLOXACIN HCL 500 MG PO TABS: 500.0000 mg | ORAL_TABLET | Freq: Two times a day (BID) | ORAL | 0 refills | Status: DC
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
## 2020-09-23 ENCOUNTER — Ambulatory Visit: Payer: Medicare Other | Admitting: Family Medicine

## 2020-09-23 ENCOUNTER — Telehealth: Payer: Self-pay

## 2020-09-23 DIAGNOSIS — M542 Cervicalgia: Secondary | ICD-10-CM | POA: Diagnosis not present

## 2020-09-23 NOTE — Telephone Encounter (Signed)
Called to inform patient that PCP is requesting she come in today for her 2 appt because she is needing lab work. Patient became very upset, stating that she was told during her visit on Friday if the antibiotic was working, she did not need to come in. States she does not have a ride for an appt today, offered several other time slots this week, states she does not know which day will work. Started crying and said she wished everyone would just leave her alone and ended call. Attempted to call back, no answer.

## 2020-09-26 ENCOUNTER — Other Ambulatory Visit: Payer: Self-pay

## 2020-09-26 DIAGNOSIS — M542 Cervicalgia: Secondary | ICD-10-CM | POA: Diagnosis not present

## 2020-09-26 DIAGNOSIS — M222X1 Patellofemoral disorders, right knee: Secondary | ICD-10-CM | POA: Diagnosis not present

## 2020-09-27 ENCOUNTER — Other Ambulatory Visit: Payer: Self-pay

## 2020-09-27 ENCOUNTER — Telehealth: Payer: Self-pay

## 2020-09-27 ENCOUNTER — Encounter: Payer: Self-pay | Admitting: Family Medicine

## 2020-09-27 ENCOUNTER — Other Ambulatory Visit: Payer: Self-pay | Admitting: *Deleted

## 2020-09-27 ENCOUNTER — Ambulatory Visit (INDEPENDENT_AMBULATORY_CARE_PROVIDER_SITE_OTHER): Payer: Medicare Other | Admitting: Family Medicine

## 2020-09-27 VITALS — BP 118/78 | HR 56 | Temp 97.5°F | Wt 187.6 lb

## 2020-09-27 DIAGNOSIS — F4322 Adjustment disorder with anxiety: Secondary | ICD-10-CM | POA: Diagnosis not present

## 2020-09-27 DIAGNOSIS — K5792 Diverticulitis of intestine, part unspecified, without perforation or abscess without bleeding: Secondary | ICD-10-CM | POA: Diagnosis not present

## 2020-09-27 DIAGNOSIS — F329 Major depressive disorder, single episode, unspecified: Secondary | ICD-10-CM | POA: Diagnosis not present

## 2020-09-27 DIAGNOSIS — Z748 Other problems related to care provider dependency: Secondary | ICD-10-CM

## 2020-09-27 DIAGNOSIS — F419 Anxiety disorder, unspecified: Secondary | ICD-10-CM

## 2020-09-27 LAB — COMPREHENSIVE METABOLIC PANEL
ALT: 13 U/L (ref 0–35)
AST: 18 U/L (ref 0–37)
Albumin: 4.1 g/dL (ref 3.5–5.2)
Alkaline Phosphatase: 56 U/L (ref 39–117)
BUN: 17 mg/dL (ref 6–23)
CO2: 30 mEq/L (ref 19–32)
Calcium: 9.4 mg/dL (ref 8.4–10.5)
Chloride: 101 mEq/L (ref 96–112)
Creatinine, Ser: 1.04 mg/dL (ref 0.40–1.20)
GFR: 53.2 mL/min — ABNORMAL LOW (ref >60.00)
Glucose, Bld: 101 mg/dL — ABNORMAL HIGH (ref 70–99)
Potassium: 3.2 mEq/L — ABNORMAL LOW (ref 3.5–5.1)
Sodium: 139 mEq/L (ref 135–145)
Total Bilirubin: 0.7 mg/dL (ref 0.2–1.2)
Total Protein: 7.6 g/dL (ref 6.0–8.3)

## 2020-09-27 LAB — CBC WITH DIFFERENTIAL/PLATELET
Basophils Absolute: 0 10*3/uL (ref 0.0–0.1)
Basophils Relative: 0.5 % (ref 0.0–3.0)
Eosinophils Absolute: 0 10*3/uL (ref 0.0–0.7)
Eosinophils Relative: 0.6 % (ref 0.0–5.0)
HCT: 34.1 % — ABNORMAL LOW (ref 36.0–46.0)
Hemoglobin: 11.4 g/dL — ABNORMAL LOW (ref 12.0–15.0)
Lymphocytes Relative: 26.4 % (ref 12.0–46.0)
Lymphs Abs: 1.3 10*3/uL (ref 0.7–4.0)
MCHC: 33.3 g/dL (ref 30.0–36.0)
MCV: 100.1 fl — ABNORMAL HIGH (ref 78.0–100.0)
Monocytes Absolute: 0.6 10*3/uL (ref 0.1–1.0)
Monocytes Relative: 11.6 % (ref 3.0–12.0)
Neutro Abs: 3 10*3/uL (ref 1.4–7.7)
Neutrophils Relative %: 60.9 % (ref 43.0–77.0)
Platelets: 205 10*3/uL (ref 150.0–400.0)
RBC: 3.4 Mil/uL — ABNORMAL LOW (ref 3.87–5.11)
RDW: 14.2 % (ref 11.5–15.5)
WBC: 5 10*3/uL (ref 4.0–10.5)

## 2020-09-27 LAB — SEDIMENTATION RATE: Sed Rate: 72 mm/hr — ABNORMAL HIGH (ref 0–30)

## 2020-09-27 MED ORDER — BUSPIRONE HCL 7.5 MG PO TABS: 7.5000 mg | ORAL_TABLET | Freq: Two times a day (BID) | ORAL | 2 refills | Status: DC | PRN
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
## 2020-09-27 NOTE — Telephone Encounter (Signed)
Error
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
## 2020-10-01 NOTE — Progress Notes (Signed)
Please call patient: I have reviewed his/her lab results. Lab work looks stable.  Please ensure her stomach pain has resolved.  Her potassium is low: due to HCTZ. Please add kdur 61meq daily; to take with HCTZ as needed. May take twice on the first day, then daily with hctz. Thanks.

## 2020-10-02 ENCOUNTER — Other Ambulatory Visit: Payer: Self-pay

## 2020-10-02 DIAGNOSIS — H04123 Dry eye syndrome of bilateral lacrimal glands: Secondary | ICD-10-CM | POA: Diagnosis not present

## 2020-10-02 DIAGNOSIS — H401122 Primary open-angle glaucoma, left eye, moderate stage: Secondary | ICD-10-CM | POA: Diagnosis not present

## 2020-10-02 DIAGNOSIS — H401113 Primary open-angle glaucoma, right eye, severe stage: Secondary | ICD-10-CM | POA: Diagnosis not present

## 2020-10-02 DIAGNOSIS — Z961 Presence of intraocular lens: Secondary | ICD-10-CM | POA: Diagnosis not present

## 2020-10-02 MED ORDER — POTASSIUM CHLORIDE CRYS ER 20 MEQ PO TBCR: 20.0000 meq | EXTENDED_RELEASE_TABLET | Freq: Every day | ORAL | 3 refills | Status: DC

## 2020-10-08 DIAGNOSIS — M79642 Pain in left hand: Secondary | ICD-10-CM | POA: Diagnosis not present

## 2020-10-09 DIAGNOSIS — M542 Cervicalgia: Secondary | ICD-10-CM | POA: Diagnosis not present

## 2020-10-11 ENCOUNTER — Ambulatory Visit: Payer: Medicare Other

## 2020-10-17 DIAGNOSIS — M542 Cervicalgia: Secondary | ICD-10-CM | POA: Diagnosis not present

## 2020-10-21 ENCOUNTER — Ambulatory Visit: Payer: Medicare Other | Admitting: Family Medicine

## 2020-10-21 DIAGNOSIS — M542 Cervicalgia: Secondary | ICD-10-CM | POA: Diagnosis not present

## 2020-10-28 DIAGNOSIS — M542 Cervicalgia: Secondary | ICD-10-CM | POA: Diagnosis not present

## 2020-10-30 ENCOUNTER — Ambulatory Visit: Payer: Medicare Other | Admitting: Psychology

## 2020-11-04 DIAGNOSIS — M542 Cervicalgia: Secondary | ICD-10-CM | POA: Diagnosis not present

## 2020-11-05 ENCOUNTER — Ambulatory Visit: Payer: Medicare Other

## 2020-11-08 ENCOUNTER — Ambulatory Visit: Payer: Medicare Other | Admitting: Family Medicine

## 2020-11-19 ENCOUNTER — Telehealth: Payer: Self-pay

## 2020-11-19 NOTE — Telephone Encounter (Signed)
Patient called in wondering if Dr.Andy got her message from yesterday, did advise I did not see any messages. Brittany Stark states she apologizes for her actions in the past but wanted to update Dr.Andy that she had been taking over the counter magnesium which she caused some severe chest pain, not currently taking it or having any symptoms.

## 2020-11-20 NOTE — Telephone Encounter (Signed)
FYI

## 2020-11-20 NOTE — Telephone Encounter (Signed)
Patient states that her chest pains stopped once she stopped taking OTC magnesium. Adamantly declined appt at this time. States if chest pains return she will go to ER or call our office.

## 2020-11-20 NOTE — Telephone Encounter (Signed)
Please call to clarify/triage. If having chest pain, needs office visit.

## 2020-12-02 ENCOUNTER — Telehealth: Payer: Self-pay

## 2020-12-02 NOTE — Telephone Encounter (Signed)
Pt needs to give [place] the name of the medication she thinks is causing her problems.  She is on hctz but this is to be used as needed.   If she is having that much frequency, likely needs OV.

## 2020-12-02 NOTE — Telephone Encounter (Signed)
Patient states she will call back when she is home from work.

## 2020-12-02 NOTE — Telephone Encounter (Signed)
Pt returned Katie's call. Please advise.

## 2020-12-02 NOTE — Telephone Encounter (Signed)
Patient called and spoke to after hours line. States she was given prescription for her bladder. LMOVM to return my call for more information.

## 2020-12-02 NOTE — Telephone Encounter (Signed)
Patient states that she is at work and cannot tell me the name of the medication, but it was recently prescribed by Dr. Jonni Sanger. It is causing increased urination at night - getting up 6-8 times nightly. I do not see any medications on patient's current med list that would be causing this problem. Please advise

## 2020-12-03 NOTE — Telephone Encounter (Signed)
Patient scheduled for tomorrow

## 2020-12-03 NOTE — Telephone Encounter (Signed)
Please advise. I do not see when this was prescribed

## 2020-12-03 NOTE — Telephone Encounter (Signed)
This should not be causing her symptoms. She may stop it.  I can see her to evaluate or refer to urology

## 2020-12-03 NOTE — Telephone Encounter (Signed)
Patient called back and said the medication was Oxybutynin 10mg .

## 2020-12-04 ENCOUNTER — Other Ambulatory Visit: Payer: Self-pay

## 2020-12-04 ENCOUNTER — Ambulatory Visit (INDEPENDENT_AMBULATORY_CARE_PROVIDER_SITE_OTHER): Payer: Medicare Other | Admitting: Family Medicine

## 2020-12-04 ENCOUNTER — Ambulatory Visit
Admission: RE | Admit: 2020-12-04 | Discharge: 2020-12-04 | Disposition: A | Discharge status: Home / Self Care | Payer: Medicare Other | Source: Ambulatory Visit | Attending: Family Medicine | Admitting: Family Medicine

## 2020-12-04 ENCOUNTER — Encounter: Payer: Self-pay | Admitting: Family Medicine

## 2020-12-04 VITALS — BP 112/72 | HR 92 | Temp 97.9°F | Wt 184.8 lb

## 2020-12-04 DIAGNOSIS — N3281 Overactive bladder: Secondary | ICD-10-CM

## 2020-12-04 DIAGNOSIS — R350 Frequency of micturition: Secondary | ICD-10-CM | POA: Diagnosis not present

## 2020-12-04 DIAGNOSIS — Z1231 Encounter for screening mammogram for malignant neoplasm of breast: Secondary | ICD-10-CM | POA: Diagnosis not present

## 2020-12-04 DIAGNOSIS — R600 Localized edema: Secondary | ICD-10-CM

## 2020-12-04 DIAGNOSIS — R3589 Other polyuria: Secondary | ICD-10-CM | POA: Diagnosis not present

## 2020-12-04 DIAGNOSIS — F4322 Adjustment disorder with anxiety: Secondary | ICD-10-CM

## 2020-12-04 DIAGNOSIS — E876 Hypokalemia: Secondary | ICD-10-CM | POA: Diagnosis not present

## 2020-12-04 MED ORDER — POTASSIUM CHLORIDE CRYS ER 20 MEQ PO TBCR: 20.0000 meq | EXTENDED_RELEASE_TABLET | Freq: Every day | ORAL | 3 refills | Status: DC

## 2020-12-04 NOTE — Progress Notes (Signed)
Subjective  CC:  Chief Complaint  Patient presents with  . Nocturia    Believes to be related to Oxybutynin 10 mg     HPI: Brittany Stark is a 75 y.o. female who presents to the office today to address the problems listed above in the chief complaint.  2 week history of nocturia: reports getting up 6-7 times with urgency and incontinence w/o dysuria, odor or gross hematuria. Reports had good control of bladder during the day. Not drinking fluids at night. No caffeine. On diptropan XL 10 nightly x 3 months; thought it might be helping her urgency sxs until 2 weeks ago. Has seen Dr. Jeffie Pollock for same. No f/c/s or abdominal pain.   Adjustment anxiety:  Persistent. But slightly better; on buspar. Back to work. Apparently Education officer, museum and therapy consults did not help. She tells a story of how they wanted her to someone other than lisa flores and she declined.   Low potassium on last labs; can't take the large kdur pills. Taking hctz daily but not sure she needs it. Gets overwhelmed I ask her about leg edema.   Assessment  1. Frequency of urination and polyuria   2. Overactive bladder   3. Hypokalemia   4. Bilateral lower extremity edema   5. Transient adjustment reaction with anxiety      Plan   nocturia:  OAB: increase ditropan xl to 20mg  nightly. May be limited by side effects. R/o infection and glycosuria with urine tests. Back to urology if can't get improved.  Recheck potassium on daily hctz. Hold for now given nocturia.   Monitor for recurrent leg edema  Anxiety: still a problem. Pt defers further intervention or counseling or help with SW at this time.   Follow up: as scheduled.  02/27/2021  Orders Placed This Encounter  Procedures  . Urine Culture  . Urinalysis, Routine w reflex microscopic  . Basic metabolic panel   Meds ordered this encounter  Medications  . potassium chloride SA (KLOR-CON) 20 MEQ tablet    Sig: Take 1 tablet (20 mEq total) by mouth daily.     Dispense:  91 tablet    Refill:  3      I reviewed the patients updated PMH, FH, and SocHx.    Patient Active Problem List   Diagnosis Date Noted  . Gait abnormality 05/04/2019    Priority: High  . Essential hypertension 08/02/2018    Priority: High  . Mixed hyperlipidemia 08/02/2018    Priority: High  . Rheumatoid arthritis ([place]) 08/02/2018    Priority: High  . Osteoarthritis, multiple sites 08/02/2018    Priority: High  . Osteoporosis 08/02/2018    Priority: High  . Peripheral neuropathy 01/30/2020    Priority: Medium  . Chronic constipation 01/30/2020    Priority: Medium  . Right foot drop 06/28/2019    Priority: Medium  . Anemia of chronic disease 02/24/2019    Priority: Medium  . Bilateral lower extremity edema 01/12/2019    Priority: Medium  . Degeneration of lumbar intervertebral disc 08/23/2018    Priority: Medium  . Fatty liver 08/02/2018    Priority: Medium  . Overactive bladder 08/02/2018    Priority: Medium  . History of kidney stones     Priority: Medium  . History of total knee replacement, right 12/01/2017    Priority: Medium  . Osteoarthritis of left knee 11/02/2016    Priority: Medium  . Hydronephrosis of right kidney 05/01/2014    Priority: Medium  .  Vitamin D deficiency 08/21/2020    Priority: Low  . Diverticulosis 08/02/2018    Priority: Low   Current Meds  Medication Sig  . acetaminophen (TYLENOL) 500 MG tablet Take 500 mg by mouth every 6 (six) hours as needed. 2 in am  . ASPIRIN 81 PO Take 1 tablet by mouth daily.  Marland Kitchen BIOTIN PO Take 1 capsule by mouth daily.  . busPIRone (BUSPAR) 7.5 MG tablet Take 1 tablet (7.5 mg total) by mouth 2 (two) times daily as needed.  . COMBIGAN 0.2-0.5 % ophthalmic solution INT 1 GTT IN OU BID  . hydrochlorothiazide (HYDRODIURIL) 25 MG tablet TAKE 1 TABLET(25 MG) BY MOUTH LEGS DAILY AS NEEDED FOR SWELLING  . omeprazole (PRILOSEC) 20 MG capsule TAKE 1 CAPSULE(20 MG) BY MOUTH DAILY  . rosuvastatin (CRESTOR) 10  MG tablet TAKE 1 TABLET(10 MG) BY MOUTH DAILY  . Travoprost, BAK Free, (TRAVATAN) 0.004 % SOLN ophthalmic solution Place 1 drop into both eyes at bedtime.  . traZODone (DESYREL) 50 MG tablet Take 0.5-1 tablets (25-50 mg total) by mouth at bedtime as needed for sleep.  Marland Kitchen Upadacitinib (RINVOQ PO) Rinvoq  . [DISCONTINUED] oxybutynin (DITROPAN-XL) 10 MG 24 hr tablet Take 10 mg by mouth at bedtime.  . [DISCONTINUED] potassium chloride SA (KLOR-CON) 20 MEQ tablet Take 1 tablet (20 mEq total) by mouth daily. Take 2 tablets by mouth on first day, then once daily (Patient taking differently: Take 2 tablets by mouth on first day, then once daily)    Allergies: Patient is allergic to amoxicillin, arava [leflunomide], doxycycline, and methotrexate derivatives. Family History: Patient family history includes Breast cancer in her maternal aunt; Cancer in her mother; Heart disease in her sister; Stroke in her father. Social History:  Patient  reports that she has never smoked. She has never used smokeless tobacco. She reports that she does not drink alcohol and does not use drugs.  Review of Systems: Constitutional: Negative for fever malaise or anorexia Cardiovascular: negative for chest pain Respiratory: negative for SOB or persistent cough Gastrointestinal: negative for abdominal pain  Objective  Vitals: BP 112/72   Pulse 92   Temp 97.9 F (36.6 C) (Temporal)   Wt 184 lb 12.8 oz (83.8 kg)   SpO2 98%   BMI 27.29 kg/m  General: anxious, well groomed, A&Ox3 HEENT: PEERL, conjunctiva normal, neck is supple Cardiovascular:  RRR without murmur or gallop.  Respiratory:  Good breath sounds bilaterally, CTAB with normal respiratory effort, nocvat Abd: soft, no suprapubic ttp Ext: no edema. Skin:  Warm, no rashes  Lab Results  Component Value Date   CREATININE 1.04 09/27/2020   BUN 17 09/27/2020   NA 139 09/27/2020   K 3.2 (L) 09/27/2020   CL 101 09/27/2020   CO2 30 09/27/2020        Commons side effects, risks, benefits, and alternatives for medications and treatment plan prescribed today were discussed, and the patient expressed understanding of the given instructions. Patient is instructed to call or message via MyChart if he/she has any questions or concerns regarding our treatment plan. No barriers to understanding were identified. We discussed Red Flag symptoms and signs in detail. Patient expressed understanding regarding what to do in case of urgent or emergency type symptoms.   Medication list was reconciled, printed and provided to the patient in AVS. Patient instructions and summary information was reviewed with the patient as documented in the AVS. This note was prepared with assistance of Dragon voice recognition software. Occasional wrong-word or sound-a-like  substitutions may have occurred due to the inherent limitations of voice recognition software  This visit occurred during the SARS-CoV-2 public health emergency.  Safety protocols were in place, including screening questions prior to the visit, additional usage of staff PPE, and extensive cleaning of exam room while observing appropriate contact time as indicated for disinfecting solutions.

## 2020-12-04 NOTE — Patient Instructions (Signed)
Please follow up if symptoms do not improve or as needed.   I will call you after I receive your urine test results.   You may increase your oxybutinin to 2 pills (20mg ) nightly to see if this helps your symptoms.  Please stop the HCTZ for now to see if this helps. Let me know if your legs swell again.   IF your bladder symptoms persist, you will need to see Dr. Jeffie Pollock again.

## 2020-12-05 DIAGNOSIS — M47812 Spondylosis without myelopathy or radiculopathy, cervical region: Secondary | ICD-10-CM | POA: Diagnosis not present

## 2020-12-05 LAB — URINE CULTURE
MICRO NUMBER:: 11682850
SPECIMEN QUALITY:: ADEQUATE

## 2020-12-05 LAB — URINALYSIS, ROUTINE W REFLEX MICROSCOPIC
Bilirubin Urine: NEGATIVE
Ketones, ur: NEGATIVE
Leukocytes,Ua: NEGATIVE
Nitrite: NEGATIVE
Specific Gravity, Urine: 1.015 (ref 1.000–1.030)
Total Protein, Urine: NEGATIVE
Urine Glucose: NEGATIVE
Urobilinogen, UA: 0.2 (ref 0.0–1.0)
pH: 6.5 (ref 5.0–8.0)

## 2020-12-05 LAB — BASIC METABOLIC PANEL
BUN: 27 mg/dL — ABNORMAL HIGH (ref 6–23)
CO2: 29 mEq/L (ref 19–32)
Calcium: 9.5 mg/dL (ref 8.4–10.5)
Chloride: 101 mEq/L (ref 96–112)
Creatinine, Ser: 1.12 mg/dL (ref 0.40–1.20)
GFR: 48.6 mL/min — ABNORMAL LOW (ref >60.00)
Glucose, Bld: 103 mg/dL — ABNORMAL HIGH (ref 70–99)
Potassium: 3.8 mEq/L (ref 3.5–5.1)
Sodium: 139 mEq/L (ref 135–145)

## 2020-12-05 NOTE — Progress Notes (Signed)
Please call patient: I have reviewed his/her lab results. Urine and labs look ok now.  Try increasing dose of oxybutinin as we discussed.  Hold hctz Need to see urology again if persist. Pt can schedule appt or we can help if needed.  Saw dr. Jeffie Pollock in the recent past.

## 2020-12-18 DIAGNOSIS — M5412 Radiculopathy, cervical region: Secondary | ICD-10-CM | POA: Diagnosis not present

## 2020-12-25 ENCOUNTER — Telehealth: Payer: Self-pay | Admitting: Family Medicine

## 2020-12-25 NOTE — Telephone Encounter (Signed)
Left message for patient to call back and schedule Medicare Annual Wellness Visit (AWV) either virtually OR in office.   No hx; please schedule at anytime with [facility]Nurse Health Advisor at [facility].  This should be a 45 minute visit.   

## 2021-01-08 DIAGNOSIS — Z79899 Other long term (current) drug therapy: Secondary | ICD-10-CM | POA: Diagnosis not present

## 2021-01-08 DIAGNOSIS — M79642 Pain in left hand: Secondary | ICD-10-CM | POA: Diagnosis not present

## 2021-01-08 DIAGNOSIS — M150 Primary generalized (osteo)arthritis: Secondary | ICD-10-CM | POA: Diagnosis not present

## 2021-01-08 DIAGNOSIS — M0579 Rheumatoid arthritis with rheumatoid factor of multiple sites without organ or systems involvement: Secondary | ICD-10-CM | POA: Diagnosis not present

## 2021-01-08 DIAGNOSIS — M2550 Pain in unspecified joint: Secondary | ICD-10-CM | POA: Diagnosis not present

## 2021-01-15 DIAGNOSIS — M069 Rheumatoid arthritis, unspecified: Secondary | ICD-10-CM | POA: Diagnosis not present

## 2021-01-15 DIAGNOSIS — H04123 Dry eye syndrome of bilateral lacrimal glands: Secondary | ICD-10-CM | POA: Diagnosis not present

## 2021-01-15 DIAGNOSIS — H401113 Primary open-angle glaucoma, right eye, severe stage: Secondary | ICD-10-CM | POA: Diagnosis not present

## 2021-01-15 DIAGNOSIS — H401122 Primary open-angle glaucoma, left eye, moderate stage: Secondary | ICD-10-CM | POA: Diagnosis not present

## 2021-01-15 DIAGNOSIS — Z961 Presence of intraocular lens: Secondary | ICD-10-CM | POA: Diagnosis not present

## 2021-01-28 ENCOUNTER — Telehealth: Payer: Self-pay

## 2021-01-28 NOTE — Telephone Encounter (Signed)
Pt called stating she has a yeast infection. Pt has been using monistat but she has irritated her skin and is asking for medical assistant to give her a call with advice. Please advise.

## 2021-01-28 NOTE — Telephone Encounter (Signed)
Please call pt and schedule an appt with Dr. Jonni Sanger or another provider. Needs to be seen to be evaluated for infection.

## 2021-01-29 ENCOUNTER — Other Ambulatory Visit: Payer: Self-pay | Admitting: Family Medicine

## 2021-01-29 NOTE — Telephone Encounter (Signed)
Called patient to schedule appt. Patient stated she was doing better and didn't need appt.

## 2021-02-05 DIAGNOSIS — R300 Dysuria: Secondary | ICD-10-CM | POA: Diagnosis not present

## 2021-02-05 DIAGNOSIS — R309 Painful micturition, unspecified: Secondary | ICD-10-CM | POA: Diagnosis not present

## 2021-02-09 DIAGNOSIS — B379 Candidiasis, unspecified: Secondary | ICD-10-CM | POA: Diagnosis not present

## 2021-02-09 DIAGNOSIS — Z9181 History of falling: Secondary | ICD-10-CM | POA: Diagnosis not present

## 2021-02-13 DIAGNOSIS — N362 Urethral caruncle: Secondary | ICD-10-CM | POA: Diagnosis not present

## 2021-02-13 DIAGNOSIS — R300 Dysuria: Secondary | ICD-10-CM | POA: Diagnosis not present

## 2021-02-20 ENCOUNTER — Telehealth: Payer: Self-pay

## 2021-02-20 NOTE — Telephone Encounter (Signed)
Patient is calling in wondering if she can be prescribed Gabapentin again, states she feels like her feet issues arent getting any better.

## 2021-02-21 NOTE — Telephone Encounter (Signed)
Patient was not super happy, but gave a verbal understanding on waiting until next week to discuss medication restart.

## 2021-02-26 ENCOUNTER — Ambulatory Visit: Payer: Medicare Other | Admitting: Family Medicine

## 2021-02-27 ENCOUNTER — Ambulatory Visit: Payer: Medicare Other

## 2021-02-27 ENCOUNTER — Ambulatory Visit: Payer: Medicare Other | Admitting: Family Medicine

## 2021-03-03 ENCOUNTER — Ambulatory Visit (INDEPENDENT_AMBULATORY_CARE_PROVIDER_SITE_OTHER): Payer: Medicare Other

## 2021-03-03 DIAGNOSIS — Z0000 Encounter for general adult medical examination without abnormal findings: Secondary | ICD-10-CM | POA: Diagnosis not present

## 2021-03-03 NOTE — Patient Instructions (Signed)
Ms. Galindez , Thank you for taking time to come for your Medicare Wellness Visit. I appreciate your ongoing commitment to your health goals. Please review the following plan we discussed and let me know if I can assist you in the future.   Screening recommendations/referrals: Colonoscopy: Done 09/30/17 repeat in 10 years  Mammogram: Done 12/04/20 repeat every year  Bone Density: Done 02/06/20 repeat in 2 years  Recommended yearly ophthalmology/optometry visit for glaucoma screening and checkup Recommended yearly dental visit for hygiene and checkup  Vaccinations: Influenza vaccine: Due 04/14/21 Pneumococcal vaccine: completed  Tdap vaccine: Due and discussed  Shingles vaccine: Completed 6/13 & 05/28/18   Covid-19:Completed 2/13, 3/13, & 07/17/20  Advanced directives: Please bring a copy of your health care power of attorney and living will to the office at your convenience.  Conditions/risks identified: none at this time   Next appointment: Follow up in one year for your annual wellness visit    Preventive Care 65 Years and Older, Female Preventive care refers to lifestyle choices and visits with your health care provider that can promote health and wellness. What does preventive care include? A yearly physical exam. This is also called an annual well check. Dental exams once or twice a year. Routine eye exams. Ask your health care provider how often you should have your eyes checked. Personal lifestyle choices, including: Daily care of your teeth and gums. Regular physical activity. Eating a healthy diet. Avoiding tobacco and drug use. Limiting alcohol use. Practicing safe sex. Taking low-dose aspirin every day. Taking vitamin and mineral supplements as recommended by your health care provider. What happens during an annual well check? The services and screenings done by your health care provider during your annual well check will depend on your age, overall health, lifestyle risk  factors, and family history of disease. Counseling  Your health care provider may ask you questions about your: Alcohol use. Tobacco use. Drug use. Emotional well-being. Home and relationship well-being. Sexual activity. Eating habits. History of falls. Memory and ability to understand (cognition). Work and work Statistician. Reproductive health. Screening  You may have the following tests or measurements: Height, weight, and BMI. Blood pressure. Lipid and cholesterol levels. These may be checked every 5 years, or more frequently if you are over 35 years old. Skin check. Lung cancer screening. You may have this screening every year starting at age 34 if you have a 30-pack-year history of smoking and currently smoke or have quit within the past 15 years. Fecal occult blood test (FOBT) of the stool. You may have this test every year starting at age 63. Flexible sigmoidoscopy or colonoscopy. You may have a sigmoidoscopy every 5 years or a colonoscopy every 10 years starting at age 55. Hepatitis C blood test. Hepatitis B blood test. Sexually transmitted disease (STD) testing. Diabetes screening. This is done by checking your blood sugar (glucose) after you have not eaten for a while (fasting). You may have this done every 1-3 years. Bone density scan. This is done to screen for osteoporosis. You may have this done starting at age 89. Mammogram. This may be done every 1-2 years. Talk to your health care provider about how often you should have regular mammograms. Talk with your health care provider about your test results, treatment options, and if necessary, the need for more tests. Vaccines  Your health care provider may recommend certain vaccines, such as: Influenza vaccine. This is recommended every year. Tetanus, diphtheria, and acellular pertussis (Tdap, Td) vaccine. You  may need a Td booster every 10 years. Zoster vaccine. You may need this after age 75. Pneumococcal 13-valent  conjugate (PCV13) vaccine. One dose is recommended after age 79. Pneumococcal polysaccharide (PPSV23) vaccine. One dose is recommended after age 49. Talk to your health care provider about which screenings and vaccines you need and how often you need them. This information is not intended to replace advice given to you by your health care provider. Make sure you discuss any questions you have with your health care provider. Document Released: 09/27/2015 Document Revised: 05/20/2016 Document Reviewed: 07/02/2015 Elsevier Interactive Patient Education  2017 [place] Prevention in the Home Falls can cause injuries. They can happen to people of all ages. There are many things you can do to make your home safe and to help prevent falls. What can I do on the outside of my home? Regularly fix the edges of walkways and driveways and fix any cracks. Remove anything that might make you trip as you walk through a door, such as a raised step or threshold. Trim any bushes or trees on the path to your home. Use bright outdoor lighting. Clear any walking paths of anything that might make someone trip, such as rocks or tools. Regularly check to see if handrails are loose or broken. Make sure that both sides of any steps have handrails. Any raised decks and porches should have guardrails on the edges. Have any leaves, snow, or ice cleared regularly. Use sand or salt on walking paths during winter. Clean up any spills in your garage right away. This includes oil or grease spills. What can I do in the bathroom? Use night lights. Install grab bars by the toilet and in the tub and shower. Do not use towel bars as grab bars. Use non-skid mats or decals in the tub or shower. If you need to sit down in the shower, use a plastic, non-slip stool. Keep the floor dry. Clean up any water that spills on the floor as soon as it happens. Remove soap buildup in the tub or shower regularly. Attach bath mats  securely with double-sided non-slip rug tape. Do not have throw rugs and other things on the floor that can make you trip. What can I do in the bedroom? Use night lights. Make sure that you have a light by your bed that is easy to reach. Do not use any sheets or blankets that are too big for your bed. They should not hang down onto the floor. Have a firm chair that has side arms. You can use this for support while you get dressed. Do not have throw rugs and other things on the floor that can make you trip. What can I do in the kitchen? Clean up any spills right away. Avoid walking on wet floors. Keep items that you use a lot in easy-to-reach places. If you need to reach something above you, use a strong step stool that has a grab bar. Keep electrical cords out of the way. Do not use floor polish or wax that makes floors slippery. If you must use wax, use non-skid floor wax. Do not have throw rugs and other things on the floor that can make you trip. What can I do with my stairs? Do not leave any items on the stairs. Make sure that there are handrails on both sides of the stairs and use them. Fix handrails that are broken or loose. Make sure that handrails are as long as the  stairways. Check any carpeting to make sure that it is firmly attached to the stairs. Fix any carpet that is loose or worn. Avoid having throw rugs at the top or bottom of the stairs. If you do have throw rugs, attach them to the floor with carpet tape. Make sure that you have a light switch at the top of the stairs and the bottom of the stairs. If you do not have them, ask someone to add them for you. What else can I do to help prevent falls? Wear shoes that: Do not have high heels. Have rubber bottoms. Are comfortable and fit you well. Are closed at the toe. Do not wear sandals. If you use a stepladder: Make sure that it is fully opened. Do not climb a closed stepladder. Make sure that both sides of the stepladder  are locked into place. Ask someone to hold it for you, if possible. Clearly mark and make sure that you can see: Any grab bars or handrails. First and last steps. Where the edge of each step is. Use tools that help you move around (mobility aids) if they are needed. These include: Canes. Walkers. Scooters. Crutches. Turn on the lights when you go into a dark area. Replace any light bulbs as soon as they burn out. Set up your furniture so you have a clear path. Avoid moving your furniture around. If any of your floors are uneven, fix them. If there are any pets around you, be aware of where they are. Review your medicines with your doctor. Some medicines can make you feel dizzy. This can increase your chance of falling. Ask your doctor what other things that you can do to help prevent falls. This information is not intended to replace advice given to you by your health care provider. Make sure you discuss any questions you have with your health care provider. Document Released: 06/27/2009 Document Revised: 02/06/2016 Document Reviewed: 10/05/2014 Elsevier Interactive Patient Education  2017 [employer].

## 2021-03-03 NOTE — Progress Notes (Signed)
Virtual Visit via Telephone Note  I connected with  Brittany Stark on 03/03/21 at  3:15 PM EDT by telephone and verified that I am speaking with the correct person using two identifiers.  Medicare Annual Wellness visit completed telephonically due to Covid-19 pandemic.   Persons participating in this call: This Health Coach and this patient.   Location: Patient: Home Provider: Office   I discussed the limitations, risks, security and privacy concerns of performing an evaluation and management service by telephone and the availability of in person appointments. The patient expressed understanding and agreed to proceed.  Unable to perform video visit due to video visit attempted and failed and/or patient does not have video capability.   Some vital signs may be absent or patient reported.   Willette Brace, LPN   Subjective:   Brittany Stark is a 75 y.o. female who presents for an Initial Medicare Annual Wellness Visit.  Review of Systems     Cardiac Risk Factors include: advanced age (>65men, >77 women);dyslipidemia;hypertension     Objective:    There were no vitals filed for this visit. There is no height or weight on file to calculate BMI.  Advanced Directives 03/03/2021 08/22/2020 06/01/2019 06/13/2018 04/11/2018 04/05/2018 12/22/2016  Does Patient Have a Medical Advance Directive? Yes No No No No No No  Type of Advance Directive [place]  Does patient want to make changes to medical advance directive? - No - Patient declined - - - - -  Copy of [place] in Chart? No - copy requested - - - - - -  Would patient like information on creating a medical advance directive? - No - Patient declined - - No - Patient declined No - Patient declined -    Current Medications (verified) Outpatient Encounter Medications as of 03/03/2021  Medication Sig   acetaminophen (TYLENOL) 500 MG tablet Take 500 mg by mouth every 6 (six) hours as  needed. 2 in am   ASPIRIN 81 PO Take 1 tablet by mouth daily.   BIOTIN PO Take 1 capsule by mouth daily.   busPIRone (BUSPAR) 7.5 MG tablet Take 1 tablet (7.5 mg total) by mouth 2 (two) times daily as needed.   COMBIGAN 0.2-0.5 % ophthalmic solution INT 1 GTT IN OU BID   estradiol (ESTRACE) 0.1 MG/GM vaginal cream Place vaginally.   hydrochlorothiazide (HYDRODIURIL) 25 MG tablet TAKE 1 TABLET(25 MG) BY MOUTH LEGS DAILY AS NEEDED FOR SWELLING   potassium chloride SA (KLOR-CON) 20 MEQ tablet Take 1 tablet (20 mEq total) by mouth daily.   rosuvastatin (CRESTOR) 10 MG tablet TAKE 1 TABLET(10 MG) BY MOUTH DAILY   Travoprost, BAK Free, (TRAVATAN) 0.004 % SOLN ophthalmic solution Place 1 drop into both eyes at bedtime.   traZODone (DESYREL) 50 MG tablet Take 0.5-1 tablets (25-50 mg total) by mouth at bedtime as needed for sleep.   Upadacitinib (RINVOQ PO) Rinvoq   omeprazole (PRILOSEC) 20 MG capsule TAKE 1 CAPSULE(20 MG) BY MOUTH DAILY (Patient not taking: Reported on 03/03/2021)   oxybutynin (DITROPAN-XL) 10 MG 24 hr tablet Take 2 tablets (20 mg total) by mouth at bedtime. (Patient not taking: Reported on 03/03/2021)   No facility-administered encounter medications on file as of 03/03/2021.    Allergies (verified) Alendronate, Amoxicillin, Arava [leflunomide], Doxycycline, and Methotrexate derivatives   History: Past Medical History:  Diagnosis Date   Anemia of chronic disease 02/24/2019   Nl iron, vit  b12 and folate 01/2019   Arthritis    rheumatoid and osteoarthritis    Depression    Elevated cholesterol    currently under control   GERD (gastroesophageal reflux disease)    laryngopharyngeal reflux   Hemorrhoids    History of kidney stones    hx of   Hypertension    Neuropathy    Patellar tendon rupture, right, subsequent encounter 12/23/2016   Peripheral neuropathy 01/30/2020   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 Evaluated by Dr. Krista Blue, neuro 2020   Renal  disorder    Past Surgical History:  Procedure Laterality Date   ABDOMINAL HYSTERECTOMY     BREAST SURGERY     breast reduction    CATARACT EXTRACTION     both eyes   COLONOSCOPY     hx polyps   CYSTOSCOPY W/ RETROGRADES Right 05/01/2014   Procedure: CYSTOSCOPY WITH RIGHT RETROGRADE PYELOGRAM;  Surgeon: Malka So, MD;  Location: [facility];  Service: Urology;  Laterality: Right;   CYSTOSCOPY WITH BIOPSY  05/01/2014   Procedure: CYSTOSCOPY WITH bladder BIOPSY;  Surgeon: Malka So, MD;  Location: [facility];  Service: Urology;;   EYE SURGERY     KNEE ARTHROSCOPY  1995   ORIF PATELLA Right 12/23/2016   Procedure: RIGHT PATELLA TENDON REPAIR;  Surgeon: Gaynelle Arabian, MD;  Location: [facility];  Service: Orthopedics;  Laterality: Right;   right ankle surgery      has pins    right arm surgery      pins in right arm    TOTAL KNEE ARTHROPLASTY Right 11/02/2016   Procedure: RIGHT TOTAL KNEE ARTHROPLASTY;  Surgeon: Gaynelle Arabian, MD;  Location: [facility];  Service: Orthopedics;  Laterality: Right;  with abductor block   TOTAL KNEE ARTHROPLASTY Left 04/11/2018   Procedure: LEFT TOTAL KNEE ARTHROPLASTY;  Surgeon: Gaynelle Arabian, MD;  Location: [facility];  Service: Orthopedics;  Laterality: Left;  block   Family History  Problem Relation Age of Onset   Breast cancer Maternal Aunt    Heart disease Sister    Cancer Mother        unknown origin   Stroke Father    Colon cancer Neg Hx    Esophageal cancer Neg Hx    Rectal cancer Neg Hx    Stomach cancer Neg Hx    Social History   Socioeconomic History   Marital status: Divorced    Spouse name: Not on file   Number of children: 2   Years of education: 12   Highest education level: High school graduate  Occupational History   Occupation: Retired  Tobacco Use   Smoking status: Never   Smokeless tobacco: Never  Scientific laboratory technician Use: Never used  Substance and Sexual Activity   Alcohol use: No    Alcohol/week: 0.0 standard drinks   Drug use: No    Sexual activity: Not Currently  Other Topics Concern   Not on file  Social History Narrative   Lives at home alone.   Right-handed.   Occasional caffeine use.   Social Determinants of Health   Financial Resource Strain: Low Risk    Difficulty of Paying Living Expenses: Not hard at all  Food Insecurity: No Food Insecurity   Worried About Charity fundraiser in the Last Year: Never true   [place] in the Last Year: Never true  Transportation Needs: No Transportation Needs   Lack of Transportation (Medical): No  Lack of Transportation (Non-Medical): No  Physical Activity: Inactive   Days of Exercise per Week: 0 days   Minutes of Exercise per Session: 0 min  Stress: Stress Concern Present   Feeling of Stress : To some extent  Social Connections: Moderately Isolated   Frequency of Communication with Friends and Family: Once a week   Frequency of Social Gatherings with Friends and Family: More than three times a week   Attends Religious Services: More than 4 times per year   Active Member of [employer] or Organizations: No   Attends Music therapist: Never   Marital Status: Divorced    Tobacco Counseling Counseling given: Not Answered   Clinical Intake:  Pre-visit preparation completed: Yes  Pain : No/denies pain     BMI - recorded: 27.29 Nutritional Status: BMI 25 -29 Overweight Nutritional Risks: None Diabetes: No  How often do you need to have someone help you when you read instructions, pamphlets, or other written materials from your doctor or pharmacy?: 1 - Never  Diabetic?No  Interpreter Needed?: No  Information entered by :: Charlott Rakes, LPN   Activities of Daily Living In your present state of health, do you have any difficulty performing the following activities: 03/03/2021 08/21/2020  Hearing? N N  Vision? N N  Difficulty concentrating or making decisions? N N  Walking or climbing stairs? N N  Dressing or bathing? N N  Doing  errands, shopping? N N  Preparing Food and eating ? N -  Using the Toilet? N -  Managing your Medications? N -  Managing your Finances? N -  Housekeeping or managing your Housekeeping? N -  Some recent data might be hidden    Patient Care Team: Leamon Arnt, MD as PCP - General (Family Medicine) Gaynelle Arabian, MD as Consulting Physician (Orthopedic Surgery) Irene Shipper, MD as Consulting Physician (Gastroenterology) Irine Seal, MD as Attending Physician (Urology)  Indicate any recent Medical Services you may have received from other than [facility] providers in the past year (date may be approximate).     Assessment:   This is a routine wellness examination for [place].  Hearing/Vision screen Hearing Screening - Comments:: Pt denies any hearing issue  Vision Screening - Comments:: Pt follows up with Dr Katy Fitch for annual eye exams   Dietary issues and exercise activities discussed: Current Exercise Habits: The patient has a physically strenuous job, but has no regular exercise apart from work.   Goals Addressed             This Visit's Progress    Patient Stated       None at this time         Depression Screen PHQ 2/9 Scores 03/03/2021 09/27/2020 08/21/2020 04/13/2019 02/23/2019 08/02/2018  PHQ - 2 Score 2 4 0 0 0 0  PHQ- 9 Score 4 12 - - - -    Fall Risk Fall Risk  03/03/2021 08/21/2020 01/30/2020 04/13/2019 02/23/2019  Falls in the past year? 0 0 1 0 0  Number falls in past yr: 0 0 0 0 0  Injury with Fall? 0 0 0 0 0  Risk for fall due to : Impaired vision;Impaired balance/gait - Impaired balance/gait;Impaired mobility - -  Follow up Falls prevention discussed - - Falls evaluation completed Falls evaluation completed    FALL RISK PREVENTION PERTAINING TO THE HOME:  Any stairs in or around the home? No  If so, are there any without handrails? No  Home free  of loose throw rugs in walkways, pet beds, electrical cords, etc? Yes  Adequate lighting in your home to reduce  risk of falls? Yes   ASSISTIVE DEVICES UTILIZED TO PREVENT FALLS:  Life alert? No  Use of a cane, walker or w/c? Yes  Grab bars in the bathroom? Yes  Shower chair or bench in shower? Yes  Elevated toilet seat or a handicapped toilet? Yes   TIMED UP AND GO:  Was the test performed? No .      Cognitive Function:        Immunizations Immunization History  Administered Date(s) Administered   Fluad Quad(high Dose 65+) 06/08/2019   Influenza, Quadrivalent, Recombinant, Inj, Pf 06/23/2018   Influenza,inj,quad, With Preservative 07/15/2017   Influenza-Unspecified 06/14/2020   Moderna Sars-Covid-2 Vaccination 10/28/2019, 11/25/2019, 07/17/2020   Pneumococcal Conjugate-13 09/03/2014   Pneumococcal Polysaccharide-23 08/21/2020   Zoster Recombinat (Shingrix) 02/24/2018, 05/28/2018    TDAP status: Due, Education has been provided regarding the importance of this vaccine. Advised may receive this vaccine at local pharmacy or Health Dept. Aware to provide a copy of the vaccination record if obtained from local pharmacy or Health Dept. Verbalized acceptance and understanding.  Flu Vaccine status: Up to date  Pneumococcal vaccine status: Up to date  Covid-19 vaccine status: Completed vaccines  Qualifies for Shingles Vaccine? Yes   Zostavax completed Yes   Shingrix Completed?: Yes  Screening Tests Health Maintenance  Topic Date Due   TETANUS/TDAP  Never done   COVID-19 Vaccine (4 - Booster for Moderna series) 10/17/2020   INFLUENZA VACCINE  04/14/2021   MAMMOGRAM  12/04/2021   DEXA SCAN  02/05/2022   COLONOSCOPY (Pts 45-66yrs Insurance coverage will need to be confirmed)  10/01/2027   Hepatitis C Screening  Completed   PNA vac Low Risk Adult  Completed   Zoster Vaccines- Shingrix  Completed   HPV VACCINES  Aged Out    Health Maintenance  Health Maintenance Due  Topic Date Due   TETANUS/TDAP  Never done   COVID-19 Vaccine (4 - Booster for Moderna series) 10/17/2020     Colorectal cancer screening: Type of screening: Colonoscopy. Completed 09/30/17. Repeat every 10 years  Mammogram status: Completed 12/04/20. Repeat every year  Bone Density status: Completed 02/06/20. Results reflect: Bone density results: OSTEOPOROSIS. Repeat every 2 years.   Additional Screening:  Hepatitis C Screening:  Completed 08/21/20  Vision Screening: Recommended annual ophthalmology exams for early detection of glaucoma and other disorders of the eye. Is the patient up to date with their annual eye exam?  Yes  Who is the provider or what is the name of the office in which the patient attends annual eye exams? Dr Katy Fitch  If pt is not established with a provider, would they like to be referred to a provider to establish care? No .   Dental Screening: Recommended annual dental exams for proper oral hygiene  Community Resource Referral / Chronic Care Management: CRR required this visit?  No   CCM required this visit?  No      Plan:     I have personally reviewed and noted the following in the patient's chart:   Medical and social history Use of alcohol, tobacco or illicit drugs  Current medications and supplements including opioid prescriptions. Patient is not currently taking opioid prescriptions. Functional ability and status Nutritional status Physical activity Advanced directives List of other physicians Hospitalizations, surgeries, and ER visits in previous 12 months Vitals Screenings to include cognitive, depression, and falls Referrals  and appointments  In addition, I have reviewed and discussed with patient certain preventive protocols, quality metrics, and best practice recommendations. A written personalized care plan for preventive services as well as general preventive health recommendations were provided to patient.     Willette Brace, LPN   09/22/3233    Nurse Notes: none

## 2021-03-14 ENCOUNTER — Telehealth: Payer: Self-pay

## 2021-03-14 NOTE — Chronic Care Management (AMB) (Signed)
  Chronic Care Management   Note  03/14/2021 Name: Brittany Stark MRN: [id number] DOB: 1945-10-12  Brittany Stark is a 75 y.o. year old female who is a primary care patient of Leamon Arnt, MD. I reached out to [name] by phone today in response to a referral sent by Brittany Stark's PCP, Leamon Arnt, MD      Brittany Stark was given information about Chronic Care Management services today including:  CCM service includes personalized support from designated clinical staff supervised by her physician, including individualized plan of care and coordination with other care providers 24/7 contact phone numbers for assistance for urgent and routine care needs. Service will only be billed when office clinical staff spend 20 minutes or more in a month to coordinate care. Only one practitioner may furnish and bill the service in a calendar month. The patient may stop CCM services at any time (effective at the end of the month) by phone call to the office staff. The patient will be responsible for cost sharing (co-pay) of up to 20% of the service fee (after annual deductible is met).  Patient agreed to services and verbal consent obtained.   Follow up plan: Telephone appointment with care management team member scheduled for:03/25/2021  Noreene Larsson, [address], [place], [place] [address] Direct Dial: [phone] [place] [email or web address] Website: [email or web address]

## 2021-03-25 ENCOUNTER — Telehealth: Payer: Medicare Other

## 2021-04-16 ENCOUNTER — Encounter: Payer: Self-pay | Admitting: Family Medicine

## 2021-04-16 ENCOUNTER — Other Ambulatory Visit: Payer: Self-pay

## 2021-04-16 ENCOUNTER — Ambulatory Visit (INDEPENDENT_AMBULATORY_CARE_PROVIDER_SITE_OTHER): Payer: Medicare Other | Admitting: Family Medicine

## 2021-04-16 VITALS — BP 135/82 | HR 55 | Temp 97.0°F | Ht 69.0 in | Wt 174.4 lb

## 2021-04-16 DIAGNOSIS — I10 Essential (primary) hypertension: Secondary | ICD-10-CM

## 2021-04-16 DIAGNOSIS — M059 Rheumatoid arthritis with rheumatoid factor, unspecified: Secondary | ICD-10-CM | POA: Diagnosis not present

## 2021-04-16 DIAGNOSIS — F4322 Adjustment disorder with anxiety: Secondary | ICD-10-CM | POA: Diagnosis not present

## 2021-04-16 DIAGNOSIS — N3281 Overactive bladder: Secondary | ICD-10-CM | POA: Diagnosis not present

## 2021-04-16 DIAGNOSIS — G629 Polyneuropathy, unspecified: Secondary | ICD-10-CM

## 2021-04-16 LAB — BASIC METABOLIC PANEL
BUN: 18 mg/dL (ref 6–23)
CO2: 30 mEq/L (ref 19–32)
Calcium: 9.6 mg/dL (ref 8.4–10.5)
Chloride: 102 mEq/L (ref 96–112)
Creatinine, Ser: 0.88 mg/dL (ref 0.40–1.20)
GFR: 64.75 mL/min (ref >60.00)
Glucose, Bld: 85 mg/dL (ref 70–99)
Potassium: 3.3 mEq/L — ABNORMAL LOW (ref 3.5–5.1)
Sodium: 142 mEq/L (ref 135–145)

## 2021-04-16 MED ORDER — ESCITALOPRAM OXALATE 10 MG PO TABS: 10.0000 mg | ORAL_TABLET | Freq: Every day | ORAL | 2 refills | Status: DC

## 2021-04-16 MED ORDER — GABAPENTIN 300 MG PO CAPS: 300.0000 mg | ORAL_CAPSULE | Freq: Every day | ORAL | 11 refills | Status: AC

## 2021-04-16 MED ORDER — HYDROCHLOROTHIAZIDE 25 MG PO TABS: 25.0000 mg | ORAL_TABLET | Freq: Every day | ORAL | 1 refills | Status: DC | PRN

## 2021-04-16 MED ORDER — HYDROCHLOROTHIAZIDE 25 MG PO TABS: 25.0000 mg | ORAL_TABLET | Freq: Every day | ORAL | 1 refills | Status: DC

## 2021-04-16 NOTE — Progress Notes (Signed)
Subjective  CC:  Chief Complaint  Patient presents with   Hypertension   Anxiety   Peripheral Neuropathy    HPI: Brittany Stark is a 75 y.o. female who presents to the office today to address the problems listed above in the chief complaint. Hypertension f/u: Control is good . Pt reports she is doing well. taking medications as instructed, no medication side effects noted, no TIAs, no chest pain on exertion, no dyspnea on exertion, no swelling of ankles.  She reports she is taking hydrochlorothiazide with potassium daily.  No lower extremity edema.  She denies adverse effects from his BP medications. Compliance with medication is good.  Anxiety, adjustment: Overall doing better.  Cannot tolerate BuSpar because she says it makes her sleepy.  She is back to work, and feels this is good for her.  She continues to admit to depressive symptoms.  See PHQ.  Reports anxiety is better.  Has been difficult, stressful year.  Difficult emotionally and financially. Depression screen [facility] 2/9 04/16/2021 03/03/2021 09/27/2020 08/21/2020 04/13/2019  Decreased Interest '2 1 2 '$ 0 0  Down, Depressed, Hopeless '3 1 2 '$ 0 0  PHQ - 2 Score '5 2 4 '$ 0 0  Altered sleeping 2 0 0 - -  Tired, decreased energy '3 1 2 '$ - -  Change in appetite '1 1 2 '$ - -  Feeling bad or failure about yourself  3 0 2 - -  Trouble concentrating 2 0 2 - -  Moving slowly or fidgety/restless 0 0 0 - -  Suicidal thoughts 0 0 0 - -  PHQ-9 Score '16 4 12 '$ - -  Difficult doing work/chores Somewhat difficult Somewhat difficult - - -   Overactive bladder symptoms: Still with polyuria at night.  She does have a urologist.  Had recent burning symptoms and was treated with vaginal estrogen.  This has improved.  Taking oxybutynin.  Helpful during the day but still problematic at night. Peripheral neuropathy: Requesting to restart gabapentin.  She been on this in the past.  Now feels that it was helpful.  She has nighttime burning and cold sensation to her feet  that is bothersome.  No foot sores. Rheumatoid arthritis: Gait abnormality.  Managed by rheumatology.  No current inflammation or active symptoms.  Assessment  1. Essential hypertension   2. Rheumatoid arthritis with positive rheumatoid factor, involving unspecified site ([place])   3. Adjustment disorder with anxiety   4. Overactive bladder   5. Peripheral polyneuropathy      Plan   Hypertension f/u: BP control is well controlled.  On HCTZ with potassium.  Recheck BMP today Depression with anxiety, adjustment f/u: Continues to be a problem.  Education counseling done.  Start Lexapro.  Education and expectations given.  Recheck 3 months.  Sooner if problems.  Stop BuSpar Overactive bladder: Continue oxybutynin but she has an upcoming appointment with urology.  To discuss other management strategies then. Peripheral neuropathy: Active and bothersome.  Restart gabapentin, 300 mg at night.  Can titrate dose and frequency up if tolerates and if needed. Rheumatoid arthritis: Her rheumatologist.  Stable  Education regarding management of these chronic disease states was given. Management strategies discussed on successive visits include dietary and exercise recommendations, goals of achieving and maintaining IBW, and lifestyle modifications aiming for adequate sleep and minimizing stressors.   Follow up: 3 months for complete physical and recheck mood  Orders Placed This Encounter  Procedures   Basic metabolic panel    Meds ordered  this encounter  Medications   DISCONTD: hydrochlorothiazide (HYDRODIURIL) 25 MG tablet    Sig: Take 1 tablet (25 mg total) by mouth daily as needed (leg swelling).    Dispense:  90 tablet    Refill:  1   gabapentin (NEURONTIN) 300 MG capsule    Sig: Take 1 capsule (300 mg total) by mouth at bedtime.    Dispense:  90 capsule    Refill:  11   escitalopram (LEXAPRO) 10 MG tablet    Sig: Take 1 tablet (10 mg total) by mouth daily.    Dispense:  30 tablet     Refill:  2   hydrochlorothiazide (HYDRODIURIL) 25 MG tablet    Sig: Take 1 tablet (25 mg total) by mouth daily.    Dispense:  90 tablet    Refill:  1      BP Readings from Last 3 Encounters:  04/16/21 135/82  12/04/20 112/72  09/27/20 118/78   Wt Readings from Last 3 Encounters:  04/16/21 174 lb 6.1 oz (79.1 kg)  12/04/20 184 lb 12.8 oz (83.8 kg)  09/27/20 187 lb 9.6 oz (85.1 kg)    Lab Results  Component Value Date   CHOL 191 08/21/2020   CHOL 174 01/30/2020   CHOL 238 (H) 01/12/2019   Lab Results  Component Value Date   HDL 66 08/21/2020   HDL 55.00 01/30/2020   HDL 63.70 01/12/2019   Lab Results  Component Value Date   LDLCALC 99 08/21/2020   LDLCALC 81 01/30/2020   LDLCALC 144 (H) 01/12/2019   Lab Results  Component Value Date   TRIG 154 (H) 08/21/2020   TRIG 189.0 (H) 01/30/2020   TRIG 153.0 (H) 01/12/2019   Lab Results  Component Value Date   CHOLHDL 2.9 08/21/2020   CHOLHDL 3 01/30/2020   CHOLHDL 4 01/12/2019   No results found for: LDLDIRECT Lab Results  Component Value Date   CREATININE 1.12 12/04/2020   BUN 27 (H) 12/04/2020   NA 139 12/04/2020   K 3.8 12/04/2020   CL 101 12/04/2020   CO2 29 12/04/2020    The 10-year ASCVD risk score Mikey Bussing DC Jr., et al., 2013) is: 16.1%   Values used to calculate the score:     Age: 68 years     Sex: Female     Is Non-Hispanic African American: Yes     Diabetic: No     Tobacco smoker: No     Systolic Blood Pressure: [id number] mmHg     Is BP treated: Yes     HDL Cholesterol: 66 mg/dL     Total Cholesterol: 191 mg/dL  I reviewed the patients updated PMH, FH, and SocHx.    Patient Active Problem List   Diagnosis Date Noted   Gait abnormality 05/04/2019    Priority: High   Essential hypertension 08/02/2018    Priority: High   Mixed hyperlipidemia 08/02/2018    Priority: High   Rheumatoid arthritis ([place]) 08/02/2018    Priority: High   Osteoarthritis, multiple sites 08/02/2018    Priority: High    Osteoporosis 08/02/2018    Priority: High   Peripheral neuropathy 01/30/2020    Priority: Medium   Chronic constipation 01/30/2020    Priority: Medium   Right foot drop 06/28/2019    Priority: Medium   Anemia of chronic disease 02/24/2019    Priority: Medium   Bilateral lower extremity edema 01/12/2019    Priority: Medium   Degeneration of lumbar intervertebral disc 08/23/2018  Priority: Medium   Fatty liver 08/02/2018    Priority: Medium   Overactive bladder 08/02/2018    Priority: Medium   History of kidney stones     Priority: Medium   History of total knee replacement, right 12/01/2017    Priority: Medium   Osteoarthritis of left knee 11/02/2016    Priority: Medium   Hydronephrosis of right kidney 05/01/2014    Priority: Medium   Vitamin D deficiency 08/21/2020    Priority: Low   Diverticulosis 08/02/2018    Priority: Low    Allergies: Alendronate, Amoxicillin, Arava [leflunomide], Doxycycline, and Methotrexate derivatives  Social History: Patient  reports that she has never smoked. She has never used smokeless tobacco. She reports that she does not drink alcohol and does not use drugs.  Current Meds  Medication Sig   acetaminophen (TYLENOL) 500 MG tablet Take 500 mg by mouth every 6 (six) hours as needed. 2 in am   ASPIRIN 81 PO Take 1 tablet by mouth daily.   BIOTIN PO Take 1 capsule by mouth daily.   COMBIGAN 0.2-0.5 % ophthalmic solution INT 1 GTT IN OU BID   escitalopram (LEXAPRO) 10 MG tablet Take 1 tablet (10 mg total) by mouth daily.   estradiol (ESTRACE) 0.1 MG/GM vaginal cream Place vaginally.   omeprazole (PRILOSEC) 20 MG capsule TAKE 1 CAPSULE(20 MG) BY MOUTH DAILY   oxybutynin (DITROPAN-XL) 10 MG 24 hr tablet Take 20 mg by mouth at bedtime.   potassium chloride SA (KLOR-CON) 20 MEQ tablet Take 1 tablet (20 mEq total) by mouth daily.   rosuvastatin (CRESTOR) 10 MG tablet TAKE 1 TABLET(10 MG) BY MOUTH DAILY   Travoprost, BAK Free, (TRAVATAN) 0.004 %  SOLN ophthalmic solution Place 1 drop into both eyes at bedtime.   traZODone (DESYREL) 50 MG tablet Take 0.5-1 tablets (25-50 mg total) by mouth at bedtime as needed for sleep.   Upadacitinib (RINVOQ PO) Rinvoq   [DISCONTINUED] busPIRone (BUSPAR) 7.5 MG tablet Take 1 tablet (7.5 mg total) by mouth 2 (two) times daily as needed.   [DISCONTINUED] hydrochlorothiazide (HYDRODIURIL) 25 MG tablet TAKE 1 TABLET(25 MG) BY MOUTH LEGS DAILY AS NEEDED FOR SWELLING    Review of Systems: Cardiovascular: negative for chest pain, palpitations, leg swelling, orthopnea Respiratory: negative for SOB, wheezing or persistent cough Gastrointestinal: negative for abdominal pain Genitourinary: negative for dysuria or gross hematuria  Objective  Vitals: BP 135/82   Pulse (!) 55   Temp (!) 97 F (36.1 C)   Ht '5\' 9"'$  (1.753 m)   Wt 174 lb 6.1 oz (79.1 kg)   SpO2 100%   BMI 25.75 kg/m  General: no acute distress  Psych:  Alert and oriented, mildly flat mood and affect, fair insight, well-groomed HEENT:  Normocephalic, atraumatic, supple neck  Cardiovascular:  RRR without murmur. no edema Respiratory:  Good breath sounds bilaterally, CTAB with normal respiratory effort Skin:  Warm, no rashes Neurologic:   Mental status is normal Commons side effects, risks, benefits, and alternatives for medications and treatment plan prescribed today were discussed, and the patient expressed understanding of the given instructions. Patient is instructed to call or message via MyChart if he/she has any questions or concerns regarding our treatment plan. No barriers to understanding were identified. We discussed Red Flag symptoms and signs in detail. Patient expressed understanding regarding what to do in case of urgent or emergency type symptoms.  Medication list was reconciled, printed and provided to the patient in AVS. Patient instructions and summary information  was reviewed with the patient as documented in the AVS. This  note was prepared with assistance of Dragon voice recognition software. Occasional wrong-word or sound-a-like substitutions may have occurred due to the inherent limitations of voice recognition software  This visit occurred during the SARS-CoV-2 public health emergency.  Safety protocols were in place, including screening questions prior to the visit, additional usage of staff PPE, and extensive cleaning of exam room while observing appropriate contact time as indicated for disinfecting solutions.

## 2021-04-16 NOTE — Patient Instructions (Signed)
Please return in 3 months for complete physical and mood recheck. Sooner if you are having problems with the new medication.   I've ordered gabapentin for your neuropathy.  I've ordered lexapro for your mood. Start taking it once a day. It should help.  Glad you are working and doing better over all.   If you have any questions or concerns, please don't hesitate to send me a message via MyChart or call the office at [phone]. Thank you for visiting with [place] today! It's our pleasure caring for you.

## 2021-04-16 NOTE — Progress Notes (Signed)
Please call patient: I have reviewed his/her lab results. Please ask her to double check: is she taking potassium everyday?? Please take kdur 64mq twice a day for 3 days, then return to once a day. Continue HCTZ daily as well.

## 2021-05-05 ENCOUNTER — Telehealth: Payer: Self-pay

## 2021-05-05 NOTE — Telephone Encounter (Signed)
Nurse Assessment Nurse: Gildardo Pounds, RN, Amy Date/Time Brittany Stark Time): 05/05/2021 8:19:20 AM Confirm and document reason for call. If symptomatic, describe symptoms. ---Caller states she cannot stand up today. She has stood up, but it feels like she is going to fall. Her legs feel very weak today. She is not sure if it could be due to not drinking water yesterday. She noticed that she is sleepy today. That is unusual for her. She works a job. Does the patient have any new or worsening symptoms? ---Yes Will a triage be completed? ---Yes Related visit to physician within the last 2 weeks? ---No Does the PT have any chronic conditions? (i.e. diabetes, asthma, this includes High risk factors for pregnancy, etc.) ---Yes List chronic conditions. ---RA, neuropathy, OA Is this a behavioral health or substance abuse call? ---No PLEASE NOTE: All timestamps contained within this report are represented as [place] Standard Time. CONFIDENTIALTY NOTICE: This fax transmission is intended only for the addressee. It contains information that is legally privileged, confidential or otherwise protected from use or disclosure. If you are not the intended recipient, you are strictly prohibited from reviewing, disclosing, copying using or disseminating any of this information or taking any action in reliance on or regarding this information. If you have received this fax in error, please notify us immediately by telephone so that we can arrange for its return to [place]. Phone: [phone], Toll-Free: [phone], Fax: [phone] Page: 2 of 2 Call Id: [id number] Guidelines Guideline Title Affirmed Question Affirmed Notes Nurse Date/Time Brittany Stark Time) Weakness (Generalized) and Fatigue [1] MODERATE weakness (i.e., interferes with work, school, normal activities) AND [2] cause unknown (Exceptions: weakness with acute minor illness, or weakness from poor fluid intake) Lovelace, RN, Amy 05/05/2021  8:21:55 AM Disp. Time Brittany Stark Time) Disposition Final User 05/05/2021 8:31:25 AM See HCP within 4 Hours (or PCP triage) Yes Lovelace, RN, Amy Caller Disagree/Comply Comply Caller Understands Yes PreDisposition InappropriateToAsk Care Advice Given Per Guideline SEE HCP (OR PCP TRIAGE) WITHIN 4 HOURS: * IF OFFICE WILL BE OPEN: You need to be seen within the next 3 or 4 hours. Call your doctor (or NP/PA) now or as soon as the office opens. CARE ADVICE given per Weakness and Fatigue (Adult) guideline. Referrals GO TO FACILITY OTHER - SPECIFY

## 2021-05-06 NOTE — Telephone Encounter (Signed)
LVM to call back for an appointment.

## 2021-05-06 NOTE — Telephone Encounter (Signed)
Please call patient and offer appt within [place], does not have to be with Dr Jonni Sanger

## 2021-05-14 DIAGNOSIS — N3281 Overactive bladder: Secondary | ICD-10-CM | POA: Diagnosis not present

## 2021-05-14 DIAGNOSIS — R300 Dysuria: Secondary | ICD-10-CM | POA: Diagnosis not present

## 2021-05-14 DIAGNOSIS — R3121 Asymptomatic microscopic hematuria: Secondary | ICD-10-CM | POA: Diagnosis not present

## 2021-05-26 DIAGNOSIS — M79671 Pain in right foot: Secondary | ICD-10-CM | POA: Diagnosis not present

## 2021-05-26 DIAGNOSIS — M25561 Pain in right knee: Secondary | ICD-10-CM | POA: Diagnosis not present

## 2021-05-30 DIAGNOSIS — Z96651 Presence of right artificial knee joint: Secondary | ICD-10-CM | POA: Diagnosis not present

## 2021-06-05 ENCOUNTER — Telehealth: Payer: Medicare Other

## 2021-06-11 DIAGNOSIS — N3281 Overactive bladder: Secondary | ICD-10-CM | POA: Diagnosis not present

## 2021-06-11 DIAGNOSIS — R3121 Asymptomatic microscopic hematuria: Secondary | ICD-10-CM | POA: Diagnosis not present

## 2021-06-12 DIAGNOSIS — Z79899 Other long term (current) drug therapy: Secondary | ICD-10-CM | POA: Diagnosis not present

## 2021-06-12 DIAGNOSIS — R5383 Other fatigue: Secondary | ICD-10-CM | POA: Diagnosis not present

## 2021-06-12 DIAGNOSIS — M0579 Rheumatoid arthritis with rheumatoid factor of multiple sites without organ or systems involvement: Secondary | ICD-10-CM | POA: Diagnosis not present

## 2021-07-05 ENCOUNTER — Other Ambulatory Visit: Payer: Self-pay | Admitting: Family Medicine

## 2021-07-31 ENCOUNTER — Other Ambulatory Visit: Payer: Self-pay | Admitting: Family Medicine

## 2021-08-06 ENCOUNTER — Encounter: Payer: Medicare Other | Admitting: Family Medicine

## 2021-09-16 DIAGNOSIS — R3915 Urgency of urination: Secondary | ICD-10-CM | POA: Diagnosis not present

## 2021-09-25 DIAGNOSIS — R269 Unspecified abnormalities of gait and mobility: Secondary | ICD-10-CM | POA: Diagnosis not present

## 2021-09-26 ENCOUNTER — Encounter: Payer: Medicare Other | Admitting: Family Medicine

## 2021-10-01 DIAGNOSIS — M2550 Pain in unspecified joint: Secondary | ICD-10-CM | POA: Diagnosis not present

## 2021-10-01 DIAGNOSIS — M150 Primary generalized (osteo)arthritis: Secondary | ICD-10-CM | POA: Diagnosis not present

## 2021-10-01 DIAGNOSIS — M0579 Rheumatoid arthritis with rheumatoid factor of multiple sites without organ or systems involvement: Secondary | ICD-10-CM | POA: Diagnosis not present

## 2021-10-01 DIAGNOSIS — Z79899 Other long term (current) drug therapy: Secondary | ICD-10-CM | POA: Diagnosis not present

## 2021-11-05 ENCOUNTER — Telehealth: Payer: Self-pay | Admitting: Family Medicine

## 2021-11-05 NOTE — Chronic Care Management (AMB) (Signed)
°  Chronic Care Management   Outreach Note  11/05/2021 Name: Brittany Stark MRN: [id number] DOB: 1945/09/29  Referred by: Leamon Arnt, MD Reason for referral : No chief complaint on file.   An unsuccessful telephone outreach was attempted today. The patient was referred to the pharmacist for assistance with care management and care coordination.   Follow Up Plan:   Tatjana Dellinger Upstream Scheduler

## 2021-11-11 ENCOUNTER — Telehealth: Payer: Self-pay | Admitting: Family Medicine

## 2021-11-11 NOTE — Chronic Care Management (AMB) (Signed)
°  Chronic Care Management   Outreach Note  11/11/2021 Name: Brittany Stark MRN: [id number] DOB: 1946/04/22  Referred by: Leamon Arnt, MD Reason for referral : No chief complaint on file.   A second unsuccessful telephone outreach was attempted today. The patient was referred to pharmacist for assistance with care management and care coordination.  Follow Up Plan:   Tatjana Dellinger Upstream Scheduler

## 2021-11-17 ENCOUNTER — Telehealth: Payer: Self-pay | Admitting: Family Medicine

## 2021-11-17 NOTE — Chronic Care Management (AMB) (Signed)
?  Chronic Care Management  ? ?Outreach Note ? ?11/17/2021 ?Name: Brittany Stark MRN: [id number] DOB: 03/13/1946 ? ?Referred by: Leamon Arnt, MD ?Reason for referral : No chief complaint on file. ? ? ?Third unsuccessful telephone outreach was attempted today. The patient was referred to the pharmacist for assistance with care management and care coordination.  ? ?Follow Up Plan: pt states she does not want any more apts and hangs up ? ?Tatjana Dellinger ?Upstream Scheduler  ?

## 2021-11-19 DIAGNOSIS — H401122 Primary open-angle glaucoma, left eye, moderate stage: Secondary | ICD-10-CM | POA: Diagnosis not present

## 2021-11-19 DIAGNOSIS — M069 Rheumatoid arthritis, unspecified: Secondary | ICD-10-CM | POA: Diagnosis not present

## 2021-11-19 DIAGNOSIS — H401113 Primary open-angle glaucoma, right eye, severe stage: Secondary | ICD-10-CM | POA: Diagnosis not present

## 2021-11-19 DIAGNOSIS — M25561 Pain in right knee: Secondary | ICD-10-CM | POA: Diagnosis not present

## 2021-11-19 DIAGNOSIS — H43813 Vitreous degeneration, bilateral: Secondary | ICD-10-CM | POA: Diagnosis not present

## 2021-11-19 DIAGNOSIS — Z961 Presence of intraocular lens: Secondary | ICD-10-CM | POA: Diagnosis not present

## 2021-11-19 DIAGNOSIS — H04123 Dry eye syndrome of bilateral lacrimal glands: Secondary | ICD-10-CM | POA: Diagnosis not present

## 2021-12-02 ENCOUNTER — Other Ambulatory Visit (HOSPITAL_COMMUNITY): Payer: Self-pay | Admitting: Sports Medicine

## 2021-12-02 ENCOUNTER — Other Ambulatory Visit: Payer: Self-pay

## 2021-12-02 ENCOUNTER — Ambulatory Visit (HOSPITAL_COMMUNITY)
Admission: RE | Admit: 2021-12-02 | Discharge: 2021-12-02 | Disposition: A | Discharge status: Home / Self Care | Payer: Medicare Other | Source: Ambulatory Visit | Attending: Sports Medicine | Admitting: Sports Medicine

## 2021-12-02 DIAGNOSIS — M79604 Pain in right leg: Secondary | ICD-10-CM | POA: Diagnosis not present

## 2021-12-02 DIAGNOSIS — R600 Localized edema: Secondary | ICD-10-CM | POA: Diagnosis not present

## 2021-12-02 DIAGNOSIS — M7989 Other specified soft tissue disorders: Secondary | ICD-10-CM

## 2021-12-02 DIAGNOSIS — M25561 Pain in right knee: Secondary | ICD-10-CM | POA: Diagnosis not present

## 2021-12-02 NOTE — Progress Notes (Signed)
Lower extremity venous has been completed.  ? ?Preliminary results in CV Proc.  ? ?Brittany Stark ?12/02/2021 3:53 PM    ?

## 2021-12-08 ENCOUNTER — Other Ambulatory Visit: Payer: Self-pay | Admitting: Family Medicine

## 2021-12-10 DIAGNOSIS — R300 Dysuria: Secondary | ICD-10-CM | POA: Diagnosis not present

## 2021-12-10 DIAGNOSIS — R8271 Bacteriuria: Secondary | ICD-10-CM | POA: Diagnosis not present

## 2021-12-10 DIAGNOSIS — R3121 Asymptomatic microscopic hematuria: Secondary | ICD-10-CM | POA: Diagnosis not present

## 2021-12-17 ENCOUNTER — Encounter: Payer: Medicare Other | Admitting: Family Medicine

## 2021-12-19 DIAGNOSIS — M2351 Chronic instability of knee, right knee: Secondary | ICD-10-CM | POA: Diagnosis not present

## 2021-12-31 DIAGNOSIS — M25561 Pain in right knee: Secondary | ICD-10-CM | POA: Diagnosis not present

## 2021-12-31 DIAGNOSIS — M0579 Rheumatoid arthritis with rheumatoid factor of multiple sites without organ or systems involvement: Secondary | ICD-10-CM | POA: Diagnosis not present

## 2022-01-14 ENCOUNTER — Other Ambulatory Visit: Payer: Self-pay | Admitting: Family Medicine

## 2022-01-14 DIAGNOSIS — Z1231 Encounter for screening mammogram for malignant neoplasm of breast: Secondary | ICD-10-CM

## 2022-01-15 ENCOUNTER — Other Ambulatory Visit: Payer: Self-pay | Admitting: Family Medicine

## 2022-01-21 ENCOUNTER — Ambulatory Visit
Admission: RE | Admit: 2022-01-21 | Discharge: 2022-01-21 | Disposition: A | Discharge status: Home / Self Care | Payer: Medicare Other | Source: Ambulatory Visit | Attending: Family Medicine | Admitting: Family Medicine

## 2022-01-21 DIAGNOSIS — Z1231 Encounter for screening mammogram for malignant neoplasm of breast: Secondary | ICD-10-CM

## 2022-02-10 DIAGNOSIS — Z961 Presence of intraocular lens: Secondary | ICD-10-CM | POA: Diagnosis not present

## 2022-02-10 DIAGNOSIS — H401113 Primary open-angle glaucoma, right eye, severe stage: Secondary | ICD-10-CM | POA: Diagnosis not present

## 2022-02-10 DIAGNOSIS — H43813 Vitreous degeneration, bilateral: Secondary | ICD-10-CM | POA: Diagnosis not present

## 2022-02-10 DIAGNOSIS — H04123 Dry eye syndrome of bilateral lacrimal glands: Secondary | ICD-10-CM | POA: Diagnosis not present

## 2022-02-10 DIAGNOSIS — H401122 Primary open-angle glaucoma, left eye, moderate stage: Secondary | ICD-10-CM | POA: Diagnosis not present

## 2022-02-11 ENCOUNTER — Other Ambulatory Visit: Payer: Self-pay | Admitting: Family Medicine

## 2022-03-25 ENCOUNTER — Encounter: Payer: Medicare Other | Admitting: Family Medicine

## 2022-03-26 ENCOUNTER — Other Ambulatory Visit: Payer: Self-pay | Admitting: Family Medicine

## 2022-03-30 DIAGNOSIS — R8271 Bacteriuria: Secondary | ICD-10-CM | POA: Diagnosis not present

## 2022-03-30 DIAGNOSIS — R300 Dysuria: Secondary | ICD-10-CM | POA: Diagnosis not present

## 2022-03-30 DIAGNOSIS — R3121 Asymptomatic microscopic hematuria: Secondary | ICD-10-CM | POA: Diagnosis not present

## 2022-04-01 DIAGNOSIS — M1991 Primary osteoarthritis, unspecified site: Secondary | ICD-10-CM | POA: Diagnosis not present

## 2022-04-01 DIAGNOSIS — R5383 Other fatigue: Secondary | ICD-10-CM | POA: Diagnosis not present

## 2022-04-01 DIAGNOSIS — M0579 Rheumatoid arthritis with rheumatoid factor of multiple sites without organ or systems involvement: Secondary | ICD-10-CM | POA: Diagnosis not present

## 2022-04-01 DIAGNOSIS — Z79899 Other long term (current) drug therapy: Secondary | ICD-10-CM | POA: Diagnosis not present

## 2022-04-06 DIAGNOSIS — H04123 Dry eye syndrome of bilateral lacrimal glands: Secondary | ICD-10-CM | POA: Diagnosis not present

## 2022-04-06 DIAGNOSIS — M069 Rheumatoid arthritis, unspecified: Secondary | ICD-10-CM | POA: Diagnosis not present

## 2022-04-06 DIAGNOSIS — Z961 Presence of intraocular lens: Secondary | ICD-10-CM | POA: Diagnosis not present

## 2022-04-06 DIAGNOSIS — H401113 Primary open-angle glaucoma, right eye, severe stage: Secondary | ICD-10-CM | POA: Diagnosis not present

## 2022-04-06 DIAGNOSIS — H43813 Vitreous degeneration, bilateral: Secondary | ICD-10-CM | POA: Diagnosis not present

## 2022-04-06 DIAGNOSIS — H401122 Primary open-angle glaucoma, left eye, moderate stage: Secondary | ICD-10-CM | POA: Diagnosis not present

## 2022-05-17 ENCOUNTER — Other Ambulatory Visit: Payer: Self-pay | Admitting: Family Medicine

## 2022-06-08 ENCOUNTER — Encounter: Payer: Medicare Other | Admitting: Family Medicine

## 2022-07-01 ENCOUNTER — Other Ambulatory Visit: Payer: Self-pay | Admitting: Family Medicine

## 2022-07-02 DIAGNOSIS — Z79899 Other long term (current) drug therapy: Secondary | ICD-10-CM | POA: Diagnosis not present

## 2022-07-02 DIAGNOSIS — R5383 Other fatigue: Secondary | ICD-10-CM | POA: Diagnosis not present

## 2022-07-02 DIAGNOSIS — M0579 Rheumatoid arthritis with rheumatoid factor of multiple sites without organ or systems involvement: Secondary | ICD-10-CM | POA: Diagnosis not present

## 2022-07-03 DIAGNOSIS — M069 Rheumatoid arthritis, unspecified: Secondary | ICD-10-CM | POA: Diagnosis not present

## 2022-07-03 DIAGNOSIS — G629 Polyneuropathy, unspecified: Secondary | ICD-10-CM | POA: Diagnosis not present

## 2022-07-03 DIAGNOSIS — K760 Fatty (change of) liver, not elsewhere classified: Secondary | ICD-10-CM | POA: Diagnosis not present

## 2022-07-03 DIAGNOSIS — Z0000 Encounter for general adult medical examination without abnormal findings: Secondary | ICD-10-CM | POA: Diagnosis not present

## 2022-07-03 DIAGNOSIS — I10 Essential (primary) hypertension: Secondary | ICD-10-CM | POA: Diagnosis not present

## 2022-07-03 DIAGNOSIS — E785 Hyperlipidemia, unspecified: Secondary | ICD-10-CM | POA: Diagnosis not present

## 2022-07-03 DIAGNOSIS — M810 Age-related osteoporosis without current pathological fracture: Secondary | ICD-10-CM | POA: Diagnosis not present

## 2022-07-03 DIAGNOSIS — N3281 Overactive bladder: Secondary | ICD-10-CM | POA: Diagnosis not present

## 2022-07-03 DIAGNOSIS — K5909 Other constipation: Secondary | ICD-10-CM | POA: Diagnosis not present

## 2022-07-03 DIAGNOSIS — M159 Polyosteoarthritis, unspecified: Secondary | ICD-10-CM | POA: Diagnosis not present

## 2022-08-23 ENCOUNTER — Other Ambulatory Visit: Payer: Self-pay | Admitting: Family Medicine

## 2022-08-25 ENCOUNTER — Other Ambulatory Visit: Payer: Self-pay | Admitting: Family Medicine

## 2022-09-13 ENCOUNTER — Other Ambulatory Visit: Payer: Self-pay | Admitting: Family Medicine

## 2022-09-29 ENCOUNTER — Other Ambulatory Visit: Payer: Self-pay | Admitting: Family Medicine

## 2023-02-14 ENCOUNTER — Other Ambulatory Visit: Payer: Self-pay | Admitting: Family Medicine

## 2023-02-24 ENCOUNTER — Emergency Department (HOSPITAL_COMMUNITY)
Admission: EM | Admit: 2023-02-24 | Discharge: 2023-02-24 | Disposition: A | Discharge status: Home / Self Care | Payer: 59 | Attending: Emergency Medicine | Admitting: Emergency Medicine

## 2023-02-24 ENCOUNTER — Other Ambulatory Visit: Payer: Self-pay

## 2023-02-24 DIAGNOSIS — N3000 Acute cystitis without hematuria: Secondary | ICD-10-CM | POA: Diagnosis not present

## 2023-02-24 DIAGNOSIS — Z7982 Long term (current) use of aspirin: Secondary | ICD-10-CM | POA: Diagnosis not present

## 2023-02-24 DIAGNOSIS — R300 Dysuria: Secondary | ICD-10-CM | POA: Diagnosis present

## 2023-02-24 LAB — CBC WITH DIFFERENTIAL/PLATELET
Abs Immature Granulocytes: 0.01 10*3/uL (ref 0.00–0.07)
Basophils Absolute: 0 10*3/uL (ref 0.0–0.1)
Basophils Relative: 1 %
Eosinophils Absolute: 0 10*3/uL (ref 0.0–0.5)
Eosinophils Relative: 1 %
HCT: 30.1 % — ABNORMAL LOW (ref 36.0–46.0)
Hemoglobin: 9.3 g/dL — ABNORMAL LOW (ref 12.0–15.0)
Immature Granulocytes: 0 %
Lymphocytes Relative: 25 %
Lymphs Abs: 1.1 10*3/uL (ref 0.7–4.0)
MCH: 30.2 pg (ref 26.0–34.0)
MCHC: 30.9 g/dL (ref 30.0–36.0)
MCV: 97.7 fL (ref 80.0–100.0)
Monocytes Absolute: 0.4 10*3/uL (ref 0.1–1.0)
Monocytes Relative: 10 %
Neutro Abs: 2.7 10*3/uL (ref 1.7–7.7)
Neutrophils Relative %: 63 %
Platelets: 227 10*3/uL (ref 150–400)
RBC: 3.08 MIL/uL — ABNORMAL LOW (ref 3.87–5.11)
RDW: 14.7 % (ref 11.5–15.5)
WBC: 4.2 10*3/uL (ref 4.0–10.5)
nRBC: 0 % (ref 0.0–0.2)

## 2023-02-24 LAB — URINALYSIS, W/ REFLEX TO CULTURE (INFECTION SUSPECTED)
Bilirubin Urine: NEGATIVE
Glucose, UA: NEGATIVE mg/dL
Ketones, ur: NEGATIVE mg/dL
Nitrite: POSITIVE — AB
Protein, ur: NEGATIVE mg/dL
Specific Gravity, Urine: 1.025 (ref 1.005–1.030)
WBC, UA: >50 WBC/hpf (ref 0–5)
pH: 6 (ref 5.0–8.0)

## 2023-02-24 LAB — BASIC METABOLIC PANEL
Anion gap: 12 (ref 5–15)
BUN: 19 mg/dL (ref 8–23)
CO2: 26 mmol/L (ref 22–32)
Calcium: 9.3 mg/dL (ref 8.9–10.3)
Chloride: 103 mmol/L (ref 98–111)
Creatinine, Ser: 1.04 mg/dL — ABNORMAL HIGH (ref 0.44–1.00)
GFR, Estimated: 56 mL/min — ABNORMAL LOW (ref >60)
Glucose, Bld: 99 mg/dL (ref 70–99)
Potassium: 3.3 mmol/L — ABNORMAL LOW (ref 3.5–5.1)
Sodium: 141 mmol/L (ref 135–145)

## 2023-02-24 MED ORDER — CEPHALEXIN 500 MG PO CAPS: 500.0000 mg | ORAL_CAPSULE | Freq: Four times a day (QID) | ORAL | 0 refills | Status: DC

## 2023-02-24 MED ORDER — POTASSIUM CHLORIDE CRYS ER 20 MEQ PO TBCR
20.0000 meq | EXTENDED_RELEASE_TABLET | Freq: Once | ORAL | Status: AC
  Administered 2023-02-24: 20 meq via ORAL
  Filled 2023-02-24: qty 1

## 2023-02-24 MED ORDER — FLUCONAZOLE 150 MG PO TABS: 150.0000 mg | ORAL_TABLET | Freq: Every day | ORAL | 0 refills | Status: AC

## 2023-02-24 NOTE — ED Provider Notes (Signed)
[place] EMERGENCY DEPARTMENT AT [facility] Provider Note   CSN: [id number] Arrival date & time: 02/24/23  0615     History  Chief Complaint  Patient presents with   Dysuria    Brittany Stark is a 77 y.o. female.  77 year old female with prior medical history as detailed below presents for evaluation.  Patient reports persistent dysuria x 2 weeks.  She reports that UA obtained in her PCPs office last week was without evidence of infection.  Patient reports of burning and irritation of the skin on her perineum.  He denies fever.  She denies abdominal pain.  She denies other complaint.  The history is provided by the patient and medical records.       Home Medications Prior to Admission medications   Medication Sig Start Date End Date Taking? Authorizing Provider  acetaminophen (TYLENOL) 500 MG tablet Take 500 mg by mouth every 6 (six) hours as needed. 2 in am    [provider]  ASPIRIN 81 PO Take 1 tablet by mouth daily.    [provider]  BIOTIN PO Take 1 capsule by mouth daily.    [provider]  COMBIGAN 0.2-0.5 % ophthalmic solution INT 1 GTT IN OU BID 07/20/18   [provider]  escitalopram (LEXAPRO) 10 MG tablet TAKE 1 TABLET(10 MG) BY MOUTH DAILY 01/16/22   Willow Ora, MD  estradiol (ESTRACE) 0.1 MG/GM vaginal cream Place vaginally. 02/13/21   [provider]  gabapentin (NEURONTIN) 300 MG capsule Take 1 capsule (300 mg total) by mouth at bedtime. 04/16/21   Willow Ora, MD  hydrochlorothiazide (HYDRODIURIL) 25 MG tablet TAKE 1 TABLET(25 MG) BY MOUTH DAILY AS NEEDED FOR SWELLING 07/01/22   Willow Ora, MD  omeprazole (PRILOSEC) 20 MG capsule TAKE 1 CAPSULE(20 MG) BY MOUTH DAILY 11/12/16   Hilarie Fredrickson, MD  oxybutynin (DITROPAN-XL) 10 MG 24 hr tablet Take 20 mg by mouth at bedtime. 12/04/20   Willow Ora, MD  potassium chloride SA (KLOR-CON M) 20 MEQ tablet Take 1 tablet (20 mEq total) by mouth  daily. 05/19/22   Willow Ora, MD  rosuvastatin (CRESTOR) 10 MG tablet TAKE 1 TABLET(10 MG) BY MOUTH DAILY 08/25/22   Willow Ora, MD  Travoprost, BAK Free, (TRAVATAN) 0.004 % SOLN ophthalmic solution Place 1 drop into both eyes at bedtime.    [provider]  traZODone (DESYREL) 50 MG tablet Take 0.5-1 tablets (25-50 mg total) by mouth at bedtime as needed for sleep. 08/26/20   Willow Ora, MD  Upadacitinib (RINVOQ PO) Rinvoq    [provider]      Allergies    Alendronate, Amoxicillin, Arava [leflunomide], Doxycycline, and Methotrexate derivatives    Review of Systems   Review of Systems  All other systems reviewed and are negative.   Physical Exam Updated Vital Signs BP 108/71 (BP Location: Right Arm)   Pulse (!) 56   Temp 97.7 F (36.5 C) (Oral)   Resp 19   SpO2 100%  Physical Exam Vitals and nursing note reviewed.  Constitutional:      General: She is not in acute distress.    Appearance: Normal appearance. She is well-developed.  HENT:     Head: Normocephalic and atraumatic.  Eyes:     Conjunctiva/sclera: Conjunctivae normal.     Pupils: Pupils are equal, round, and reactive to light.  Cardiovascular:     Rate and Rhythm: Normal rate and regular rhythm.  Heart sounds: Normal heart sounds.  Pulmonary:     Effort: Pulmonary effort is normal. No respiratory distress.     Breath sounds: Normal breath sounds.  Abdominal:     General: There is no distension.     Palpations: Abdomen is soft.     Tenderness: There is no abdominal tenderness.  Genitourinary:    Comments: Mild erythematous irritation of the tissue surrounding the urethra at the perineum.  No discrete rash or other lesions noted. Musculoskeletal:        General: No deformity. Normal range of motion.     Cervical back: Normal range of motion and neck supple.  Skin:    General: Skin is warm and dry.  Neurological:     General: No focal deficit present.     Mental Status:  She is alert and oriented to person, place, and time.     ED Results / Procedures / Treatments   Labs (all labs ordered are listed, but only abnormal results are displayed) Labs Reviewed  URINALYSIS, W/ REFLEX TO CULTURE (INFECTION SUSPECTED)  CBC WITH DIFFERENTIAL/PLATELET  BASIC METABOLIC PANEL    EKG None  Radiology No results found.  Procedures Procedures    Medications Ordered in ED Medications - No data to display  ED Course/ Medical Decision Making/ A&P                             Medical Decision Making Amount and/or Complexity of Data Reviewed Labs: ordered.  Risk Prescription drug management.    Medical Screen Complete  This patient presented to the ED with complaint of dysuria.  This complaint involves an extensive number of treatment options. The initial differential diagnosis includes, but is not limited to, UTI, etc.  This presentation is: Acute, Self-Limited, Previously Undiagnosed, Uncertain Prognosis, Complicated, Systemic Symptoms, and Threat to Life/Bodily Function  Patient is presenting with dysuria.  Patient describes symptoms are most consistent with likely UTI.  Patient may have an element of localized irritation of her perineum exacerbating her symptoms.  However, patient would benefit from course of antibiotics to treat UTI.  Additionally patient provided with both instructions on how to care for perineum to avoid irritation.  Patient provided also with one-time dose of Diflucan if required after development of yeast infection associated with antibiotic use.  Patient feels improved after ED evaluation.  Importance of close follow-up stressed.  Strict return precautions given understood.  Additional history obtained: External records from outside sources obtained and reviewed including prior ED visits and prior Inpatient records.    Lab Tests:  I ordered and personally interpreted labs.   Problem List / ED Course:  Dysuria,  UTI   Reevaluation:  After the interventions noted above, I reevaluated the patient and found that they have: improved Disposition:  After consideration of the diagnostic results and the patients response to treatment, I feel that the patent would benefit from close outpatient follow-up.          Final Clinical Impression(s) / ED Diagnoses Final diagnoses:  Dysuria  Acute cystitis without hematuria    Rx / DC Orders ED Discharge Orders          Ordered    cephALEXin (KEFLEX) 500 MG capsule  4 times daily        02/24/23 1103    fluconazole (DIFLUCAN) 150 MG tablet  Daily        02/24/23 1103  Wynetta Fines, MD 02/24/23 [phone]

## 2023-02-24 NOTE — ED Triage Notes (Signed)
Patient reports dysuria onset 2 weeks ago , denies hematuria or fever .

## 2023-02-24 NOTE — Discharge Instructions (Addendum)
Return for any problem.   Apply Desitin to your perineum as instructed.  Take all of Keflex as prescribed.  If you develop symptoms of yeast infection use Diflucan as prescribed.

## 2023-02-25 LAB — URINE CULTURE

## 2023-02-26 LAB — URINE CULTURE: Culture: >=100000 — AB

## 2023-02-27 ENCOUNTER — Telehealth (HOSPITAL_BASED_OUTPATIENT_CLINIC_OR_DEPARTMENT_OTHER): Payer: Self-pay

## 2023-02-27 NOTE — Telephone Encounter (Signed)
Post ED Visit - Positive Culture Follow-up  Culture report reviewed by antimicrobial stewardship pharmacist: [facility] [x]  Estill Batten, Pharm.D. BCCCP []  Celedonio Miyamoto, [place].D., BCPS AQ-ID []  Garvin Fila, Pharm.D., BCPS []  Georgina Pillion, [address].D., BCPS []  [place], [address].D., BCPS, AAHIVP []  Estella Husk, Pharm.D., BCPS, AAHIVP []  Lysle Pearl, PharmD, BCPS []  Phillips Climes, PharmD, BCPS []  Agapito Games, PharmD, BCPS []  Verlan Friends, PharmD []  Mervyn Gay, PharmD, BCPS []  Vinnie Level, PharmD  [facility] []  Len Childs, PharmD []  Greer Pickerel, PharmD []  Adalberto Cole, PharmD []  Perlie Gold, Rph []  Lonell Face) Jean Rosenthal, PharmD []  Earl Many, PharmD []  Junita Push, PharmD []  Dorna Leitz, PharmD []  Terrilee Files, PharmD []  Lynann Beaver, PharmD []  Keturah Barre, PharmD []  Loralee Pacas, PharmD []  Bernadene Person, PharmD   Positive urine culture Treated with Cephalexin and Fluconazole, organism sensitive to the same and no further patient follow-up is required at this time.  Sandria Senter 02/27/2023, 1:26 PM

## 2023-05-19 ENCOUNTER — Other Ambulatory Visit: Payer: Self-pay | Admitting: Family Medicine

## 2023-10-11 ENCOUNTER — Encounter (HOSPITAL_COMMUNITY): Payer: Self-pay | Admitting: Emergency Medicine

## 2023-10-11 ENCOUNTER — Emergency Department (HOSPITAL_COMMUNITY)
Admission: EM | Admit: 2023-10-11 | Discharge: 2023-10-11 | Disposition: A | Discharge status: Home / Self Care | Payer: 59 | Attending: Emergency Medicine | Admitting: Emergency Medicine

## 2023-10-11 ENCOUNTER — Other Ambulatory Visit: Payer: Self-pay

## 2023-10-11 ENCOUNTER — Emergency Department (HOSPITAL_COMMUNITY): Payer: 59

## 2023-10-11 DIAGNOSIS — R319 Hematuria, unspecified: Secondary | ICD-10-CM | POA: Insufficient documentation

## 2023-10-11 DIAGNOSIS — M25559 Pain in unspecified hip: Secondary | ICD-10-CM | POA: Diagnosis not present

## 2023-10-11 DIAGNOSIS — R1032 Left lower quadrant pain: Secondary | ICD-10-CM | POA: Diagnosis present

## 2023-10-11 DIAGNOSIS — Z79899 Other long term (current) drug therapy: Secondary | ICD-10-CM | POA: Diagnosis not present

## 2023-10-11 DIAGNOSIS — I10 Essential (primary) hypertension: Secondary | ICD-10-CM | POA: Insufficient documentation

## 2023-10-11 DIAGNOSIS — Z7982 Long term (current) use of aspirin: Secondary | ICD-10-CM | POA: Diagnosis not present

## 2023-10-11 DIAGNOSIS — N390 Urinary tract infection, site not specified: Secondary | ICD-10-CM | POA: Diagnosis not present

## 2023-10-11 DIAGNOSIS — E876 Hypokalemia: Secondary | ICD-10-CM | POA: Diagnosis not present

## 2023-10-11 LAB — BASIC METABOLIC PANEL
Anion gap: 10 (ref 5–15)
BUN: 16 mg/dL (ref 8–23)
CO2: 26 mmol/L (ref 22–32)
Calcium: 9.2 mg/dL (ref 8.9–10.3)
Chloride: 105 mmol/L (ref 98–111)
Creatinine, Ser: 0.8 mg/dL (ref 0.44–1.00)
GFR, Estimated: >60 mL/min (ref >60)
Glucose, Bld: 142 mg/dL — ABNORMAL HIGH (ref 70–99)
Potassium: 2.9 mmol/L — ABNORMAL LOW (ref 3.5–5.1)
Sodium: 141 mmol/L (ref 135–145)

## 2023-10-11 LAB — CBC WITH DIFFERENTIAL/PLATELET
Abs Immature Granulocytes: 0 10*3/uL (ref 0.00–0.07)
Basophils Absolute: 0 10*3/uL (ref 0.0–0.1)
Basophils Relative: 1 %
Eosinophils Absolute: 0 10*3/uL (ref 0.0–0.5)
Eosinophils Relative: 0 %
HCT: 33.4 % — ABNORMAL LOW (ref 36.0–46.0)
Hemoglobin: 10.8 g/dL — ABNORMAL LOW (ref 12.0–15.0)
Immature Granulocytes: 0 %
Lymphocytes Relative: 24 %
Lymphs Abs: 0.8 10*3/uL (ref 0.7–4.0)
MCH: 33.3 pg (ref 26.0–34.0)
MCHC: 32.3 g/dL (ref 30.0–36.0)
MCV: 103.1 fL — ABNORMAL HIGH (ref 80.0–100.0)
Monocytes Absolute: 0.4 10*3/uL (ref 0.1–1.0)
Monocytes Relative: 12 %
Neutro Abs: 2.1 10*3/uL (ref 1.7–7.7)
Neutrophils Relative %: 63 %
Platelets: 187 10*3/uL (ref 150–400)
RBC: 3.24 MIL/uL — ABNORMAL LOW (ref 3.87–5.11)
RDW: 14.6 % (ref 11.5–15.5)
WBC: 3.4 10*3/uL — ABNORMAL LOW (ref 4.0–10.5)
nRBC: 0 % (ref 0.0–0.2)

## 2023-10-11 LAB — URINALYSIS, W/ REFLEX TO CULTURE (INFECTION SUSPECTED)
Bilirubin Urine: NEGATIVE
Glucose, UA: NEGATIVE mg/dL
Ketones, ur: NEGATIVE mg/dL
Nitrite: NEGATIVE
Protein, ur: NEGATIVE mg/dL
Specific Gravity, Urine: 1.027 (ref 1.005–1.030)
pH: 5 (ref 5.0–8.0)

## 2023-10-11 LAB — I-STAT CHEM 8, ED
BUN: 19 mg/dL (ref 8–23)
BUN: 18 mg/dL (ref 8–23)
Calcium, Ion: 1.07 mmol/L — ABNORMAL LOW (ref 1.15–1.40)
Calcium, Ion: 1.06 mmol/L — ABNORMAL LOW (ref 1.15–1.40)
Chloride: 104 mmol/L (ref 98–111)
Chloride: 107 mmol/L (ref 98–111)
Creatinine, Ser: 0.8 mg/dL (ref 0.44–1.00)
Creatinine, Ser: 0.7 mg/dL (ref 0.44–1.00)
Glucose, Bld: 143 mg/dL — ABNORMAL HIGH (ref 70–99)
Glucose, Bld: 81 mg/dL (ref 70–99)
HCT: 35 % — ABNORMAL LOW (ref 36.0–46.0)
HCT: 32 % — ABNORMAL LOW (ref 36.0–46.0)
Hemoglobin: 11.9 g/dL — ABNORMAL LOW (ref 12.0–15.0)
Hemoglobin: 10.9 g/dL — ABNORMAL LOW (ref 12.0–15.0)
Potassium: 2.8 mmol/L — ABNORMAL LOW (ref 3.5–5.1)
Potassium: 3.8 mmol/L (ref 3.5–5.1)
Sodium: 144 mmol/L (ref 135–145)
Sodium: 144 mmol/L (ref 135–145)
TCO2: 29 mmol/L (ref 22–32)
TCO2: 29 mmol/L (ref 22–32)

## 2023-10-11 LAB — I-STAT CG4 LACTIC ACID, ED
Lactic Acid, Venous: 1.2 mmol/L (ref 0.5–1.9)
Lactic Acid, Venous: 0.9 mmol/L (ref 0.5–1.9)

## 2023-10-11 LAB — LIPASE, BLOOD: Lipase: 38 U/L (ref 11–51)

## 2023-10-11 LAB — MAGNESIUM: Magnesium: 1.9 mg/dL (ref 1.7–2.4)

## 2023-10-11 MED ORDER — POTASSIUM CHLORIDE 10 MEQ/100ML IV SOLN
10.0000 meq | INTRAVENOUS | Status: DC
  Administered 2023-10-11: 10 meq via INTRAVENOUS
  Filled 2023-10-11: qty 100

## 2023-10-11 MED ORDER — POTASSIUM CHLORIDE CRYS ER 20 MEQ PO TBCR
40.0000 meq | EXTENDED_RELEASE_TABLET | Freq: Once | ORAL | Status: AC
  Administered 2023-10-11: 40 meq via ORAL
  Filled 2023-10-11: qty 2

## 2023-10-11 MED ORDER — CEPHALEXIN 500 MG PO CAPS: 500.0000 mg | ORAL_CAPSULE | Freq: Four times a day (QID) | ORAL | 0 refills | Status: AC

## 2023-10-11 MED ORDER — FENTANYL CITRATE PF 50 MCG/ML IJ SOSY
25.0000 ug | PREFILLED_SYRINGE | Freq: Once | INTRAMUSCULAR | Status: AC
  Administered 2023-10-11: 25 ug via INTRAVENOUS
  Filled 2023-10-11: qty 1

## 2023-10-11 MED ORDER — IOHEXOL 350 MG/ML SOLN
75.0000 mL | Freq: Once | INTRAVENOUS | Status: AC | PRN
  Administered 2023-10-11: 75 mL via INTRAVENOUS

## 2023-10-11 NOTE — ED Provider Triage Note (Signed)
Emergency Medicine Provider Triage Evaluation Note  Brittany Stark , a 78 y.o. female  was evaluated in triage.  Pt complains of left-sided groin pain.  She denies any injury or trauma.  She states it hurts when she got out of bed and with walking it was worse.  Patient has pain in the left groin with passive range of motion of the left hip.  No abdominal tenderness on my exam.  Denies nausea, vomiting, diarrhea  Review of Systems  Positive:  Negative:   Physical Exam  BP 119/62   Pulse (!) 55   Temp 98.2 F (36.8 C) (Oral)   Resp 15   SpO2 100%  Gen:   Awake, no distress   Resp:  Normal effort  MSK:   Moves extremities without difficulty  Other:    Medical Decision Making  Medically screening exam initiated at 6:09 AM.  Appropriate orders placed.  Brittany Stark was informed that the remainder of the evaluation will be completed by another provider, this initial triage assessment does not replace that evaluation, and the importance of remaining in the ED until their evaluation is complete.     Darrick Grinder, [place] 10/11/23 [phone]

## 2023-10-11 NOTE — ED Provider Notes (Signed)
[place] EMERGENCY DEPARTMENT AT [facility] Provider Note   CSN: [id number] Arrival date & time: 10/11/23  0549     History  Chief Complaint  Patient presents with   Abdominal Pain   Hip Pain    Brittany Stark is a 78 y.o. female.   Abdominal Pain Hip Pain Associated symptoms include abdominal pain.     78 year old female with medical history significant for depression, GERD, arthritis, HTN, HLD, kidney stones presenting to the emergency department with abdominal pain.  The patient states that she has been having severe pain in her left lower quadrant of her abdomen.  No fevers, chills, nausea or vomiting.  Her last bowel movement was yesterday and she has been having some difficulty having bowel movements.  She is not passing gas.  She denies any falls or trauma.  She states that pain has eased off some but is still present and moderate in intensity.  Home Medications Prior to Admission medications   Medication Sig Start Date End Date Taking? Authorizing Provider  acetaminophen (TYLENOL) 500 MG tablet Take 500 mg by mouth every 6 (six) hours as needed. 2 in am    [provider]  ASPIRIN 81 PO Take 1 tablet by mouth daily.    [provider]  BIOTIN PO Take 1 capsule by mouth daily.    [provider]  cephALEXin (KEFLEX) 500 MG capsule Take 1 capsule (500 mg total) by mouth 4 (four) times daily. 02/24/23   Wynetta Fines, MD  COMBIGAN 0.2-0.5 % ophthalmic solution INT 1 GTT IN OU BID 07/20/18   [provider]  escitalopram (LEXAPRO) 10 MG tablet TAKE 1 TABLET(10 MG) BY MOUTH DAILY 01/16/22   Willow Ora, MD  estradiol (ESTRACE) 0.1 MG/GM vaginal cream Place vaginally. 02/13/21   [provider]  gabapentin (NEURONTIN) 300 MG capsule Take 1 capsule (300 mg total) by mouth at bedtime. 04/16/21   Willow Ora, MD  hydrochlorothiazide (HYDRODIURIL) 25 MG tablet TAKE 1 TABLET(25 MG) BY MOUTH DAILY AS NEEDED FOR SWELLING  07/01/22   Willow Ora, MD  omeprazole (PRILOSEC) 20 MG capsule TAKE 1 CAPSULE(20 MG) BY MOUTH DAILY 11/12/16   Hilarie Fredrickson, MD  oxybutynin (DITROPAN-XL) 10 MG 24 hr tablet Take 20 mg by mouth at bedtime. 12/04/20   Willow Ora, MD  potassium chloride SA (KLOR-CON M) 20 MEQ tablet Take 1 tablet (20 mEq total) by mouth daily. 05/19/22   Willow Ora, MD  rosuvastatin (CRESTOR) 10 MG tablet TAKE 1 TABLET(10 MG) BY MOUTH DAILY 08/25/22   Willow Ora, MD  Travoprost, BAK Free, (TRAVATAN) 0.004 % SOLN ophthalmic solution Place 1 drop into both eyes at bedtime.    [provider]  traZODone (DESYREL) 50 MG tablet Take 0.5-1 tablets (25-50 mg total) by mouth at bedtime as needed for sleep. 08/26/20   Willow Ora, MD  Upadacitinib (RINVOQ PO) Rinvoq    [provider]      Allergies    Alendronate, Amoxicillin, Arava [leflunomide], Doxycycline, and Methotrexate derivatives    Review of Systems   Review of Systems  Gastrointestinal:  Positive for abdominal pain.  All other systems reviewed and are negative.   Physical Exam Updated Vital Signs BP 137/78 (BP Location: Left Arm)   Pulse 60   Temp 97.9 F (36.6 C) (Oral)   Resp 14   SpO2 100%  Physical Exam Vitals and nursing note reviewed.  Constitutional:  General: She is not in acute distress.    Appearance: She is well-developed.  HENT:     Head: Normocephalic and atraumatic.  Eyes:     Conjunctiva/sclera: Conjunctivae normal.  Cardiovascular:     Rate and Rhythm: Normal rate and regular rhythm.     Heart sounds: No murmur heard. Pulmonary:     Effort: Pulmonary effort is normal. No respiratory distress.     Breath sounds: Normal breath sounds.  Abdominal:     Palpations: Abdomen is soft.     Tenderness: There is abdominal tenderness in the left lower quadrant. There is no guarding or rebound.  Musculoskeletal:        General: No swelling.     Cervical back: Neck supple.  Skin:    General:  Skin is warm and dry.     Capillary Refill: Capillary refill takes less than 2 seconds.  Neurological:     Mental Status: She is alert.  Psychiatric:        Mood and Affect: Mood normal.     ED Results / Procedures / Treatments   Labs (all labs ordered are listed, but only abnormal results are displayed) Labs Reviewed  CBC WITH DIFFERENTIAL/PLATELET  BASIC METABOLIC PANEL  LIPASE, BLOOD  URINALYSIS, W/ REFLEX TO CULTURE (INFECTION SUSPECTED)  I-STAT CHEM 8, ED  I-STAT CG4 LACTIC ACID, ED    EKG None  Radiology DG Hip Unilat W or Wo Pelvis 2-3 Views Left Result Date: 10/11/2023 CLINICAL DATA:  78 year old female with history of left groin pain this morning. EXAM: DG HIP (WITH OR WITHOUT PELVIS) 2-3V LEFT COMPARISON:  No priors. FINDINGS: There is no evidence of hip fracture or dislocation. There is no evidence of arthropathy or other focal bone abnormality. IMPRESSION: Negative. Electronically Signed   By: Trudie Reed M.D.   On: 10/11/2023 06:31    Procedures Procedures    Medications Ordered in ED Medications  fentaNYL (SUBLIMAZE) injection 25 mcg (25 mcg Intravenous Given 10/11/23 1444)    ED Course/ Medical Decision Making/ A&P                                 Medical Decision Making Amount and/or Complexity of Data Reviewed Labs: ordered. Radiology: ordered.  Risk Prescription drug management.    78 year old female with medical history significant for depression, GERD, arthritis, HTN, HLD, kidney stones presenting to the emergency department with abdominal pain.  The patient states that she has been having severe pain in her left lower quadrant of her abdomen.  No fevers, chills, nausea or vomiting.  Her last bowel movement was yesterday and she has been having some difficulty having bowel movements.  She is not passing gas.  She denies any falls or trauma.  She states that pain has eased off some but is still present and moderate in intensity.  On arrival,  the patient was afebrile, bradycardic heart rate 55, not tachypneic, BP 119/62, saturating 100% on room air.  Patient presenting with left lower quadrant abdominal pain, decreased passage of gas.  On exam, the patient had left lower quadrant tenderness to palpation.  Differential diagnosis includes diverticulitis, UTI/pyelonephritis, small bowel obstruction, nephrolithiasis.  Of note, the patient was complaining of some arthritic pain in her hips as well in triage and a pelvis/hip x-ray was obtained which resulted negative.  Labs: pending  CT Abdomen Pelvis: pending  Signout given to Dr. Rosalia Hammers to follow-up results of  CT imaging. Pt administered Fentanyl for pain control.   Final Clinical Impression(s) / ED Diagnoses Final diagnoses:  LLQ abdominal pain    Rx / DC Orders ED Discharge Orders     None         Ernie Avena, MD 10/11/23 1516

## 2023-10-11 NOTE — ED Provider Notes (Addendum)
  Physical Exam  BP 137/78 (BP Location: Left Arm)   Pulse 60   Temp 97.9 F (36.6 C) (Oral)   Resp 14   SpO2 100%   Physical Exam  Procedures  Procedures  ED Course / MDM    Medical Decision Making Amount and/or Complexity of Data Reviewed Labs: ordered. Radiology: ordered.  Risk Prescription drug management.   78 yo female presented complaining of llq pain but also chronic left hip pain. X-Conception Doebler of hip ordered and nad CT of abdomen pending. Potassium 2,8-runs ordered. Magnesium pending CT without any evidence of acute abnormality Patient has received her potassium I have discussed findings with patient.  She understands that she should return if she has having worsening symptoms.  She is to continue her home potassium Patient has had some burning and pain with urination. Repeated potassium as patient did not want potassium runs due to pain and her ride is here.  Potassium has normalized and patient ready for d/c          [name], MD 10/11/23 Brittany Stark    [name], MD 10/11/23 2042

## 2023-10-11 NOTE — ED Notes (Signed)
Patient discharge paperwork review. Patient was reminded to take her at home Potassium. Patient verbalized understanding. IV removed.

## 2023-10-11 NOTE — Discharge Instructions (Addendum)
Please continue your home potassium and call your doctor for recheck of your potassium tomorrow Please pick up your antibiotic prescription and take those as prescribed Return if you are having any new or worsening symptoms.

## 2023-10-11 NOTE — ED Notes (Signed)
Patient states she do not want the remain IV Potassium and wants to go home. MD notified.

## 2023-10-11 NOTE — ED Triage Notes (Signed)
BIBA Per EMS: pt coming from home w/ c/o abd pain since 3am. LLQ. Denies nausea or vomiting.

## 2023-10-26 NOTE — Progress Notes (Unsigned)
GYNECOLOGY  VISIT  CC:   Pt reports that urine has an "odor". She is not sexually active and has no concern for STD. No vaginal discharge, burning, or irritation. Feels like the urine "has a smell to it". Patient reports hx of kidney stones. Has small amount blood in urine on dipstick today.  HPI: 78 y.o. No obstetric history on file. Divorced Brittany Stark female here for urinary odor.   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 Evaluated by Dr. Terrace Arabia, neuro 2020   Renal disorder     MEDS:   Current Outpatient Medications on File Prior to Visit  Medication Sig Dispense Refill   acetaminophen (TYLENOL) 500 MG tablet Take 500 mg by mouth every 6 (six) hours as needed. 2 in am     ASPIRIN 81 PO Take 1 tablet by mouth daily.     BIOTIN PO Take 1 capsule by mouth daily.     cephALEXin (KEFLEX) 500 MG capsule Take 1 capsule (500 mg total) by mouth 4 (four) times daily. 20 capsule 0   COMBIGAN 0.2-0.5 % ophthalmic solution INT 1 GTT IN OU BID  3   escitalopram (LEXAPRO) 10 MG tablet TAKE 1 TABLET(10 MG) BY MOUTH DAILY 60 tablet 0   estradiol (ESTRACE) 0.1 MG/GM vaginal cream Place vaginally.     gabapentin (NEURONTIN) 300 MG capsule Take 1 capsule (300 mg total) by mouth at bedtime. 90 capsule 11   hydrochlorothiazide (HYDRODIURIL) 25 MG tablet TAKE 1 TABLET(25 MG) BY MOUTH DAILY AS NEEDED FOR SWELLING 30 tablet 0   omeprazole (PRILOSEC) 20 MG capsule TAKE 1 CAPSULE(20 MG) BY  MOUTH DAILY 90 capsule 0   oxybutynin (DITROPAN-XL) 10 MG 24 hr tablet Take 20 mg by mouth at bedtime.     potassium chloride SA (KLOR-CON M) 20 MEQ tablet Take 1 tablet (20 mEq total) by mouth daily. 90 tablet 3   rosuvastatin (CRESTOR) 10 MG tablet TAKE 1 TABLET(10 MG) BY MOUTH DAILY 30 tablet 0   Travoprost, BAK Free, (TRAVATAN) 0.004 % SOLN ophthalmic solution Place 1 drop into both eyes at bedtime.     traZODone (DESYREL) 50 MG tablet Take 0.5-1 tablets (25-50 mg total) by mouth at bedtime as needed for sleep. 30 tablet 3   Upadacitinib (RINVOQ PO) Rinvoq     No current facility-administered medications on file prior to visit.    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 Off on Sundays. Has 2 children and grand-children.   Review of Systems  Constitutional:  Negative for chills and fever.  Genitourinary:        C/o "odor" to urine   PHYSICAL EXAMINATION:    BP 128/62 (BP Location: Right Arm, Patient Position: Sitting)   Pulse 82   Ht 5\' 9"  (1.753 m)   Wt 177 lb 9.6  oz (80.6 kg)   BMI 26.23 kg/m     General appearance: alert, cooperative and appears stated age  Pelvic: External genitalia:  no lesions              Urethra:  normal appearing urethra with no masses, tenderness or lesions              Bartholins and Skenes: normal                 Vagina: atrophic mucosa              Cervix: absent              Bimanual Exam:  Uterus:  uterus absent              Adnexa: no mass, fullness, tenderness              Chaperone, Hendricks Milo, CMA, was present for exam.  Assessment/Plan: 1. Vaginal or Urine Odor (Primary) - Pt thinks odor is "urine" but could be vaginal odor - No vaginal odor noted today - Cervicovaginal ancillary only( [place])  2. Pelvic pain - Recent CT 10/11/23 showed no adnexal masses - Small amt hematuria on  urine dipstick - Urine Culture  Brittany Stark

## 2023-10-27 ENCOUNTER — Other Ambulatory Visit (HOSPITAL_COMMUNITY)
Admission: RE | Admit: 2023-10-27 | Discharge: 2023-10-27 | Disposition: A | Discharge status: Home / Self Care | Payer: 59 | Source: Ambulatory Visit | Attending: Certified Nurse Midwife | Admitting: Certified Nurse Midwife

## 2023-10-27 ENCOUNTER — Encounter (HOSPITAL_BASED_OUTPATIENT_CLINIC_OR_DEPARTMENT_OTHER): Payer: Self-pay | Admitting: Certified Nurse Midwife

## 2023-10-27 ENCOUNTER — Ambulatory Visit (INDEPENDENT_AMBULATORY_CARE_PROVIDER_SITE_OTHER): Payer: 59 | Admitting: Certified Nurse Midwife

## 2023-10-27 VITALS — BP 128/62 | HR 82 | Ht 69.0 in | Wt 177.6 lb

## 2023-10-27 DIAGNOSIS — N898 Other specified noninflammatory disorders of vagina: Secondary | ICD-10-CM | POA: Diagnosis not present

## 2023-10-27 DIAGNOSIS — R8290 Unspecified abnormal findings in urine: Secondary | ICD-10-CM | POA: Diagnosis not present

## 2023-10-27 DIAGNOSIS — R102 Pelvic and perineal pain unspecified side: Secondary | ICD-10-CM

## 2023-10-28 DIAGNOSIS — R8290 Unspecified abnormal findings in urine: Secondary | ICD-10-CM | POA: Insufficient documentation

## 2023-10-29 LAB — URINE CULTURE

## 2023-10-29 LAB — CERVICOVAGINAL ANCILLARY ONLY
Bacterial Vaginitis (gardnerella): NEGATIVE
Comment: NEGATIVE

## 2024-03-06 ENCOUNTER — Other Ambulatory Visit: Payer: Self-pay | Admitting: Internal Medicine

## 2024-03-06 DIAGNOSIS — Z1231 Encounter for screening mammogram for malignant neoplasm of breast: Secondary | ICD-10-CM

## 2024-03-07 ENCOUNTER — Ambulatory Visit
Admission: RE | Admit: 2024-03-07 | Discharge: 2024-03-07 | Disposition: A | Discharge status: Home / Self Care | Source: Ambulatory Visit | Attending: Internal Medicine | Admitting: Internal Medicine

## 2024-03-07 DIAGNOSIS — Z1231 Encounter for screening mammogram for malignant neoplasm of breast: Secondary | ICD-10-CM

## 2024-11-28 ENCOUNTER — Ambulatory Visit (HOSPITAL_BASED_OUTPATIENT_CLINIC_OR_DEPARTMENT_OTHER): Admitting: Family Medicine
